# Patient Record
Sex: Female | Born: 1940 | Race: White | Hispanic: No | Marital: Single | Smoking: Current every day smoker
Health system: Southern US, Community
[De-identification: ages and names within clinical notes are randomized; demographics above are authoritative.]

## PROBLEM LIST (undated history)

## (undated) DIAGNOSIS — F0390 Unspecified dementia without behavioral disturbance: Secondary | ICD-10-CM

## (undated) DIAGNOSIS — E119 Type 2 diabetes mellitus without complications: Secondary | ICD-10-CM

## (undated) HISTORY — PX: ABDOMINAL HYSTERECTOMY: SHX81

## (undated) HISTORY — PX: CHOLECYSTECTOMY: SHX55

## (undated) HISTORY — PX: APPENDECTOMY: SHX54

---

## 2018-10-30 ENCOUNTER — Encounter: Payer: Self-pay | Admitting: Internal Medicine

## 2018-11-05 ENCOUNTER — Other Ambulatory Visit (HOSPITAL_COMMUNITY): Payer: Self-pay | Admitting: Family Medicine

## 2018-11-05 DIAGNOSIS — Z136 Encounter for screening for cardiovascular disorders: Secondary | ICD-10-CM

## 2018-12-10 ENCOUNTER — Other Ambulatory Visit (HOSPITAL_COMMUNITY)
Admission: RE | Admit: 2018-12-10 | Discharge: 2018-12-10 | Disposition: A | Discharge status: Home / Self Care | Payer: Self-pay | Source: Other Acute Inpatient Hospital | Attending: Internal Medicine | Admitting: Internal Medicine

## 2018-12-10 DIAGNOSIS — I480 Paroxysmal atrial fibrillation: Secondary | ICD-10-CM | POA: Insufficient documentation

## 2018-12-10 DIAGNOSIS — Z7901 Long term (current) use of anticoagulants: Secondary | ICD-10-CM | POA: Insufficient documentation

## 2018-12-10 LAB — PROTIME-INR
INR: 1.4
PROTHROMBIN TIME: 17 s — AB (ref 11.4–15.2)

## 2018-12-22 ENCOUNTER — Other Ambulatory Visit (HOSPITAL_COMMUNITY)
Admission: AD | Admit: 2018-12-22 | Discharge: 2018-12-22 | Disposition: A | Discharge status: Home / Self Care | Payer: Self-pay | Source: Other Acute Inpatient Hospital | Attending: Internal Medicine | Admitting: Internal Medicine

## 2018-12-22 DIAGNOSIS — Z7901 Long term (current) use of anticoagulants: Secondary | ICD-10-CM | POA: Insufficient documentation

## 2018-12-22 LAB — PROTIME-INR
INR: 2.57
Prothrombin Time: 27.2 seconds — ABNORMAL HIGH (ref 11.4–15.2)

## 2019-01-02 ENCOUNTER — Other Ambulatory Visit (HOSPITAL_COMMUNITY)
Admission: RE | Admit: 2019-01-02 | Discharge: 2019-01-02 | Disposition: A | Discharge status: Home / Self Care | Payer: 59 | Source: Ambulatory Visit | Attending: Internal Medicine | Admitting: Internal Medicine

## 2019-01-02 DIAGNOSIS — Z029 Encounter for administrative examinations, unspecified: Secondary | ICD-10-CM | POA: Diagnosis present

## 2019-01-02 LAB — PROTIME-INR
INR: 1.2
PROTHROMBIN TIME: 15 s (ref 11.4–15.2)

## 2019-01-30 ENCOUNTER — Encounter: Payer: Self-pay | Admitting: Nurse Practitioner

## 2019-01-30 ENCOUNTER — Ambulatory Visit: Payer: Self-pay | Admitting: Nurse Practitioner

## 2019-01-30 NOTE — Progress Notes (Deleted)
Primary Care Physician:  Pearson Grippe, MD Primary Gastroenterologist:  Dr. Jena Gauss  No chief complaint on file.   HPI:   Kathleen Barnett is a 78 y.o. female who presents on referral from primary care to schedule colonoscopy.  Nurse/phone triage was deferred to office visit due to Coumadin.  Reviewed information provided with the referral including ***.  No history of previous colonoscopy in our system.  Today she states   No past medical history on file.  *** The histories are not reviewed yet. Please review them in the "History" navigator section and refresh this SmartLink.  No current outpatient medications on file.   No current facility-administered medications for this visit.     Allergies as of 01/30/2019  . (Not on File)    No family history on file.  Social History   Socioeconomic History  . Marital status: Unknown    Spouse name: Not on file  . Number of children: Not on file  . Years of education: Not on file  . Highest education level: Not on file  Occupational History  . Not on file  Social Needs  . Financial resource strain: Not on file  . Food insecurity:    Worry: Not on file    Inability: Not on file  . Transportation needs:    Medical: Not on file    Non-medical: Not on file  Tobacco Use  . Smoking status: Not on file  Substance and Sexual Activity  . Alcohol use: Not on file  . Drug use: Not on file  . Sexual activity: Not on file  Lifestyle  . Physical activity:    Days per week: Not on file    Minutes per session: Not on file  . Stress: Not on file  Relationships  . Social connections:    Talks on phone: Not on file    Gets together: Not on file    Attends religious service: Not on file    Active member of club or organization: Not on file    Attends meetings of clubs or organizations: Not on file    Relationship status: Not on file  . Intimate partner violence:    Fear of current or ex partner: Not on file    Emotionally abused:  Not on file    Physically abused: Not on file    Forced sexual activity: Not on file  Other Topics Concern  . Not on file  Social History Narrative  . Not on file    Review of Systems: General: Negative for anorexia, weight loss, fever, chills, fatigue, weakness. Eyes: Negative for vision changes.  ENT: Negative for hoarseness, difficulty swallowing , nasal congestion. CV: Negative for chest pain, angina, palpitations, dyspnea on exertion, peripheral edema.  Respiratory: Negative for dyspnea at rest, dyspnea on exertion, cough, sputum, wheezing.  GI: See history of present illness. GU:  Negative for dysuria, hematuria, urinary incontinence, urinary frequency, nocturnal urination.  MS: Negative for joint pain, low back pain.  Derm: Negative for rash or itching.  Neuro: Negative for weakness, abnormal sensation, seizure, frequent headaches, memory loss, confusion.  Psych: Negative for anxiety, depression, suicidal ideation, hallucinations.  Endo: Negative for unusual weight change.  Heme: Negative for bruising or bleeding. Allergy: Negative for rash or hives.    Physical Exam: There were no vitals taken for this visit. General:   Alert and oriented. Pleasant and cooperative. Well-nourished and well-developed.  Head:  Normocephalic and atraumatic. Eyes:  Without icterus, sclera  clear and conjunctiva pink.  Ears:  Normal auditory acuity. Mouth:  No deformity or lesions, oral mucosa pink.  Throat/Neck:  Supple, without mass or thyromegaly. Cardiovascular:  S1, S2 present without murmurs appreciated. Normal pulses noted. Extremities without clubbing or edema. Respiratory:  Clear to auscultation bilaterally. No wheezes, rales, or rhonchi. No distress.  Gastrointestinal:  +BS, soft, non-tender and non-distended. No HSM noted. No guarding or rebound. No masses appreciated.  Rectal:  Deferred  Musculoskalatal:  Symmetrical without gross deformities. Normal posture. Skin:  Intact without  significant lesions or rashes. Neurologic:  Alert and oriented x4;  grossly normal neurologically. Psych:  Alert and cooperative. Normal mood and affect. Heme/Lymph/Immune: No significant cervical adenopathy. No excessive bruising noted.    01/30/2019 7:55 AM   Disclaimer: This note was dictated with voice recognition software. Similar sounding words can inadvertently be transcribed and may not be corrected upon review.

## 2019-02-04 ENCOUNTER — Telehealth: Payer: Self-pay

## 2019-02-04 NOTE — Telephone Encounter (Signed)
Pt called due to receiving a letter about a missed appointment. Pt wasn't aware that she had an appointment and wants to r/s. Call [phone]. Pt wasn't sure of her phone number but asked me to call the number back that she called from. Pt will call back if she hasn't heard from our office.

## 2019-08-09 ENCOUNTER — Emergency Department (HOSPITAL_COMMUNITY)
Admission: EM | Admit: 2019-08-09 | Discharge: 2019-08-09 | Disposition: A | Discharge status: Home / Self Care | Payer: Medicare Other | Attending: Emergency Medicine | Admitting: Emergency Medicine

## 2019-08-09 ENCOUNTER — Encounter (HOSPITAL_COMMUNITY): Payer: Self-pay | Admitting: Emergency Medicine

## 2019-08-09 ENCOUNTER — Other Ambulatory Visit: Payer: Self-pay

## 2019-08-09 ENCOUNTER — Emergency Department (HOSPITAL_COMMUNITY): Payer: Medicare Other

## 2019-08-09 DIAGNOSIS — Z7984 Long term (current) use of oral hypoglycemic drugs: Secondary | ICD-10-CM | POA: Insufficient documentation

## 2019-08-09 DIAGNOSIS — Z79899 Other long term (current) drug therapy: Secondary | ICD-10-CM | POA: Diagnosis not present

## 2019-08-09 DIAGNOSIS — R002 Palpitations: Secondary | ICD-10-CM | POA: Insufficient documentation

## 2019-08-09 DIAGNOSIS — F17210 Nicotine dependence, cigarettes, uncomplicated: Secondary | ICD-10-CM | POA: Diagnosis not present

## 2019-08-09 DIAGNOSIS — N3000 Acute cystitis without hematuria: Secondary | ICD-10-CM | POA: Diagnosis not present

## 2019-08-09 DIAGNOSIS — R42 Dizziness and giddiness: Secondary | ICD-10-CM

## 2019-08-09 DIAGNOSIS — E119 Type 2 diabetes mellitus without complications: Secondary | ICD-10-CM | POA: Insufficient documentation

## 2019-08-09 DIAGNOSIS — R079 Chest pain, unspecified: Secondary | ICD-10-CM | POA: Insufficient documentation

## 2019-08-09 HISTORY — DX: Type 2 diabetes mellitus without complications: E11.9

## 2019-08-09 LAB — URINALYSIS, ROUTINE W REFLEX MICROSCOPIC
Bacteria, UA: NONE SEEN
Glucose, UA: NEGATIVE mg/dL
Hgb urine dipstick: NEGATIVE
Ketones, ur: 5 mg/dL — AB
Nitrite: NEGATIVE
Protein, ur: 30 mg/dL — AB
Specific Gravity, Urine: 1.036 — ABNORMAL HIGH (ref 1.005–1.030)
pH: 5 (ref 5.0–8.0)

## 2019-08-09 LAB — BASIC METABOLIC PANEL
Anion gap: 9 (ref 5–15)
BUN: 20 mg/dL (ref 8–23)
CO2: 24 mmol/L (ref 22–32)
Calcium: 8.9 mg/dL (ref 8.9–10.3)
Chloride: 105 mmol/L (ref 98–111)
Creatinine, Ser: 1.06 mg/dL — ABNORMAL HIGH (ref 0.44–1.00)
GFR calc Af Amer: 59 mL/min — ABNORMAL LOW (ref >60)
GFR calc non Af Amer: 51 mL/min — ABNORMAL LOW (ref >60)
Glucose, Bld: 186 mg/dL — ABNORMAL HIGH (ref 70–99)
Potassium: 3.4 mmol/L — ABNORMAL LOW (ref 3.5–5.1)
Sodium: 138 mmol/L (ref 135–145)

## 2019-08-09 LAB — CBC
HCT: 39.5 % (ref 36.0–46.0)
Hemoglobin: 12.7 g/dL (ref 12.0–15.0)
MCH: 28 pg (ref 26.0–34.0)
MCHC: 32.2 g/dL (ref 30.0–36.0)
MCV: 87 fL (ref 80.0–100.0)
Platelets: 226 10*3/uL (ref 150–400)
RBC: 4.54 MIL/uL (ref 3.87–5.11)
RDW: 13.6 % (ref 11.5–15.5)
WBC: 8.1 10*3/uL (ref 4.0–10.5)
nRBC: 0 % (ref 0.0–0.2)

## 2019-08-09 LAB — PROTIME-INR
INR: 1.8 — ABNORMAL HIGH (ref 0.8–1.2)
Prothrombin Time: 20.4 seconds — ABNORMAL HIGH (ref 11.4–15.2)

## 2019-08-09 LAB — TROPONIN I (HIGH SENSITIVITY): Troponin I (High Sensitivity): 6 ng/L (ref <18)

## 2019-08-09 MED ORDER — SULFAMETHOXAZOLE-TRIMETHOPRIM 800-160 MG PO TABS: 1.0000 | ORAL_TABLET | Freq: Two times a day (BID) | ORAL | 0 refills | Status: DC

## 2019-08-09 MED ORDER — WARFARIN SODIUM 6 MG PO TABS
6.0000 mg | ORAL_TABLET | Freq: Once | ORAL | Status: AC
  Administered 2019-08-09: 6 mg via ORAL
  Filled 2019-08-09: qty 1
  Filled 2019-08-09: qty 3

## 2019-08-09 MED ORDER — CEPHALEXIN 500 MG PO CAPS
500.0000 mg | ORAL_CAPSULE | Freq: Once | ORAL | Status: AC
  Administered 2019-08-09: 23:00:00 500 mg via ORAL
  Filled 2019-08-09: qty 1

## 2019-08-09 MED ORDER — SODIUM CHLORIDE 0.9% FLUSH: 3.0000 mL | Freq: Once | INTRAVENOUS | Status: DC

## 2019-08-09 MED ORDER — WARFARIN - PHYSICIAN DOSING INPATIENT: Freq: Every day | Status: DC

## 2019-08-09 MED ORDER — CEPHALEXIN 500 MG PO CAPS: 500.0000 mg | ORAL_CAPSULE | Freq: Two times a day (BID) | ORAL | 0 refills | Status: DC

## 2019-08-09 NOTE — ED Triage Notes (Signed)
Pt reports she has been dizzy for more or less one month  Reports CP off and on x 1 week   Has not seen a Doctor since Dr Everette Rank whom she refers as her physician   Here "because my daughter made me come"

## 2019-08-09 NOTE — ED Notes (Signed)
No pain and no complaints other than being ready to go

## 2019-08-09 NOTE — ED Provider Notes (Signed)
[facility] EMERGENCY DEPARTMENT Provider Note   CSN: [id number] Arrival date & time: 08/09/19  1247     History   Chief Complaint Chief Complaint  Patient presents with  . Dizziness    HPI Kathleen Barnett is a 78 y.o. 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  She describes a 2 to 3-minute episode of dizziness which improves with these positional changes.  Daughter at the bedside concurs that this has been a chronic issue for her mother.  She does take meclizine which is sometimes helpful.  More urgently, has had a 2 to 3-day history of intermittent chest pain described as a mild palpitation which she states radiates into her right arm and is present for 30 minutes or less.  She has found no triggers for this episode, this is not associated with dizzy episodes, she also denies shortness of breath, diaphoresis nausea or vomiting with these episodes. Episodes are not triggered by exertion.   The last occurrence was earlier this morning prior to arriving here.     The history is provided by the patient and a relative (dg Melissa at bedside).    Past Medical History:  Diagnosis Date  . Diabetes mellitus without complication (HCC)     There are no active problems to display for this patient.   Past Surgical History:  Procedure Laterality Date  . ABDOMINAL HYSTERECTOMY    . APPENDECTOMY    . CHOLECYSTECTOMY       OB History   No obstetric history on file.      Home Medications    Prior to Admission medications   Medication Sig Start Date End Date Taking? Authorizing Provider  atorvastatin (LIPITOR) 20 MG tablet Take 20 mg by mouth daily.   Yes [provider]  isosorbide mononitrate  (IMDUR) 30 MG 24 hr tablet Take 30 mg by mouth every morning.   Yes [provider]  loratadine (CLARITIN) 10 MG tablet Take 10 mg by mouth daily.   Yes [provider]  meclizine (ANTIVERT) 25 MG tablet Take 25 mg by mouth 3 (three) times daily as needed for dizziness.   Yes [provider]  metFORMIN (GLUCOPHAGE) 500 MG tablet Take 500 mg by mouth 2 (two) times daily with a meal.   Yes [provider]  omeprazole (PRILOSEC) 40 MG capsule Take 40 mg by mouth daily.   Yes [provider]  traMADol (ULTRAM) 50 MG tablet Take 50 mg by mouth every 6 (six) hours as needed.   Yes [provider]  traZODone (DESYREL) 150 MG tablet Take 150 mg by mouth at bedtime.   Yes [provider]  cephALEXin (KEFLEX) 500 MG capsule Take 1 capsule (500 mg total) by mouth 2 (two) times daily. 08/09/19   Burgess AmorIdol, Arnav Cregg, PA-C    Family History No family history on file.  Social History Social History   Tobacco Use  . Smoking status: Current Every Day Smoker    Packs/day: 0.50    Types: Cigarettes  . Smokeless tobacco: Never Used  Substance Use Topics  . Alcohol use: Not Currently  . Drug use: Never     Allergies   Patient has no known allergies.   Review of Systems Review of  Systems  Constitutional: Negative for diaphoresis and fever.  HENT: Negative for congestion and sore throat.   Eyes: Negative.   Respiratory: Negative for shortness of breath.   Cardiovascular: Positive for chest pain and palpitations.  Gastrointestinal: Negative for abdominal pain, nausea and vomiting.  Genitourinary: Negative.   Musculoskeletal: Negative for arthralgias, joint swelling and neck pain.  Skin: Negative.  Negative for rash and wound.  Neurological: Positive for dizziness. Negative for seizures, speech difficulty, weakness, light-headedness, numbness and headaches.  Psychiatric/Behavioral: Negative.      Physical Exam Updated Vital Signs BP (!)  113/98   Pulse 78   Temp 97.7 F (36.5 C) (Oral)   Resp 18   Ht 5\' 7"  (1.702 m)   Wt 93.4 kg   SpO2 96%   BMI 32.26 kg/m   Physical Exam Vitals signs and nursing note reviewed.  Constitutional:      Appearance: She is well-developed.  HENT:     Head: Normocephalic and atraumatic.  Eyes:     Conjunctiva/sclera: Conjunctivae normal.  Neck:     Musculoskeletal: Normal range of motion.  Cardiovascular:     Rate and Rhythm: Normal rate and regular rhythm.     Heart sounds: Normal heart sounds. No murmur.  Pulmonary:     Effort: Pulmonary effort is normal.     Breath sounds: Normal breath sounds. No wheezing.  Abdominal:     General: Bowel sounds are normal.     Palpations: Abdomen is soft.     Tenderness: There is no abdominal tenderness.  Musculoskeletal: Normal range of motion.     Right lower leg: No edema.     Left lower leg: No edema.  Skin:    General: Skin is warm and dry.  Neurological:     Mental Status: She is alert.      ED Treatments / Results  Labs (all labs ordered are listed, but only abnormal results are displayed) Labs Reviewed  BASIC METABOLIC PANEL - Abnormal; Notable for the following components:      Result Value   Potassium 3.4 (*)    Glucose, Bld 186 (*)    Creatinine, Ser 1.06 (*)    GFR calc non Af Amer 51 (*)    GFR calc Af Amer 59 (*)    All other components within normal limits  URINALYSIS, ROUTINE W REFLEX MICROSCOPIC - Abnormal; Notable for the following components:   Color, Urine AMBER (*)    APPearance HAZY (*)    Specific Gravity, Urine 1.036 (*)    Bilirubin Urine SMALL (*)    Ketones, ur 5 (*)    Protein, ur 30 (*)    Leukocytes,Ua MODERATE (*)    All other components within normal limits  PROTIME-INR - Abnormal; Notable for the following components:   Prothrombin Time 20.4 (*)    INR 1.8 (*)    All other components within normal limits  URINE CULTURE  CBC  TROPONIN I (HIGH SENSITIVITY)    EKG EKG Interpretation   Date/Time:  Saturday August 09 2019 18:17:47 EDT Ventricular Rate:  86 PR Interval:    QRS Duration: 88 QT Interval:  356 QTC Calculation: 426 R Axis:   -23 Text Interpretation:  Sinus rhythm Atrial premature complex Borderline left axis deviation Low voltage, precordial leads Artifact Confirmed by Fredia Sorrow [phone]) on 08/09/2019 6:35:34 PM   Radiology Dg Chest Portable 1 View  Result Date: 08/09/2019 CLINICAL DATA:  Chest pain, dizziness EXAM: PORTABLE CHEST 1 VIEW COMPARISON:  None. FINDINGS: Cardiomegaly. Both lungs are clear. Benign calcified pulmonary nodules of the right upper lobe. The visualized skeletal structures are unremarkable. IMPRESSION: Cardiomegaly without acute abnormality of the lungs in AP portable projection. Electronically Signed   By: Lauralyn PrimesAlex  Bibbey M.D.   On: 08/09/2019 18:19    Procedures Procedures (including critical care time)  Medications Ordered in ED Medications  sodium chloride flush (NS) 0.9 % injection 3 mL (has no administration in time range)  Warfarin - Physician Dosing Inpatient (has no administration in time range)  warfarin (COUMADIN) tablet 6 mg (6 mg Oral Given 08/09/19 2259)  cephALEXin (KEFLEX) capsule 500 mg (500 mg Oral Given 08/09/19 2259)     Initial Impression / Assessment and Plan / ED Course  I have reviewed the triage vital signs and the nursing notes.  Pertinent labs & imaging results that were available during my care of the patient were reviewed by me and considered in my medical decision making (see chart for details).        Interpretation of labs revealing for UTI, glucose 186, no anion gap is present.  High-sensitivity troponin is negative at 6, pain that has been intermittent for several days, none this am.  Sx free here.  Her INR is subtherapeutic at 1.8.  She denies any missed doses, takes 5 mg coumadin qhs.  Pt was given 6 mg here, advised to take 5 mg tomorrow and Monday, plan close f/u with pcp early this week  for a recheck of her INR and sx.  Keflex prescribed for uti.  Pt seen by Dr. Deretha EmoryZackowski prior to dc home.   Final Clinical Impressions(s) / ED Diagnoses   Final diagnoses:  Chest pain, unspecified type  Dizziness  Acute cystitis without hematuria    ED Discharge Orders         Ordered    sulfamethoxazole-trimethoprim (BACTRIM DS) 800-160 MG tablet  2 times daily,   Status:  Discontinued     08/09/19 2207    cephALEXin (KEFLEX) 500 MG capsule  2 times daily     08/09/19 2227           Burgess Amordol, Cova Knieriem, Cordelia Poche-C 08/10/19 0139    Vanetta MuldersZackowski, Scott, MD 08/16/19 1231

## 2019-08-09 NOTE — ED Notes (Signed)
EKG in triage to [facility]

## 2019-08-09 NOTE — ED Notes (Signed)
Pt said that she is ready to go, told pt that we are waiting for troponin to come back

## 2019-08-09 NOTE — ED Notes (Signed)
Pt wanting to leave -

## 2019-08-09 NOTE — Discharge Instructions (Addendum)
Take the entire course of the antibiotic prescribed for your urinary infection.  Make sure you are drinking plenty of fluids. Call your doctor for an office visit this week for a recheck of your symptoms.  Your lab tests, xrays and ekg are reassuring.  You do have a mild urinary infection which should resolve with the antibiotics prescribed.  Your coumadin level is subtherapeutic this evening as your INR is 1.8.  You have been given a dose of 6 mg this evening.  Return to your regular dose of 5 mg tomorrow evening and plan to have your INR rechecked by your doctor on Monday or Tuesday.

## 2019-08-09 NOTE — ED Notes (Signed)
Called [facility] for med  

## 2019-08-09 NOTE — ED Provider Notes (Signed)
Medical screening examination/treatment/procedure(s) were conducted as a shared visit with non-physician practitioner(s) and myself.  I personally evaluated the patient during the encounter.  EKG Interpretation  Date/Time:  Saturday August 09 2019 18:17:47 EDT Ventricular Rate:  86 PR Interval:    QRS Duration: 88 QT Interval:  356 QTC Calculation: 426 R Axis:   -23 Text Interpretation:  Sinus rhythm Atrial premature complex Borderline left axis deviation Low voltage, precordial leads Artifact Confirmed by Fredia Sorrow [phone]) on 08/09/2019 6:35:34 PM   Patient seen by me along with the physician assistant.  Patient followed by [facility].  Patient's been having some dizziness for at least a month.  Some chest pain on and off for a week.  Work-up here tonight without any acute findings.  Patient needs to follow back up with her doctor.  Probably needs consideration for MRI.  Also patient is on Coumadin and INR is subtherapeutic here.  Will need some adjustments on that.  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  And she is on that was done in [place] and she is on the blood thinner for that reason.  Patient nontoxic no acute distress patient can be discharged home.  Initial troponin here was 6.  Since the pains been going on for a week feel patient requires a delta troponin.  Urinalysis showed some concentration but no positive nitrite.  But did have 21-50 white blood cells.  Not many squamous epithelial.  Would recommend empiric treatment for urinary tract infection and send urine culture.  Follow-up with her regular doctor.   Fredia Sorrow, MD 08/09/19 2206

## 2019-08-11 LAB — URINE CULTURE

## 2019-09-01 ENCOUNTER — Other Ambulatory Visit: Payer: Self-pay | Admitting: Neurology

## 2019-09-01 DIAGNOSIS — F0280 Dementia in other diseases classified elsewhere without behavioral disturbance: Secondary | ICD-10-CM

## 2019-09-04 ENCOUNTER — Ambulatory Visit (HOSPITAL_COMMUNITY)
Admission: RE | Admit: 2019-09-04 | Discharge: 2019-09-04 | Disposition: A | Discharge status: Home / Self Care | Payer: Medicare Other | Source: Ambulatory Visit | Attending: Neurology | Admitting: Neurology

## 2019-09-04 ENCOUNTER — Other Ambulatory Visit: Payer: Self-pay

## 2019-09-04 DIAGNOSIS — F0280 Dementia in other diseases classified elsewhere without behavioral disturbance: Secondary | ICD-10-CM | POA: Insufficient documentation

## 2019-09-04 DIAGNOSIS — G3183 Dementia with Lewy bodies: Secondary | ICD-10-CM | POA: Diagnosis not present

## 2019-12-22 NOTE — H&P (Signed)
Surgical History & Physical  Patient Name: Kathleen Barnett DOB: 1941-02-28  Surgery: Cataract extraction with intraocular lens implant phacoemulsification; Right Eye  Surgeon: Fabio Pierce MD Surgery Date:  12/29/2019 Pre-Op Date:  12/15/2019  HPI: A 27 Yr. old female patient 1. 1. The patient complains of difficulty when viewing TV, reading closed caption, news scrolls on TV, which began many years ago. Both eyes are affected. The episode is gradual. The condition's severity increased since last visit. Symptoms occur when the patient is inside and outside. The complaint is associated with glare. This is negatively affecting the patient's quality of life. HPI Completed by Dr. Fabio Pierce  Medical History: Cataracts Arthritis Depression, Parkinson, Lewy Body Dementia Diabetes High Blood Pressure LDL  Review of Systems Negative Allergic/Immunologic Negative Cardiovascular Negative Constitutional Negative Ear, Nose, Mouth & Throat Negative Endocrine Negative Gastrointestinal Negative Genitourinary Negative Hemotologic/Lymphatic Negative Integumentary Negative Musculoskeletal Negative Neurological Negative Psychiatry Negative Respiratory  Social   Current every day smoker  Medication Aricept oral, Atorvastatin, Isosorbide mononitrate, Meclizine, Naproxen, Omeprazole, Tramadol hydrochloride, Warfarin, Albuterol, Donepezil,   Sx/Procedures  None  Drug Allergies   NKDA : History & Physical: Heent:  Cataract, Right eye NECK: supple without bruits LUNGS: lungs clear to auscultation CV: regular rate and rhythm Abdomen: soft and non-tender  Impression & Plan: Assessment: 1.  COMBINED FORMS AGE RELATED CATARACT; Both Eyes (H25.813)  Plan: 1.  Cataract accounts for the patient's decreased vision. This visual impairment is not correctable with a tolerable change in glasses or contact lenses. Cataract surgery with an implantation of a new lens should significantly improve  the visual and functional status of the patient. Discussed all risks, benefits, alternatives, and potential complications. Discussed the procedures and recovery. Patient desires to have surgery. A-scan ordered and performed today for intra-ocular lens calculations. The surgery will be performed in order to improve vision for driving, reading, and for eye examinations. Recommend phacoemulsification with intra-ocular lens. Right Eye worse per patient - first. Dilates well - shugarcaine by protocol.

## 2019-12-24 NOTE — Patient Instructions (Signed)
Kathleen Barnett  12/24/2019     @PREFPERIOPPHARMACY @   Your procedure is scheduled on  12/29/2019   Report to [facility] at  [facility]  A.M.  Call this number if you have problems the morning of surgery:  [phone]   Remember:  Do not eat or drink after midnight.                     Take these medicines the morning of surgery with A SIP OF WATER  Isosorbide, claritin, antivert(if needed), omeprazole, tramadol(if needed)/    Do not wear jewelry, make-up or nail polish.  Do not wear lotions, powders, or perfumes. Please wear deodorant and brush your teeth.  Do not shave 48 hours prior to surgery.  Men may shave face and neck.  Do not bring valuables to the hospital.  [facility] is not responsible for any belongings or valuables.  Contacts, dentures or bridgework may not be worn into surgery.  Leave your suitcase in the car.  After surgery it may be brought to your room.  For patients admitted to the hospital, discharge time will be determined by your treatment team.  Patients discharged the day of surgery will not be allowed to drive home.   Name and phone number of your driver:   family Special instructions:  None  Please read over the following fact sheets that you were given. Anesthesia Post-op Instructions and Care and Recovery After Surgery       Cataract Surgery, Care After This sheet gives you information about how to care for yourself after your procedure. Your health care provider may also give you more specific instructions. If you have problems or questions, contact your health care provider. What can I expect after the procedure? After the procedure, it is common to have:  Itching.  Discomfort.  Fluid discharge.  Sensitivity to light and to touch.  Bruising in or around the eye.  Mild blurred vision. Follow these instructions at home: Eye care   Do not touch or rub your eyes.  Protect your eyes as told by your health care provider. You may  be told to wear a protective eye shield or sunglasses.  Do not put a contact lens into the affected eye or eyes until your health care provider approves.  Keep the area around your eye clean and dry: ? Avoid swimming. ? Do not allow water to hit you directly in the face while showering. ? Keep soap and shampoo out of your eyes.  Check your eye every day for signs of infection. Watch for: ? Redness, swelling, or pain. ? Fluid, blood, or pus. ? Warmth. ? A bad smell. ? Vision that is getting worse. ? Sensitivity that is getting worse. Activity  Do not drive for 24 hours if you were given a sedative during your procedure.  Avoid strenuous activities, such as playing contact sports, for as long as told by your health care provider.  Do not drive or use heavy machinery until your health care provider approves.  Do not bend or lift heavy objects. Bending increases pressure in the eye. You can walk, climb stairs, and do light household chores.  Ask your health care provider when you can return to work. If you work in a dusty environment, you may be advised to wear protective eyewear for a period of time. General instructions  Take or apply over-the-counter and prescription medicines only as told by your health  care provider. This includes eye drops.  Keep all follow-up visits as told by your health care provider. This is important. Contact a health care provider if:  You have increased bruising around your eye.  You have pain that is not helped with medicine.  You have a fever.  You have redness, swelling, or pain in your eye.  You have fluid, blood, or pus coming from your incision.  Your vision gets worse.  Your sensitivity to light gets worse. Get help right away if:  You have sudden loss of vision.  You see flashes of light or spots (floaters).  You have severe eye pain.  You develop nausea or vomiting. Summary  After your procedure, it is common to have  itching, discomfort, bruising, fluid discharge, or sensitivity to light.  Follow instructions from your health care provider about caring for your eye after the procedure.  Do not rub your eye after the procedure. You may need to wear eye protection or sunglasses. Do not wear contact lenses. Keep the area around your eye clean and dry.  Avoid activities that require a lot of effort. These include playing sports and lifting heavy objects.  Contact a health care provider if you have increased bruising, pain that does not go away, or a fever. Get help right away if you suddenly lose your vision, see flashes of light or spots, or have severe pain in the eye. This information is not intended to replace advice given to you by your health care provider. Make sure you discuss any questions you have with your health care provider. Document Revised: 09/30/2019 Document Reviewed: 06/03/2018 Elsevier Patient Education  2020 [employer] Monitored Anesthesia Care, Care After These instructions provide you with information about caring for yourself after your procedure. Your health care provider may also give you more specific instructions. Your treatment has been planned according to current medical practices, but problems sometimes occur. Call your health care provider if you have any problems or questions after your procedure. What can I expect after the procedure? After your procedure, you may:  Feel sleepy for several hours.  Feel clumsy and have poor balance for several hours.  Feel forgetful about what happened after the procedure.  Have poor judgment for several hours.  Feel nauseous or vomit.  Have a sore throat if you had a breathing tube during the procedure. Follow these instructions at home: For at least 24 hours after the procedure:      Have a responsible adult stay with you. It is important to have someone help care for you until you are awake and alert.  Rest as needed.  Do  not: ? Participate in activities in which you could fall or become injured. ? Drive. ? Use heavy machinery. ? Drink alcohol. ? Take sleeping pills or medicines that cause drowsiness. ? Make important decisions or sign legal documents. ? Take care of children on your own. Eating and drinking  Follow the diet that is recommended by your health care provider.  If you vomit, drink water, juice, or soup when you can drink without vomiting.  Make sure you have little or no nausea before eating solid foods. General instructions  Take over-the-counter and prescription medicines only as told by your health care provider.  If you have sleep apnea, surgery and certain medicines can increase your risk for breathing problems. Follow instructions from your health care provider about wearing your sleep device: ? Anytime you are sleeping, including during daytime naps. ?  While taking prescription pain medicines, sleeping medicines, or medicines that make you drowsy.  If you smoke, do not smoke without supervision.  Keep all follow-up visits as told by your health care provider. This is important. Contact a health care provider if:  You keep feeling nauseous or you keep vomiting.  You feel light-headed.  You develop a rash.  You have a fever. Get help right away if:  You have trouble breathing. Summary  For several hours after your procedure, you may feel sleepy and have poor judgment.  Have a responsible adult stay with you for at least 24 hours or until you are awake and alert. This information is not intended to replace advice given to you by your health care provider. Make sure you discuss any questions you have with your health care provider. Document Revised: 03/04/2018 Document Reviewed: 03/26/2016 Elsevier Patient Education  [place].

## 2019-12-25 ENCOUNTER — Other Ambulatory Visit: Payer: Self-pay

## 2019-12-25 ENCOUNTER — Other Ambulatory Visit (HOSPITAL_COMMUNITY)
Admission: RE | Admit: 2019-12-25 | Discharge: 2019-12-25 | Disposition: A | Discharge status: Home / Self Care | Payer: Medicare Other | Source: Ambulatory Visit | Attending: Ophthalmology | Admitting: Ophthalmology

## 2019-12-25 ENCOUNTER — Encounter (HOSPITAL_COMMUNITY)
Admission: RE | Admit: 2019-12-25 | Discharge: 2019-12-25 | Disposition: A | Discharge status: Home / Self Care | Payer: Medicare Other | Source: Ambulatory Visit | Attending: Ophthalmology | Admitting: Ophthalmology

## 2019-12-25 DIAGNOSIS — Z01812 Encounter for preprocedural laboratory examination: Secondary | ICD-10-CM | POA: Insufficient documentation

## 2019-12-25 DIAGNOSIS — Z20822 Contact with and (suspected) exposure to covid-19: Secondary | ICD-10-CM | POA: Diagnosis not present

## 2019-12-25 LAB — SARS CORONAVIRUS 2 (TAT 6-24 HRS): SARS Coronavirus 2: NEGATIVE

## 2019-12-26 MED ORDER — NEOMYCIN-POLYMYXIN-DEXAMETH 3.5-10000-0.1 OP SUSP
OPHTHALMIC | Status: AC
  Filled 2019-12-26: qty 5

## 2019-12-26 MED ORDER — LIDOCAINE HCL 3.5 % OP GEL
OPHTHALMIC | Status: AC
  Filled 2019-12-26: qty 1

## 2019-12-26 MED ORDER — LIDOCAINE HCL (PF) 1 % IJ SOLN
INTRAMUSCULAR | Status: AC
  Filled 2019-12-26: qty 2

## 2019-12-26 MED ORDER — TETRACAINE HCL 0.5 % OP SOLN
OPHTHALMIC | Status: AC
  Filled 2019-12-26: qty 4

## 2019-12-26 MED ORDER — PHENYLEPHRINE HCL 2.5 % OP SOLN
OPHTHALMIC | Status: AC
  Filled 2019-12-26: qty 15

## 2019-12-26 MED ORDER — CYCLOPENTOLATE-PHENYLEPHRINE 0.2-1 % OP SOLN
OPHTHALMIC | Status: AC
  Filled 2019-12-26: qty 2

## 2019-12-26 NOTE — Pre-Procedure Instructions (Signed)
Unable to come for labs. Dr Lemont Fillers reviewed chart and states we can use her BMet from 08/09/2019 and do not need to repeat BMet or cancel surgery.

## 2019-12-29 ENCOUNTER — Ambulatory Visit (HOSPITAL_COMMUNITY)
Admission: RE | Admit: 2019-12-29 | Discharge: 2019-12-29 | Disposition: A | Discharge status: Home / Self Care | Payer: Medicare Other | Attending: Ophthalmology | Admitting: Ophthalmology

## 2019-12-29 ENCOUNTER — Ambulatory Visit (HOSPITAL_COMMUNITY): Payer: Medicare Other | Admitting: Anesthesiology

## 2019-12-29 ENCOUNTER — Encounter (HOSPITAL_COMMUNITY)
Admission: RE | Disposition: A | Discharge status: Home / Self Care | Payer: Self-pay | Source: Home / Self Care | Attending: Ophthalmology

## 2019-12-29 ENCOUNTER — Encounter (HOSPITAL_COMMUNITY): Payer: Self-pay | Admitting: Ophthalmology

## 2019-12-29 ENCOUNTER — Other Ambulatory Visit: Payer: Self-pay

## 2019-12-29 DIAGNOSIS — I10 Essential (primary) hypertension: Secondary | ICD-10-CM | POA: Diagnosis not present

## 2019-12-29 DIAGNOSIS — F17200 Nicotine dependence, unspecified, uncomplicated: Secondary | ICD-10-CM | POA: Insufficient documentation

## 2019-12-29 DIAGNOSIS — E1136 Type 2 diabetes mellitus with diabetic cataract: Secondary | ICD-10-CM | POA: Insufficient documentation

## 2019-12-29 DIAGNOSIS — H25813 Combined forms of age-related cataract, bilateral: Secondary | ICD-10-CM | POA: Diagnosis not present

## 2019-12-29 DIAGNOSIS — Z7984 Long term (current) use of oral hypoglycemic drugs: Secondary | ICD-10-CM | POA: Diagnosis not present

## 2019-12-29 DIAGNOSIS — G20 Parkinson's disease: Secondary | ICD-10-CM | POA: Diagnosis not present

## 2019-12-29 DIAGNOSIS — F0280 Dementia in other diseases classified elsewhere without behavioral disturbance: Secondary | ICD-10-CM | POA: Diagnosis not present

## 2019-12-29 DIAGNOSIS — Z7901 Long term (current) use of anticoagulants: Secondary | ICD-10-CM | POA: Diagnosis not present

## 2019-12-29 DIAGNOSIS — Z79899 Other long term (current) drug therapy: Secondary | ICD-10-CM | POA: Diagnosis not present

## 2019-12-29 HISTORY — PX: CATARACT EXTRACTION W/PHACO: SHX586

## 2019-12-29 LAB — POCT I-STAT, CHEM 8
BUN: 17 mg/dL (ref 8–23)
Calcium, Ion: 1.15 mmol/L (ref 1.15–1.40)
Chloride: 101 mmol/L (ref 98–111)
Creatinine, Ser: 0.8 mg/dL (ref 0.44–1.00)
Glucose, Bld: 125 mg/dL — ABNORMAL HIGH (ref 70–99)
HCT: 38 % (ref 36.0–46.0)
Hemoglobin: 12.9 g/dL (ref 12.0–15.0)
Potassium: 3.6 mmol/L (ref 3.5–5.1)
Sodium: 140 mmol/L (ref 135–145)
TCO2: 27 mmol/L (ref 22–32)

## 2019-12-29 SURGERY — PHACOEMULSIFICATION, CATARACT, WITH IOL INSERTION
Anesthesia: Monitor Anesthesia Care | Site: Eye | Laterality: Right

## 2019-12-29 MED ORDER — PROVISC 10 MG/ML IO SOLN
INTRAOCULAR | Status: DC | PRN
  Administered 2019-12-29: 0.85 mL via INTRAOCULAR

## 2019-12-29 MED ORDER — HYDROMORPHONE HCL 1 MG/ML IJ SOLN: 0.2500 mg | INTRAMUSCULAR | Status: DC | PRN

## 2019-12-29 MED ORDER — NEOMYCIN-POLYMYXIN-DEXAMETH 3.5-10000-0.1 OP SUSP
OPHTHALMIC | Status: DC | PRN
  Administered 2019-12-29: 1 [drp] via OPHTHALMIC

## 2019-12-29 MED ORDER — MIDAZOLAM HCL 2 MG/2ML IJ SOLN: 0.5000 mg | Freq: Once | INTRAMUSCULAR | Status: DC | PRN

## 2019-12-29 MED ORDER — PROMETHAZINE HCL 25 MG/ML IJ SOLN: 6.2500 mg | INTRAMUSCULAR | Status: DC | PRN

## 2019-12-29 MED ORDER — PHENYLEPHRINE HCL 2.5 % OP SOLN
1.0000 [drp] | OPHTHALMIC | Status: AC | PRN
  Administered 2019-12-29 (×3): 1 [drp] via OPHTHALMIC

## 2019-12-29 MED ORDER — SODIUM HYALURONATE 23 MG/ML IO SOLN
INTRAOCULAR | Status: DC | PRN
  Administered 2019-12-29: 0.6 mL via INTRAOCULAR

## 2019-12-29 MED ORDER — BSS IO SOLN
INTRAOCULAR | Status: DC | PRN
  Administered 2019-12-29: 15 mL via INTRAOCULAR

## 2019-12-29 MED ORDER — POVIDONE-IODINE 5 % OP SOLN
OPHTHALMIC | Status: DC | PRN
  Administered 2019-12-29: 1 via OPHTHALMIC

## 2019-12-29 MED ORDER — EPINEPHRINE PF 1 MG/ML IJ SOLN
INTRAOCULAR | Status: DC | PRN
  Administered 2019-12-29: 09:00:00 500 mL

## 2019-12-29 MED ORDER — SODIUM CHLORIDE 0.9% FLUSH
10.0000 mL | INTRAVENOUS | Status: DC | PRN
  Administered 2019-12-29 (×2): 3 mL via INTRAVENOUS

## 2019-12-29 MED ORDER — LIDOCAINE HCL (PF) 1 % IJ SOLN
INTRAOCULAR | Status: DC | PRN
  Administered 2019-12-29: 09:00:00 .9 mL via OPHTHALMIC

## 2019-12-29 MED ORDER — HYDROCODONE-ACETAMINOPHEN 7.5-325 MG PO TABS: 1.0000 | ORAL_TABLET | Freq: Once | ORAL | Status: DC | PRN

## 2019-12-29 MED ORDER — LIDOCAINE HCL 3.5 % OP GEL
1.0000 "application " | Freq: Once | OPHTHALMIC | Status: AC
  Administered 2019-12-29: 1 via OPHTHALMIC

## 2019-12-29 MED ORDER — TETRACAINE HCL 0.5 % OP SOLN
1.0000 [drp] | OPHTHALMIC | Status: AC | PRN
  Administered 2019-12-29 (×3): 1 [drp] via OPHTHALMIC

## 2019-12-29 MED ORDER — LACTATED RINGERS IV SOLN: INTRAVENOUS | Status: DC

## 2019-12-29 MED ORDER — MIDAZOLAM HCL 2 MG/2ML IJ SOLN
INTRAMUSCULAR | Status: AC
  Filled 2019-12-29: qty 2

## 2019-12-29 MED ORDER — CYCLOPENTOLATE-PHENYLEPHRINE 0.2-1 % OP SOLN
1.0000 [drp] | OPHTHALMIC | Status: AC | PRN
  Administered 2019-12-29 (×3): 1 [drp] via OPHTHALMIC

## 2019-12-29 MED ORDER — MIDAZOLAM HCL 2 MG/2ML IJ SOLN
INTRAMUSCULAR | Status: DC | PRN
  Administered 2019-12-29 (×2): 1 mg via INTRAVENOUS

## 2019-12-29 MED ORDER — EPINEPHRINE PF 1 MG/ML IJ SOLN
INTRAMUSCULAR | Status: AC
  Filled 2019-12-29: qty 2

## 2019-12-29 SURGICAL SUPPLY — 13 items

## 2019-12-29 NOTE — Transfer of Care (Signed)
Immediate Anesthesia Transfer of Care Note  Patient: Kathleen Barnett  Procedure(s) Performed: CATARACT EXTRACTION PHACO AND INTRAOCULAR LENS PLACEMENT RIGHT EYE (Right Eye)  Patient Location: Short Stay  Anesthesia Type:MAC  Level of Consciousness: awake, alert  and patient cooperative  Airway & Oxygen Therapy: Patient Spontanous Breathing  Post-op Assessment: Report given to RN and Post -op Vital signs reviewed and stable  Post vital signs: Reviewed and stable  Last Vitals:  Vitals Value Taken Time  BP    Temp    Pulse    Resp    SpO2      Last Pain:  Vitals:   12/29/19 0733  TempSrc: Oral         Complications: No apparent anesthesia complications

## 2019-12-29 NOTE — Anesthesia Preprocedure Evaluation (Signed)
Anesthesia Evaluation  Patient identified by MRN, date of birth, ID band Patient awake    Reviewed: Allergy & Precautions, NPO status , Patient's Chart, lab work & pertinent test results  Airway Mallampati: II  TM Distance: >3 FB Neck ROM: Full    Dental no notable dental hx. (+) Upper Dentures, Edentulous Lower   Pulmonary neg pulmonary ROS, Current Smoker and Patient abstained from smoking.,    Pulmonary exam normal breath sounds clear to auscultation       Cardiovascular Exercise Tolerance: Good negative cardio ROS Normal cardiovascular examI Rhythm:Regular Rate:Normal  On Isosorbide and kept anticoag with coumadin -unaware why  Denies recent CP/DOE   Neuro/Psych Dementia On meds  Knew person ,place,date  Thought Trump is president negative neurological ROS  negative psych ROS   GI/Hepatic negative GI ROS, Neg liver ROS,   Endo/Other  negative endocrine ROSdiabetes, Type 2, Oral Hypoglycemic Agents  Renal/GU negative Renal ROS  negative genitourinary   Musculoskeletal negative musculoskeletal ROS (+)   Abdominal   Peds negative pediatric ROS (+)  Hematology negative hematology ROS (+)   Anesthesia Other Findings   Reproductive/Obstetrics negative OB ROS                             Anesthesia Physical Anesthesia Plan  ASA: III  Anesthesia Plan: MAC   Post-op Pain Management:    Induction: Intravenous  PONV Risk Score and Plan: 1 and TIVA  Airway Management Planned: Nasal Cannula and Simple Face Mask  Additional Equipment:   Intra-op Plan:   Post-operative Plan:   Informed Consent: I have reviewed the patients History and Physical, chart, labs and discussed the procedure including the risks, benefits and alternatives for the proposed anesthesia with the patient or authorized representative who has indicated his/her understanding and acceptance.     Dental advisory  given  Plan Discussed with: CRNA  Anesthesia Plan Comments: (Plan Full PPE use  Plan MAC d/w pt  -WTP with same after Q&A)        Anesthesia Quick Evaluation

## 2019-12-29 NOTE — Anesthesia Procedure Notes (Signed)
Procedure Name: MAC Date/Time: 12/29/2019 8:36 AM Performed by: Vista Deck, CRNA Pre-anesthesia Checklist: Patient identified, Emergency Drugs available, Suction available, Timeout performed and Patient being monitored Patient Re-evaluated:Patient Re-evaluated prior to induction Oxygen Delivery Method: Nasal Cannula

## 2019-12-29 NOTE — Anesthesia Postprocedure Evaluation (Signed)
Anesthesia Post Note  Patient: Kathleen Barnett  Procedure(s) Performed: CATARACT EXTRACTION PHACO AND INTRAOCULAR LENS PLACEMENT RIGHT EYE (Right Eye)  Patient location during evaluation: Short Stay Anesthesia Type: MAC Level of consciousness: awake and alert and patient cooperative Pain management: satisfactory to patient Vital Signs Assessment: post-procedure vital signs reviewed and stable Respiratory status: spontaneous breathing Cardiovascular status: stable Postop Assessment: no apparent nausea or vomiting Anesthetic complications: no     Last Vitals:  Vitals:   12/29/19 0733 12/29/19 0902  BP: (!) 155/63 (!) 154/68  Pulse: 66 68  Resp: (!) 22 18  Temp: 36.6 C 36.6 C  SpO2: 95% 97%    Last Pain:  Vitals:   12/29/19 0902  TempSrc: Oral  PainSc: 0-No pain                 Keishia Ground

## 2019-12-29 NOTE — Interval H&P Note (Signed)
History and Physical Interval Note: The H and P was reviewed and updated. The patient was examined.  No changes were found after exam.  The surgical eye was marked.  12/29/2019 8:33 AM  Kathleen Barnett  has presented today for surgery, with the diagnosis of Nuclear sclerotic cataract - Right eye.  The various methods of treatment have been discussed with the patient and family. After consideration of risks, benefits and other options for treatment, the patient has consented to  Procedure(s) with comments: CATARACT EXTRACTION PHACO AND INTRAOCULAR LENS PLACEMENT RIGHT EYE (Right) - right as a surgical intervention.  The patient's history has been reviewed, patient examined, no change in status, stable for surgery.  I have reviewed the patient's chart and labs.  Questions were answered to the patient's satisfaction.     Fabio Pierce

## 2019-12-29 NOTE — Op Note (Signed)
Date of procedure: 12/29/19  Pre-operative diagnosis:  Visually significant combined form age-related cataract, Right Eye (H25.811)  Post-operative diagnosis:  Visually significant combined form age-related cataract, Right Eye (H25.811)  Procedure: Removal of cataract via phacoemulsification and insertion of intra-ocular lens Wynetta Emery and [employer] DCB00  +15.0D into the capsular bag of the Right Eye  Attending surgeon: Gerda Diss. Robby Pirani, MD, MA  Anesthesia: MAC, Topical Akten  Complications: None  Estimated Blood Loss: <19m (minimal)  Specimens: None  Implants: As above  Indications:  Visually significant age-related cataract, Right Eye  Procedure:  The patient was seen and identified in the pre-operative area. The operative eye was identified and dilated.  The operative eye was marked.  Topical anesthesia was administered to the operative eye.     The patient was then to the operative suite and placed in the supine position.  A timeout was performed confirming the patient, procedure to be performed, and all other relevant information.   The patient's face was prepped and draped in the usual fashion for intra-ocular surgery.  A lid speculum was placed into the operative eye and the surgical microscope moved into place and focused.  A superotemporal paracentesis was created using a 20 gauge paracentesis blade.  Shugarcaine was injected into the anterior chamber.  Viscoelastic was injected into the anterior chamber.  A temporal clear-corneal main wound incision was created using a 2.46mmicrokeratome.  A continuous curvilinear capsulorrhexis was initiated using an irrigating cystitome and completed using capsulorrhexis forceps.  Hydrodissection and hydrodeliniation were performed.  Viscoelastic was injected into the anterior chamber.  A phacoemulsification handpiece and a chopper as a second instrument were used to remove the nucleus and epinucleus. The irrigation/aspiration handpiece was  used to remove any remaining cortical material.   The capsular bag was reinflated with viscoelastic, checked, and found to be intact.  The intraocular lens was inserted into the capsular bag.  The irrigation/aspiration handpiece was used to remove any remaining viscoelastic.  The clear corneal wound and paracentesis wounds were then hydrated and checked with Weck-Cels to be watertight.  The lid-speculum and drape was removed, and the patient's face was cleaned with a wet and dry 4x4.  Maxitrol was instilled in the eye before a clear shield was taped over the eye. The patient was taken to the post-operative care unit in good condition, having tolerated the procedure well.  Post-Op Instructions: [facility] and will receive all other orders and instructions.

## 2019-12-29 NOTE — Discharge Instructions (Signed)
Please discharge patient when stable, will follow up today with Dr. Sheryl Towell at the [facility] [place] office immediately following discharge.  Leave shield in place until visit.  All paperwork with discharge instructions will be given at the office.  [facility] Address:  [address]  [place], [place] [address]             Monitored Anesthesia Care, Care After These instructions provide you with information about caring for yourself after your procedure. Your health care provider may also give you more specific instructions. Your treatment has been planned according to current medical practices, but problems sometimes occur. Call your health care provider if you have any problems or questions after your procedure. What can I expect after the procedure? After your procedure, you may:  Feel sleepy for several hours.  Feel clumsy and have poor balance for several hours.  Feel forgetful about what happened after the procedure.  Have poor judgment for several hours.  Feel nauseous or vomit.  Have a sore throat if you had a breathing tube during the procedure. Follow these instructions at home: For at least 24 hours after the procedure:      Have a responsible adult stay with you. It is important to have someone help care for you until you are awake and alert.  Rest as needed.  Do not: ? Participate in activities in which you could fall or become injured. ? Drive. ? Use heavy machinery. ? Drink alcohol. ? Take sleeping pills or medicines that cause drowsiness. ? Make important decisions or sign legal documents. ? Take care of children on your own. Eating and drinking  Follow the diet that is recommended by your health care provider.  If you vomit, drink water, juice, or soup when you can drink without vomiting.  Make sure you have little or no nausea before eating solid foods. General instructions  Take over-the-counter and  prescription medicines only as told by your health care provider.  If you have sleep apnea, surgery and certain medicines can increase your risk for breathing problems. Follow instructions from your health care provider about wearing your sleep device: ? Anytime you are sleeping, including during daytime naps. ? While taking prescription pain medicines, sleeping medicines, or medicines that make you drowsy.  If you smoke, do not smoke without supervision.  Keep all follow-up visits as told by your health care provider. This is important. Contact a health care provider if:  You keep feeling nauseous or you keep vomiting.  You feel light-headed.  You develop a rash.  You have a fever. Get help right away if:  You have trouble breathing. Summary  For several hours after your procedure, you may feel sleepy and have poor judgment.  Have a responsible adult stay with you for at least 24 hours or until you are awake and alert. This information is not intended to replace advice given to you by your health care provider. Make sure you discuss any questions you have with your health care provider. Document Revised: 03/04/2018 Document Reviewed: 03/26/2016 Elsevier Patient Education  2020 Elsevier Inc.  

## 2020-01-14 NOTE — H&P (Signed)
Surgical History & Physical  Patient Name: Kathleen Barnett DOB: [birth year]  Surgery: Cataract extraction with intraocular lens implant phacoemulsification; Left Eye  Surgeon: Fabio Pierce MD Surgery Date:  01/26/2020 Pre-Op Date:  01/08/2020  HPI: A 75 Yr. old female patient 1. 1. The patient complains of difficulty when viewing TV, reading closed caption, news scrolls on TV, which began 1 year ago. The left eye is affected. The episode is gradual. The condition's severity increased since last visit. Symptoms occur when the patient is inside and outside. This is negatively affecting the patient's quality of life. 2. The patient is returning after cataract post-op. The right eye is affected. Status post cataract post-op, 1 week ago: Since the last visit, the affected area feels improvement. The patient's vision is improved. Patient is following postop medication instructions. HPI was performed by Fabio Pierce .  Medical History: Cataracts Arthritis Depression, Parkinson, Lewy Body Dementia Diabetes High Blood Pressure LDL  Review of Systems Negative Allergic/Immunologic Negative Cardiovascular Negative Constitutional Negative Ear, Nose, Mouth & Throat Negative Endocrine Negative Gastrointestinal Negative Genitourinary Negative Hemotologic/Lymphatic Negative Integumentary Negative Musculoskeletal Negative Neurological Negative Psychiatry Negative Respiratory  Social   Current every day smoker   Medication Ilevro, Vigamox, Prednisolone acetate 1%,  Aricept oral, Atorvastatin, Isosorbide mononitrate, Meclizine, Naproxen, Omeprazole, Tramadol hydrochloride, Warfarin, Albuterol, Donepezil, Prednisolone acetate,   Sx/Procedures Phaco c IOL,   Drug Allergies   NKDA  History & Physical: Heent:  Cataract, Left eye NECK: supple without bruits LUNGS: lungs clear to auscultation CV: regular rate and rhythm Abdomen: soft and non-tender  Impression & Plan: Assessment: 1.   COMBINED FORMS AGE RELATED CATARACT; Left Eye (H25.812) 2.  CATARACT EXTRACTION STATUS; Right Eye (Z98.41)  Plan: 1.  Cataract accounts for the patient's decreased vision. This visual impairment is not correctable with a tolerable change in glasses or contact lenses. Cataract surgery with an implantation of a new lens should significantly improve the visual and functional status of the patient. Discussed all risks, benefits, alternatives, and potential complications. Discussed the procedures and recovery. Patient desires to have surgery. A-scan ordered and performed today for intra-ocular lens calculations. The surgery will be performed in order to improve vision for driving, reading, and for eye examinations. Recommend phacoemulsification with intra-ocular lens. Left Eye. Surgery required to correct imbalance of vision. Dilates well - shugarcaine by protocol. 2.  1 week after cataract surgery. Doing well with improved vision and normal eye pressure. Call with any problems or concerns. Stop Vigamox. Continue Ilevro 1 drop 1x/day for 3 more weeks. Continue Pred Acetate 1 drop 2x/day for 3 more weeks.

## 2020-01-22 ENCOUNTER — Encounter (HOSPITAL_COMMUNITY)
Admission: RE | Admit: 2020-01-22 | Discharge: 2020-01-22 | Disposition: A | Discharge status: Home / Self Care | Payer: Medicare Other | Source: Ambulatory Visit | Attending: Ophthalmology | Admitting: Ophthalmology

## 2020-01-22 ENCOUNTER — Other Ambulatory Visit: Payer: Self-pay

## 2020-01-22 NOTE — Pre-Procedure Instructions (Signed)
Have made multiple attempts to contact patient concerning her time of arrival for surgery on 01/26/2020. I called her daughter and left a message for her to call me so we can inform her of her time of arrival. Daughter is Efraim Kaufmann [phone].

## 2020-01-22 NOTE — Patient Instructions (Signed)
Your procedure is scheduled on:  01/26/2020               Report to [facility] at 9:30    AM.  Call this number if you have problems the morning of surgery: [phone]   Do not eat or drink :After Midnight.    Take these medicines the morning of surgery with A SIP OF WATER:   Tramadol, isosorbide, namenda, and omeprazole         Do not wear jewelry, make-up or nail polish.  Do not wear lotions, powders, or perfumes. You may wear deodorant.  Do not bring valuables to the hospital.  Contacts, dentures or bridgework may not be worn into surgery.  Patients discharged the day of surgery will not be allowed to drive home.  Name and phone number of your driver.                                                                                                                                       Cataract Surgery  A cataract is a clouding of the lens of the eye. When a lens becomes cloudy, vision is reduced based on the degree and nature of the clouding. Surgery may be needed to improve vision. Surgery removes the cloudy lens and usually replaces it with a substitute lens (intraocular lens, IOL). LET YOUR EYE DOCTOR KNOW ABOUT:  Allergies to food or medicine.   Medicines taken including herbs, eyedrops, over-the-counter medicines, and creams.   Use of steroids (by mouth or creams).   Previous problems with anesthetics or numbing medicine.   History of bleeding problems or blood clots.   Previous surgery.   Other health problems, including diabetes and kidney problems.   Possibility of pregnancy, if this applies.  RISKS AND COMPLICATIONS  Infection.   Inflammation of the eyeball (endophthalmitis) that can spread to both eyes (sympathetic ophthalmia).   Poor wound healing.   If an IOL is inserted, it can later fall out of proper position. This is very uncommon.   Clouding of the part of your eye that holds an IOL in place. This is called an "after-cataract." These are uncommon, but  easily treated.  BEFORE THE PROCEDURE  Do not eat or drink anything except small amounts of water for 8 to 12 before your surgery, or as directed by your caregiver.    Unless you are told otherwise, continue any eyedrops you have been prescribed.   Talk to your primary caregiver about all other medicines that you take (both prescription and non-prescription). In some cases, you may need to stop or change medicines near the time of your surgery. This is most important if you are taking blood-thinning medicine. Do not stop medicines unless you are told to do so.   Arrange for someone to drive you to and from the procedure.   Do not put contact lenses in either eye  on the day of your surgery.  PROCEDURE There is more than one method for safely removing a cataract. Your doctor can explain the differences and help determine which is best for you. Phacoemulsification surgery is the most common form of cataract surgery.  An injection is given behind the eye or eyedrops are given to make this a painless procedure.   A small cut (incision) is made on the edge of the clear, dome-shaped surface that covers the front of the eye (cornea).   A tiny probe is painlessly inserted into the eye. This device gives off ultrasound waves that soften and break up the cloudy center of the lens. This makes it easier for the cloudy lens to be removed by suction.   An IOL may be implanted.   The normal lens of the eye is covered by a clear capsule. Part of that capsule is intentionally left in the eye to support the IOL.   Your surgeon may or may not use stitches to close the incision.  There are other forms of cataract surgery that require a larger incision and stiches to close the eye. This approach is taken in cases where the doctor feels that the cataract cannot be easily removed using phacoemulsification. AFTER THE PROCEDURE  When an IOL is implanted, it does not need care. It becomes a permanent part of your  eye and cannot be seen or felt.   Your doctor will schedule follow-up exams to check on your progress.   Review your other medicines with your doctor to see which can be resumed after surgery.   Use eyedrops or take medicine as prescribed by your doctor.  Document Released: 11/23/2011 Document Reviewed: 11/20/2011 [facility] Patient Information 2012 [place].  .Cataract Surgery Care After Refer to this sheet in the next few weeks. These instructions provide you with information on caring for yourself after your procedure. Your caregiver may also give you more specific instructions. Your treatment has been planned according to current medical practices, but problems sometimes occur. Call your caregiver if you have any problems or questions after your procedure.  HOME CARE INSTRUCTIONS   Avoid strenuous activities as directed by your caregiver.   Ask your caregiver when you can resume driving.   Use eyedrops or other medicines to help healing and control pressure inside your eye as directed by your caregiver.   Only take over-the-counter or prescription medicines for pain, discomfort, or fever as directed by your caregiver.   Do not to touch or rub your eyes.   You may be instructed to use a protective shield during the first few days and nights after surgery. If not, wear sunglasses to protect your eyes. This is to protect the eye from pressure or from being accidentally bumped.   Keep the area around your eye clean and dry. Avoid swimming or allowing water to hit you directly in the face while showering. Keep soap and shampoo out of your eyes.   Do not bend or lift heavy objects. Bending increases pressure in the eye. You can walk, climb stairs, and do light household chores.   Do not put a contact lens into the eye that had surgery until your caregiver says it is okay to do so.   Ask your doctor when you can return to work. This will depend on the kind of work that you do. If  you work in a dusty environment, you may be advised to wear protective eyewear for a period of time.  Ask your caregiver when it will be safe to engage in sexual activity.   Continue with your regular eye exams as directed by your caregiver.  What to expect:  It is normal to feel itching and mild discomfort for a few days after cataract surgery. Some fluid discharge is also common, and your eye may be sensitive to light and touch.   After 1 to 2 days, even moderate discomfort should disappear. In most cases, healing will take about 6 weeks.   If you received an intraocular lens (IOL), you may notice that colors are very bright or have a blue tinge. Also, if you have been in bright sunlight, everything may appear reddish for a few hours. If you see these color tinges, it is because your lens is clear and no longer cloudy. Within a few months after receiving an IOL, these extra colors should go away. When you have healed, you will probably need new glasses.  SEEK MEDICAL CARE IF:   You have increased bruising around your eye.   You have discomfort not helped by medicine.  SEEK IMMEDIATE MEDICAL CARE IF:   You have a  fever.   You have a worsening or sudden vision loss.   You have redness, swelling, or increasing pain in the eye.   You have a thick discharge from the eye that had surgery.  MAKE SURE YOU:  Understand these instructions.   Will watch your condition.   Will get help right away if you are not doing well or get worse.  Document Released: 06/23/2005 Document Revised: 11/23/2011 Document Reviewed: 07/28/2011 [facility] Patient Information 2012 [place].    Monitored Anesthesia Care  Monitored anesthesia care is an anesthesia service for a medical procedure. Anesthesia is the loss of the ability to feel pain. It is produced by medications called anesthetics. It may affect a small area of your body (local anesthesia), a large area of your body (regional anesthesia),  or your entire body (general anesthesia). The need for monitored anesthesia care depends your procedure, your condition, and the potential need for regional or general anesthesia. It is often provided during procedures where:   General anesthesia may be needed if there are complications. This is because you need special care when you are under general anesthesia.    You will be under local or regional anesthesia. This is so that you are able to have higher levels of anesthesia if needed.    You will receive calming medications (sedatives). This is especially the case if sedatives are given to put you in a semi-conscious state of relaxation (deep sedation). This is because the amount of sedative needed to produce this state can be hard to predict. Too much of a sedative can produce general anesthesia. Monitored anesthesia care is performed by one or more caregivers who have special training in all types of anesthesia. You will need to meet with these caregivers before your procedure. During this meeting, they will ask you about your medical history. They will also give you instructions to follow. (For example, you will need to stop eating and drinking before your procedure. You may also need to stop or change medications you are taking.) During your procedure, your caregivers will stay with you. They will:   Watch your condition. This includes watching you blood pressure, breathing, and level of pain.    Diagnose and treat problems that occur.    Give medications if they are needed. These may include calming medications (sedatives) and anesthetics.  Make sure you are comfortable.   Having monitored anesthesia care does not necessarily mean that you will be under anesthesia. It does mean that your caregivers will be able to manage anesthesia if you need it or if it occurs. It also means that you will be able to have a different type of anesthesia than you are having if you need it. When your  procedure is complete, your caregivers will continue to watch your condition. They will make sure any medications wear off before you are allowed to go home.  Document Released: 08/30/2005 Document Revised: 03/31/2013 Document Reviewed: 01/15/2013 [facility] Patient Information 2014 [place], [place].

## 2020-01-23 ENCOUNTER — Other Ambulatory Visit (HOSPITAL_COMMUNITY)
Admission: RE | Admit: 2020-01-23 | Discharge: 2020-01-23 | Disposition: A | Discharge status: Home / Self Care | Payer: Medicare Other | Source: Ambulatory Visit | Attending: Ophthalmology | Admitting: Ophthalmology

## 2020-01-23 ENCOUNTER — Other Ambulatory Visit: Payer: Self-pay

## 2020-01-23 DIAGNOSIS — Z01812 Encounter for preprocedural laboratory examination: Secondary | ICD-10-CM | POA: Diagnosis present

## 2020-01-23 DIAGNOSIS — Z20822 Contact with and (suspected) exposure to covid-19: Secondary | ICD-10-CM | POA: Insufficient documentation

## 2020-01-23 LAB — SARS CORONAVIRUS 2 (TAT 6-24 HRS): SARS Coronavirus 2: NEGATIVE

## 2020-01-26 ENCOUNTER — Ambulatory Visit (HOSPITAL_COMMUNITY)
Admission: RE | Admit: 2020-01-26 | Discharge: 2020-01-26 | Disposition: A | Discharge status: Home / Self Care | Payer: Medicare Other | Attending: Ophthalmology | Admitting: Ophthalmology

## 2020-01-26 ENCOUNTER — Ambulatory Visit (HOSPITAL_COMMUNITY): Payer: Medicare Other | Admitting: Anesthesiology

## 2020-01-26 ENCOUNTER — Encounter (HOSPITAL_COMMUNITY): Payer: Self-pay | Admitting: Ophthalmology

## 2020-01-26 ENCOUNTER — Other Ambulatory Visit: Payer: Self-pay

## 2020-01-26 ENCOUNTER — Encounter (HOSPITAL_COMMUNITY)
Admission: RE | Disposition: A | Discharge status: Home / Self Care | Payer: Self-pay | Source: Home / Self Care | Attending: Ophthalmology

## 2020-01-26 DIAGNOSIS — K219 Gastro-esophageal reflux disease without esophagitis: Secondary | ICD-10-CM | POA: Diagnosis not present

## 2020-01-26 DIAGNOSIS — F17200 Nicotine dependence, unspecified, uncomplicated: Secondary | ICD-10-CM | POA: Insufficient documentation

## 2020-01-26 DIAGNOSIS — Z7901 Long term (current) use of anticoagulants: Secondary | ICD-10-CM | POA: Diagnosis not present

## 2020-01-26 DIAGNOSIS — E1136 Type 2 diabetes mellitus with diabetic cataract: Secondary | ICD-10-CM | POA: Insufficient documentation

## 2020-01-26 DIAGNOSIS — Z79899 Other long term (current) drug therapy: Secondary | ICD-10-CM | POA: Diagnosis not present

## 2020-01-26 DIAGNOSIS — M1990 Unspecified osteoarthritis, unspecified site: Secondary | ICD-10-CM | POA: Insufficient documentation

## 2020-01-26 DIAGNOSIS — F0280 Dementia in other diseases classified elsewhere without behavioral disturbance: Secondary | ICD-10-CM | POA: Insufficient documentation

## 2020-01-26 DIAGNOSIS — Z791 Long term (current) use of non-steroidal anti-inflammatories (NSAID): Secondary | ICD-10-CM | POA: Insufficient documentation

## 2020-01-26 DIAGNOSIS — Z7984 Long term (current) use of oral hypoglycemic drugs: Secondary | ICD-10-CM | POA: Diagnosis not present

## 2020-01-26 DIAGNOSIS — G3183 Dementia with Lewy bodies: Secondary | ICD-10-CM | POA: Diagnosis not present

## 2020-01-26 DIAGNOSIS — H25812 Combined forms of age-related cataract, left eye: Secondary | ICD-10-CM | POA: Diagnosis not present

## 2020-01-26 HISTORY — PX: CATARACT EXTRACTION W/PHACO: SHX586

## 2020-01-26 LAB — GLUCOSE, CAPILLARY: Glucose-Capillary: 295 mg/dL — ABNORMAL HIGH (ref 70–99)

## 2020-01-26 SURGERY — PHACOEMULSIFICATION, CATARACT, WITH IOL INSERTION
Anesthesia: Monitor Anesthesia Care | Site: Eye | Laterality: Left

## 2020-01-26 MED ORDER — EPINEPHRINE PF 1 MG/ML IJ SOLN
INTRAMUSCULAR | Status: AC
  Filled 2020-01-26: qty 2

## 2020-01-26 MED ORDER — SODIUM HYALURONATE 23 MG/ML IO SOLN
INTRAOCULAR | Status: DC | PRN
  Administered 2020-01-26: 0.6 mL via INTRAOCULAR

## 2020-01-26 MED ORDER — CYCLOPENTOLATE-PHENYLEPHRINE 0.2-1 % OP SOLN
1.0000 [drp] | OPHTHALMIC | Status: AC | PRN
  Administered 2020-01-26 (×3): 1 [drp] via OPHTHALMIC

## 2020-01-26 MED ORDER — PHENYLEPHRINE HCL 2.5 % OP SOLN
1.0000 [drp] | OPHTHALMIC | Status: AC | PRN
  Administered 2020-01-26 (×3): 1 [drp] via OPHTHALMIC

## 2020-01-26 MED ORDER — POVIDONE-IODINE 5 % OP SOLN
OPHTHALMIC | Status: DC | PRN
  Administered 2020-01-26: 1 via OPHTHALMIC

## 2020-01-26 MED ORDER — TETRACAINE HCL 0.5 % OP SOLN
1.0000 [drp] | OPHTHALMIC | Status: AC | PRN
  Administered 2020-01-26 (×3): 1 [drp] via OPHTHALMIC

## 2020-01-26 MED ORDER — EPINEPHRINE PF 1 MG/ML IJ SOLN
INTRAOCULAR | Status: DC | PRN
  Administered 2020-01-26: 11:00:00 500 mL

## 2020-01-26 MED ORDER — LIDOCAINE HCL 3.5 % OP GEL
1.0000 "application " | Freq: Once | OPHTHALMIC | Status: AC
  Administered 2020-01-26: 1 via OPHTHALMIC

## 2020-01-26 MED ORDER — LIDOCAINE HCL (PF) 1 % IJ SOLN
INTRAOCULAR | Status: DC | PRN
  Administered 2020-01-26: 11:00:00 1 mL via OPHTHALMIC

## 2020-01-26 MED ORDER — MIDAZOLAM HCL 2 MG/2ML IJ SOLN
INTRAMUSCULAR | Status: AC
  Filled 2020-01-26: qty 2

## 2020-01-26 MED ORDER — PROVISC 10 MG/ML IO SOLN
INTRAOCULAR | Status: DC | PRN
  Administered 2020-01-26: 0.85 mL via INTRAOCULAR

## 2020-01-26 MED ORDER — BSS IO SOLN
INTRAOCULAR | Status: DC | PRN
  Administered 2020-01-26: 15 mL via INTRAOCULAR

## 2020-01-26 MED ORDER — NEOMYCIN-POLYMYXIN-DEXAMETH 3.5-10000-0.1 OP SUSP
OPHTHALMIC | Status: DC | PRN
  Administered 2020-01-26: 1 [drp] via OPHTHALMIC

## 2020-01-26 MED ORDER — MIDAZOLAM HCL 5 MG/5ML IJ SOLN
INTRAMUSCULAR | Status: DC | PRN
  Administered 2020-01-26: 1 mg via INTRAVENOUS

## 2020-01-26 SURGICAL SUPPLY — 13 items

## 2020-01-26 NOTE — Transfer of Care (Signed)
Immediate Anesthesia Transfer of Care Note  Patient: Kathleen Barnett  Procedure(s) Performed: CATARACT EXTRACTION PHACO AND INTRAOCULAR LENS PLACEMENT (IOC) (Left Eye)  Patient Location: PACU  Anesthesia Type:MAC  Level of Consciousness: awake  Airway & Oxygen Therapy: Patient Spontanous Breathing  Post-op Assessment: Report given to RN  Post vital signs: Reviewed  Last Vitals:  Vitals Value Taken Time  BP    Temp    Pulse    Resp    SpO2      Last Pain:  Vitals:   01/26/20 1032  TempSrc: Oral  PainSc: 0-No pain         Complications: No apparent anesthesia complications

## 2020-01-26 NOTE — Anesthesia Postprocedure Evaluation (Signed)
Anesthesia Post Note  Patient: Kathleen Barnett  Procedure(s) Performed: CATARACT EXTRACTION PHACO AND INTRAOCULAR LENS PLACEMENT (IOC) (Left Eye)  Patient location during evaluation: Short Stay Anesthesia Type: MAC Level of consciousness: awake and alert and oriented Pain management: pain level controlled Vital Signs Assessment: post-procedure vital signs reviewed and stable Respiratory status: spontaneous breathing Cardiovascular status: stable and blood pressure returned to baseline Postop Assessment: no apparent nausea or vomiting Anesthetic complications: no     Last Vitals:  Vitals:   01/26/20 1032  BP: (!) 160/93  Pulse: (!) 101  Resp: (!) 23  Temp: 36.6 C  SpO2: 99%    Last Pain:  Vitals:   01/26/20 1032  TempSrc: Oral  PainSc: 0-No pain                 Donovan Gatchel

## 2020-01-26 NOTE — Interval H&P Note (Signed)
History and Physical Interval Note: The H and P was reviewed and updated. The patient was examined.  No changes were found after exam.  The surgical eye was marked.  01/26/2020 10:57 AM  Kathleen Barnett  has presented today for surgery, with the diagnosis of Nuclear sclerotic cataract - Left eye.  The various methods of treatment have been discussed with the patient and family. After consideration of risks, benefits and other options for treatment, the patient has consented to  Procedure(s) with comments: CATARACT EXTRACTION PHACO AND INTRAOCULAR LENS PLACEMENT (IOC) (Left) - left as a surgical intervention.  The patient's history has been reviewed, patient examined, no change in status, stable for surgery.  I have reviewed the patient's chart and labs.  Questions were answered to the patient's satisfaction.     Fabio Pierce

## 2020-01-26 NOTE — Anesthesia Preprocedure Evaluation (Signed)
Anesthesia Evaluation  Patient identified by MRN, date of birth, ID band Patient awake    Reviewed: Allergy & Precautions, NPO status , Patient's Chart, lab work & pertinent test results  Airway Mallampati: II  TM Distance: >3 FB Neck ROM: Full    Dental no notable dental hx. (+) Upper Dentures, Edentulous Lower   Pulmonary neg pulmonary ROS, Current Smoker and Patient abstained from smoking.,    Pulmonary exam normal breath sounds clear to auscultation       Cardiovascular Exercise Tolerance: Good + angina Normal cardiovascular examI Rhythm:Regular Rate:Normal  On Isosorbide and kept anticoag with coumadin -unaware why  Denies recent CP/DOE As per ED visit note, she had stent placement/valve procedure in [place]   Neuro/Psych Dementia On meds  Knew person ,place,date  Thought Trump is president negative neurological ROS  negative psych ROS   GI/Hepatic Neg liver ROS, GERD  ,  Endo/Other  diabetes (FSBS - 295), Well Controlled, Type 2, Oral Hypoglycemic Agents  Renal/GU negative Renal ROS  negative genitourinary   Musculoskeletal negative musculoskeletal ROS (+)   Abdominal   Peds negative pediatric ROS (+)  Hematology   Anesthesia Other Findings   Reproductive/Obstetrics negative OB ROS                            Anesthesia Physical  Anesthesia Plan  ASA: III  Anesthesia Plan: MAC   Post-op Pain Management:    Induction: Intravenous  PONV Risk Score and Plan: 1 and TIVA  Airway Management Planned: Nasal Cannula and Simple Face Mask  Additional Equipment:   Intra-op Plan:   Post-operative Plan:   Informed Consent: I have reviewed the patients History and Physical, chart, labs and discussed the procedure including the risks, benefits and alternatives for the proposed anesthesia with the patient or authorized representative who has indicated his/her understanding and  acceptance.     Dental advisory given  Plan Discussed with: CRNA  Anesthesia Plan Comments:         Anesthesia Quick Evaluation

## 2020-01-26 NOTE — Op Note (Signed)
Date of procedure: 01/26/20  Pre-operative diagnosis: Visually significant age-related combined cataract, Left Eye (H25.812)  Post-operative diagnosis: Visually significant age-related combined cataract, Left Eye (H25.812)  Procedure: Removal of cataract via phacoemulsification and insertion of intra-ocular lens Wynetta Emery and [place]  +17.0D into the capsular bag of the Left Eye  Attending surgeon: Gerda Diss. Mykal Batiz, MD, MA  Anesthesia: MAC, Topical [place]  Complications: None  Estimated Blood Loss: <20m (minimal)  Specimens: None  Implants: As above  Indications:  Visually significant age-related cataract, Left Eye  Procedure:  The patient was seen and identified in the pre-operative area. The operative eye was identified and dilated.  The operative eye was marked.  Topical anesthesia was administered to the operative eye.     The patient was then to the operative suite and placed in the supine position.  A timeout was performed confirming the patient, procedure to be performed, and all other relevant information.   The patient's face was prepped and draped in the usual fashion for intra-ocular surgery.  A lid speculum was placed into the operative eye and the surgical microscope moved into place and focused.  An inferotemporal paracentesis was created using a 20 gauge paracentesis blade.  Shugarcaine was injected into the anterior chamber.  Viscoelastic was injected into the anterior chamber.  A temporal clear-corneal main wound incision was created using a 2.416mmicrokeratome.  A continuous curvilinear capsulorrhexis was initiated using an irrigating cystitome and completed using capsulorrhexis forceps.  Hydrodissection and hydrodeliniation were performed.  Viscoelastic was injected into the anterior chamber.  A phacoemulsification handpiece and a chopper as a second instrument were used to remove the nucleus and epinucleus. The irrigation/aspiration handpiece was used to remove  any remaining cortical material.   The capsular bag was reinflated with viscoelastic, checked, and found to be intact.  The intraocular lens was inserted into the capsular bag.  The irrigation/aspiration handpiece was used to remove any remaining viscoelastic.  The clear corneal wound and paracentesis wounds were then hydrated and checked with Weck-Cels to be watertight.  The lid-speculum and drape was removed, and the patient's face was cleaned with a wet and dry 4x4.  Maxitrol was instilled in the eye before a clear shield was taped over the eye. The patient was taken to the post-operative care unit in good condition, having tolerated the procedure well.  Post-Op Instructions: [facility] and will receive all other orders and instructions.

## 2020-01-26 NOTE — Discharge Instructions (Signed)
Please discharge patient when stable, will follow up today with Dr. Malita Ignasiak at the [facility] office immediately following discharge.  Leave shield in place until visit.  All paperwork with discharge instructions will be given at the office.  [facility] Address:  [address]  [place], [place] [address]  

## 2020-04-15 ENCOUNTER — Encounter: Payer: Medicare Other | Admitting: Psychology

## 2020-05-04 ENCOUNTER — Encounter: Payer: Self-pay | Admitting: Psychology

## 2020-05-25 ENCOUNTER — Other Ambulatory Visit: Payer: Self-pay

## 2020-05-25 ENCOUNTER — Encounter: Payer: Medicare Other | Attending: Psychology | Admitting: Psychology

## 2020-05-25 DIAGNOSIS — R413 Other amnesia: Secondary | ICD-10-CM | POA: Insufficient documentation

## 2020-05-25 DIAGNOSIS — F341 Dysthymic disorder: Secondary | ICD-10-CM | POA: Diagnosis not present

## 2020-05-30 ENCOUNTER — Encounter: Payer: Self-pay | Admitting: Psychology

## 2020-05-30 NOTE — Progress Notes (Addendum)
NEUROBEHAVIORAL STATUS EXAM   Name: Kathleen Barnett Date of Birth: [birth year] Date of Interview: 05/30/2020  Reason for Referral:  Kathleen Barnett is a 79 y.o. female who is referred for neuropsychological evaluation by Marylynn Pearson, FNP due to concerns about her memory and overall cognitive functioning; she has a reported family history of Lewy Body Dementia. This patient is accompanied in the office by her daughter and full time caretaker/roomate (since 02/2019) who supplements the history.  History of Presenting Problem:  The patient's daughter reports that the patient appeared to start having memory difficulties about 4 years ago including memory loss and confusions about various facts and general information.  The patient is described as having trouble figuring day-to-day/normal situations like she used to.    Her daughter first became concerned about the patient's cognitive functioning after learning that she lost a significant amount of money on a cruise and had no recollection of doing so. Per her report, the patient's daughter took over her mother's finances after this incident, despite some resistance from her. Her daughter described patient as constantly worried about finances and stated that she would regularly ask for money. However, daughter reported that patient has stopped caring about finances in the past year and does not remember to check her monthly bank statements, which is a significant change from before.  In addition, her daughter reported that patient forgets appointments, is very disorganized, has difficulty with navigation and remembering routes, shops for the wrong items at the grocery store (despite having a shopping list), and is unable to follow through and complete tasks. Patient's daughter and caretaker have observed decreased initiation and tendency to withdraw from social interaction.    The patient is taking care of her normal day-to-day activities including  bathing and dressing. There are no reports of falls. The patient is no longer driving (stopped 4-5 years ago). She sleeps 7-8 hours a night and denies any REM sleep disorder or neurovegetative signs. The patient has some pain but she did not endorse other physical symptoms.   Her most recent MRI (w/o contrast; 09/04/19) revealed the following: "Motion degraded examination, limiting evaluation. No evidence of acute intracranial abnormality. Moderate generalized cerebral atrophy, as well as suggestion of mild midbrain atrophy. Moderate chronic small vessel ischemic disease. 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 Correlate for hypertension."  Upon direct questioning, the daughter and caretaker reported:   Forgetting recent conversations/events: Yes Repeating statements/questions: Yes Misplacing/losing items: Yes Forgetting appointments or other obligations: Yes Forgetting to take medications: Yes  Difficulty concentrating: Yes Starting but not finishing tasks: Yes Distracted easily: Yes Processing information more slowly: Yes  Word-finding difficulty: Yes Word substitutions: No Writing difficulty: Some trouble Spelling difficulty: Some trouble  Comprehension difficulty: Mild difficulty   Current Functioning: Work: Retired.   Complex ADLs Driving: Has active license but has not driven in 5 years. Does not have a car of her own.  Medication management: Requires assistance  Management of finances: Not able to perform independently    Appointments: Not able to manage independently  Cooking: Able to cook simple/basic meals using microwave but does not use stove or oven.   Medical/Physical complaints:  Any hx of stroke/TIA, MI, LOC/TBI, Sz? Denied Hx falls? Denied Balance, probs walking? Denied  Sleep: Insomnia? OSA? CPAP? REM sleep beh sx? Denied  Visual illusions/hallucinations? Denied  Appetite/Nutrition/Weight  changes: Denied  Behavioral disturbance/Personality change: Denied   Suicidal Ideation/Intention: Denied ideation, plan, and intent.  No history of suicide attempt.   Family History No information is available for patient's mother. Her father reportedly lived to age 65 without dementia. Patient's daughter was allegedly told by her aunt-in-law that her two uncles (patient's brothers) were diagnosed with Lewy Body Dementia; no other information available.   Present Living Situation The patient resides in [place], [place] with a fulltime caretaker. Her caretaker is a [employer] member who purportedly has experience caring for the elderly but denied formal training or certifications.   Psychiatric History: The patient's daughter mentioned that her mother has struggled with depression her whole life and that she has always been a negative person with low mood. However, she had appeared less depressed with current medication regimen. She denied any history of psychiatric diagnosis or treatment.   History of substance dependence/treatment: She denied alcohol or recreational drug abuse or dependence.  She began smoking cigarettes around 8 months ago after quitting for over 20 years (e.g., started at 5; less than 1 pack per day)   Psychosocial History The patient was born and raised in [place], [place] and speaks only [place].  She reported normal developmental milestones.  She described herself as a good reader and denied history of learning disability or attention deficits disorder.  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  She was married for 52 years and has one grown daughter, 2 grandchildren, and 1 great-grandchild. She also had a son who died of an aneurysm 5 years ago.   Medical History: Past Medical History:  Diagnosis Date  . Diabetes mellitus without complication (HCC)     Current Medications:  Outpatient Encounter Medications as of 05/25/2020  Medication Sig   . albuterol (VENTOLIN HFA) 108 (90 Base) MCG/ACT inhaler Inhale 2 puffs into the lungs every 6 (six) hours as needed for wheezing or shortness of breath.   Marland Kitchen atorvastatin (LIPITOR) 20 MG tablet Take 20 mg by mouth every evening.   . donepezil (ARICEPT) 10 MG tablet Take 10 mg by mouth daily.  Marland Kitchen ibuprofen (ADVIL) 200 MG tablet Take 400 mg by mouth daily as needed for moderate pain.  . isosorbide mononitrate (IMDUR) 30 MG 24 hr tablet Take 30 mg by mouth daily.   . memantine (NAMENDA) 10 MG tablet Take 10 mg by mouth 2 (two) times daily.  . metFORMIN (GLUCOPHAGE) 500 MG tablet Take 500 mg by mouth 2 (two) times daily with a meal.  . moxifloxacin (VIGAMOX) 0.5 % ophthalmic solution Place 1 drop into the left eye 3 (three) times daily.   . naproxen (NAPROSYN) 500 MG tablet Take 500 mg by mouth 2 (two) times daily.  . nepafenac (ILEVRO) 0.3 % ophthalmic suspension Place 1 drop into the right eye daily.  Marland Kitchen omeprazole (PRILOSEC) 40 MG capsule Take 40 mg by mouth daily.  . prednisoLONE acetate (PRED FORTE) 1 % ophthalmic suspension Place 1 drop into the right eye 2 (two) times daily.  . traMADol (ULTRAM) 50 MG tablet Take 50 mg by mouth daily as needed (pain).   . traZODone (DESYREL) 150 MG tablet Take 300 mg by mouth at bedtime.   Marland Kitchen warfarin (COUMADIN) 5 MG tablet Take 7.5-10 mg by mouth See admin instructions. Take 10 mg at night on Mon, Wed, and Fri, take 7.5 mg at night on Sun, Tues, Thurs, and Sat   No facility-administered encounter medications on file as of 05/25/2020.   BEHAVIORAL OBSERVATIONS The patient arrived at the scheduled appointment on time and was accompanied by her daughter  and caretaker. She was appropriately attired and adequately groomed.  She did not display any gross motor or movement abnormalities; Her gait was steady, and no tremors were observed.  She tended to minimize her difficulties and symptoms.  The patient was alert and partially oriented.  Her mood was mildly depressed,  but she was pleasant and affable.  She made good eye contact and smiled throughout the interview.  Speech characteristics were unremarkable.  She openly volunteered information, but she had difficulty remembering specific details, dates, and her daughter's birthday. Thought processes were generally linear, organized and relevant.  Insight and judgment appeared limited as she tended to underreport the extent of her cognitive changes.    90 minutes spent face-to-face with patient completing neurobehavioral status exam. 30 minutes spent integrating medical records/clinical data and completing this report. [id number] unit; [id number].  TESTING: There is medical necessity to proceed with neuropsychological assessment as the results will be used to aid in differential diagnosis and clinical decision-making and to inform specific treatment recommendations. Per the patient and her daughter's report, and medical records reviewed, there has been a change in cognitive functioning and a reasonable suspicion of dementia.   Clinical Decision Making: In considering the patient's current level of functioning, level of presumed impairment, nature of symptoms, emotional and behavioral responses during the interview, level of literacy, and observed level of motivation, a battery of tests was selected and will be administered to the patient by this provider.   DISPOSITION/PLAN                  We have set the patient up for formal neuropsychological testing.  We will utilize a standard well normed battery of neuropsychological tests (RBANS-Form A) to broadly assess different cognitive domains and utilize and other measures of language (I.e., expressive and receptive, processing speed, and aspects of executive functioning including set shifting, flexibility, and everyday judgment.   The patient will return to see me for a follow-up session at which time her test performances and my impressions and treatment recommendations will  be reviewed in detail.   Evaluation ongoing; full report to follow.     __________________________________     Joelyn Oms, Psy.D.       Clinical Neuropsychologist

## 2020-06-25 ENCOUNTER — Encounter: Payer: Medicare Other | Admitting: Psychology

## 2020-06-29 ENCOUNTER — Ambulatory Visit: Payer: Medicare Other | Admitting: Psychology

## 2020-06-30 ENCOUNTER — Ambulatory Visit: Payer: Medicare Other | Admitting: Psychology

## 2020-07-12 ENCOUNTER — Other Ambulatory Visit: Payer: Self-pay

## 2020-07-12 ENCOUNTER — Encounter: Payer: Self-pay | Admitting: Psychology

## 2020-07-12 ENCOUNTER — Encounter: Payer: Medicare Other | Attending: Psychology | Admitting: Psychology

## 2020-07-12 DIAGNOSIS — I4891 Unspecified atrial fibrillation: Secondary | ICD-10-CM | POA: Diagnosis not present

## 2020-07-12 DIAGNOSIS — F341 Dysthymic disorder: Secondary | ICD-10-CM | POA: Diagnosis not present

## 2020-07-12 DIAGNOSIS — I10 Essential (primary) hypertension: Secondary | ICD-10-CM | POA: Diagnosis not present

## 2020-07-12 DIAGNOSIS — R251 Tremor, unspecified: Secondary | ICD-10-CM | POA: Diagnosis not present

## 2020-07-12 DIAGNOSIS — E119 Type 2 diabetes mellitus without complications: Secondary | ICD-10-CM | POA: Diagnosis not present

## 2020-07-12 DIAGNOSIS — Z7901 Long term (current) use of anticoagulants: Secondary | ICD-10-CM | POA: Diagnosis not present

## 2020-07-12 DIAGNOSIS — G4700 Insomnia, unspecified: Secondary | ICD-10-CM | POA: Diagnosis not present

## 2020-07-12 DIAGNOSIS — R413 Other amnesia: Secondary | ICD-10-CM | POA: Diagnosis not present

## 2020-07-12 DIAGNOSIS — G3183 Dementia with Lewy bodies: Secondary | ICD-10-CM | POA: Diagnosis present

## 2020-07-12 DIAGNOSIS — Z7984 Long term (current) use of oral hypoglycemic drugs: Secondary | ICD-10-CM | POA: Diagnosis not present

## 2020-07-12 DIAGNOSIS — Z79899 Other long term (current) drug therapy: Secondary | ICD-10-CM | POA: Insufficient documentation

## 2020-07-12 NOTE — Progress Notes (Signed)
° °  Neuropsychology Note  Kathleen Barnett completed 120 minutes of neuropsychological testing with this provider. The patient did appear overtly distressed by the testing session and expressed desire to quit multiple times. She was highly critical of herself.   Clinical Decision Making: In considering the patient's current level of functioning, level of presumed impairment, nature of symptoms, emotional and behavioral responses during the interview, level of literacy, and observed level of motivation/effort, a battery of tests was selected and administered by this provider   Kathleen Barnett will return on 07/20/20 for an interactive feedback session with this provider at which time her test performances, clinical impressions and treatment recommendations will be reviewed in detail.     120 minutes spent face-to-face with patient administering and scoring standardized tests [CPT [id number]]  Full report to follow.

## 2020-07-15 ENCOUNTER — Encounter (HOSPITAL_BASED_OUTPATIENT_CLINIC_OR_DEPARTMENT_OTHER): Payer: Medicare Other | Admitting: Psychology

## 2020-07-15 ENCOUNTER — Other Ambulatory Visit: Payer: Self-pay

## 2020-07-15 DIAGNOSIS — F0391 Unspecified dementia with behavioral disturbance: Secondary | ICD-10-CM

## 2020-07-15 DIAGNOSIS — R413 Other amnesia: Secondary | ICD-10-CM

## 2020-07-15 DIAGNOSIS — F0151 Vascular dementia with behavioral disturbance: Secondary | ICD-10-CM

## 2020-07-15 DIAGNOSIS — G3183 Dementia with Lewy bodies: Secondary | ICD-10-CM | POA: Diagnosis not present

## 2020-07-15 DIAGNOSIS — F341 Dysthymic disorder: Secondary | ICD-10-CM | POA: Diagnosis not present

## 2020-07-16 ENCOUNTER — Encounter: Payer: Self-pay | Admitting: Psychology

## 2020-07-16 NOTE — Progress Notes (Addendum)
NEUROPSYCHOLOGICAL EVALUATION    Name:    Kathleen Barnett  Date of Birth:   Jan 30, 1941 Date of Interview:  05/25/20 Date of Testing:  07/12/20   Date of Feedback:  07/20/20     Background Information:  Reason for Referral:  Kathleen Barnett is a 79 y.o. female who is referred for neuropsychological evaluation by Denyce Robert, FNP due to concerns about her memory and overall cognitive functioning; she has a reported family history of Lewy Body Dementia. This patient is accompanied in the office by her daughter and full time caretaker/roomate (since 02/2019) who supplements the history.  History of Presenting Problem:  The patient's daughter reports that the patient appeared to start having memory difficulties about 4 years ago including memory loss and confusions about various facts and general information. The patient is described as having trouble figuring day-to-day/normal situations like she used to. Her daughter first became concerned about the patient's cognitive functioning after learning that she lost a significant amount of money on a cruise and had no recollection of doing so. Per her report, the patient's daughter took over her mother's finances after this incident, despite some resistance from her. Her daughter described patient as constantly worried about finances and stated that she would regularly ask for money. However, daughter reported that patient has stopped caring about finances in the past year and does not remember to check her monthly bank statements, which is a significant change from before.  In addition, her daughter reported that patient forgets appointments, is very disorganized, has difficulty with navigation and remembering routes, shops for the wrong items at the grocery store (despite having a shopping list), and is unable to follow through and complete tasks. Patient's daughter and caretaker have observed decreased initiation and tendency to withdraw from social  interaction.   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   The patient is taking care of her normal day-to-day activities including bathing and dressing. There are no reports of falls. The patient is no longer driving (stopped 4-5 years ago). She sleeps 7-8 hours a night and denies any REM sleep disorder or neurovegetative signs. The patient has some pain but she did not endorse other physical symptoms. She is currently being treated via Seroquel, Aricept, and Namenda that have reportedly helped to improve sleep, mood, and memory concerns (to lesser degree).   Her most recent MRI (w/o contrast; 09/04/2019) revealed the following:  No evidence of acute intracranial abnormality. Moderate generalized cerebral atrophy, as well as suggestion of mild midbrain atrophy. Moderate chronic small vessel ischemic disease. 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 Correlate for hypertension.  She underwent cataract surgery (e.g., extraction with intraocular lens implant phacoemulsification) on left eye on 01/26/2020 that reportedly helped improve vision.    Medical History:  Past Medical History:  Diagnosis Date  . Diabetes mellitus without complication ([place])    Current medications:  Outpatient Encounter Medications as of 07/15/2020  Medication Sig  . albuterol (VENTOLIN HFA) 108 (90 Base) MCG/ACT inhaler Inhale 2 puffs into the lungs every 6 (six) hours as needed for wheezing or shortness of breath.   Marland Kitchen atorvastatin (LIPITOR) 20 MG tablet Take 20 mg by mouth every evening.   . donepezil (ARICEPT) 10 MG tablet Take 10 mg by mouth daily.  Marland Kitchen ibuprofen (ADVIL) 200 MG tablet Take 400 mg by mouth daily as needed for moderate pain.  . isosorbide mononitrate (IMDUR) 30 MG 24  hr tablet Take 30 mg by mouth daily.   . memantine (NAMENDA) 10 MG tablet Take 10 mg by mouth 2 (two) times daily.  . metFORMIN (GLUCOPHAGE) 500  MG tablet Take 500 mg by mouth 2 (two) times daily with a meal.  . moxifloxacin (VIGAMOX) 0.5 % ophthalmic solution Place 1 drop into the left eye 3 (three) times daily.   . naproxen (NAPROSYN) 500 MG tablet Take 500 mg by mouth 2 (two) times daily.  . nepafenac (ILEVRO) 0.3 % ophthalmic suspension Place 1 drop into the right eye daily.  Marland Kitchen omeprazole (PRILOSEC) 40 MG capsule Take 40 mg by mouth daily.  . prednisoLONE acetate (PRED FORTE) 1 % ophthalmic suspension Place 1 drop into the right eye 2 (two) times daily.  . traMADol (ULTRAM) 50 MG tablet Take 50 mg by mouth daily as needed (pain).   . traZODone (DESYREL) 150 MG tablet Take 300 mg by mouth at bedtime.   Marland Kitchen warfarin (COUMADIN) 5 MG tablet Take 7.5-10 mg by mouth See admin instructions. Take 10 mg at night on Mon, Wed, and Fri, take 7.5 mg at night on Sun, Tues, Thurs, and Sat   No facility-administered encounter medications on file as of 07/15/2020.   Current Examination:  Behavioral Observations:  The patient arrived at the scheduled appointment on time and was accompanied by her daughter and caretaker. She was appropriately attired and adequately groomed.  She did not display any gross motor or movement abnormalities; Her gait was steady, and no tremors were observed.  She tended to minimize her difficulties and symptoms.  The patient was alert and partially oriented.  Her mood was depressed, but she was pleasant and affable.  She made good eye contact and smiled throughout the interview.  Speech characteristics were unremarkable.  She openly volunteered information, but she had difficulty remembering specific details, dates, and her daughter's birthday. Thought processes were generally linear, organized and relevant.  Insight and judgment appeared limited as she tended to underreport the extent of her cognitive changes.  Tests Administered: . Animal Naming Test  . Jarome Lamas Executive Function System, Selected subtests . Grooved  Pegboard . Repeatable Battery for the Assessment of Neuropsychological Status, Update (Form A)  . Test of Practical Judgement (TOP-J)  . WRAT-5 Word Reading   Test Results:   Note: Standardized scores are presented only for use by appropriately trained professionals and to allow for any future test-retest comparison. These scores should not be interpreted without consideration of all the information that is contained in the rest of the report. The most recent standardization samples from the test publisher or other sources were used whenever possible to derive standard scores; scores were corrected for age, gender, ethnicity and education when available.   Premorbid Intellectual Ability:  Given her reported educational and occupational history, Kathleen Barnett's premorbid intellectual ability was estimated to be average. Performance on a test of oral word reading (WRAT Reading=97, ), which can be an indicator of premorbid ability, was average for her age (53nd percentile).  Overall Cognitive Status:   Kathleen Barnett was given a neuropsychological screening battery (RBANS), which contains 12 subtests covering five neuropsychological domains. Her total scale score on that battery, a composite of performance across tests and estimate of global cognitive functioning, was 63, which was severely impaired for her age (1st percentile). Performance on individual tests and domain scores are provided below.   With regard to orientation, she was partially oriented to various aspects of time, and fully oriented  to situation, place, and self.   Attention/Processing Speed:   Kathleen Barnett RBANS Attention Index score of 72 was borderline impaired for her age (16rd percentile). Her performance on a test of auditory attention (RBANS Digit Span) was borderline impaired (2nd percentile) and she repeated up to 4 digits in order. A test of processing speed with visual scanning (RBANS Coding) was low average (16th percentile). Another  test of processing speed with visual scanning (DKEFS Visual Scanning) was average for her age and gender (37th percentile).   Language:  The tone, prosody, and fluency of Kathleen Barnett's speech were normal, with no clear errors or difficulty in speech and receptive language was intact. Her RBANS Language Index score of 88 was low average for her age (89st percentile). Confrontation naming (RBANS Picture Naming) was at least average (26th-50th percentile). Category fluency (RBANS Semantic Fluency) was borderline impaired (5th percentile). Another test of category fluency (DKEFS Category Fluency) was average for her age, gender, ethnicity, and education (25th percentile). Letter fluency on the DKEFS placed in the borderline impaired (5th percentile) range and in the impaired range for her age, gender, ethnicity, and education (1st percentile)   Visuospatial Abilities: Visuospatial ability was a relative strength. Kathleen Barnett RBANS Visuospatial Index score of 92 was average for her age (54th percentile) and generally consistent with estimated premorbid functioning. Copy of a geometric figure (RBANS Figure Copy) was average (37th percentile) and visuoperception (RBANS Line Orientation) was also average (26th to 50th percentile).   Memory:  Kathleen Barnett RBANS Immediate Memory Index score of 53 was severely impaired for her age (<1st percentile). Immediate recall for a list of 10 words read to her 4 times (RBANS List Learning) was impaired (<1st percentile). Immediate recall for a story read 2 times (RBANS Story Memory) was mildly impaired (1st percentile).   Kathleen Barnett RBANS Delayed Memory Index score of 48 was severely impaired for her age (<1st percentile). Spontaneous recall for the list of words read to her after delay (RBANS List Recall) was severely impaired (0 words, ?2nd percentile) and yes/no recognition for the words (RBANS List Recognition) was also impaired (14/20, ?2nd percentile). Delayed recall for the  story (RBANS Story Recall) was impaired (1st percentile). Spontaneous recall for the copied figure (RBANS Figure Recall) was severely impaired (<1st percentile).   Executive Functioning:  As mentioned above, Kathleen Barnett performance was relatively weak for aspects of working memory and lexical fluency. Behaviorally, she showed minimal social inappropriateness but did show impulsivity and some set-loss.   Also mentioned above, tests of motor speed and visual scanning were average and relatively preserved. When a switching component was added (Number-Letter Switching), Kathleen Barnett performance was low average (16th percentile) with 2 set-loss errors noted (14th percentile)   On a test of "everyday practical judgement" in which Kathleen Barnett was asked about health and safety situations and concepts (TOPJ), her score was relatively impaired and mostly consistent with individuals with aMCI and possibly mild Alzheimer's disease, showing reduced judgment and responses to situations along with vague or limited explanation of reasoning.   Emotional Functioning: She appeared depressed throughout the evaluation and was observed making several self-deprecating statements about her abilities; she is highly self critical.   Summary/Impressions:  Kathleen Barnett performance on cognitive tests today appeared generally valid. On a battery of neuropsychological tests, she showed impaired overall cognitive functioning  overall. Performance was relatively intact for more basic language, visuospatial abilities, and psychomotor speed, However, she showed impairment in basic attention, immediate and delayed  memory, fine motor coordination, and some executive functions including judgment. Behaviorally she showed signs of executive dysfunction including impulsivity. There is evidence that her cognitive deficits are interfering with her ability to manage complex tasks, such as managing finances and cooking. As such, diagnostic criteria for a  dementia syndrome are met.   Results do suggest presence of significant neurocognitive decline compared to estimates of baseline functioning and appear inconsistent with normal aging. Given her medical history including multiple vascular risk factors, and relatively recent (e.g., 08/2019) neuroimaging demonstrating moderate generalized cerebral atrophy, as well as suggestion of mild midbrain atrophy, moderate chronic small vessel ischemic disease, and multiple chronic bilateral basal ganglia lacunar infarcts, a vascular etiology is extremely likely. 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   She is currently living with a roommate who provides some basic help and support around the house. She reportedly feels very safe living with this adult female and enjoys his company. Her daughter described this as temporary living arrangement and stated that she plans on having patient live with her in near future; she recently got a promotion at work and may be moving into larger home. She currently lives down the street and looks after patient's basic living needs.      Recommendations: Based on the findings of the present evaluation, the following recommendations are offered:   1.  Optimal control of vascular risk factors is necessary to reduce the risk of further cognitive decline.  2.  Continue taking cholinesterase inhibitor therapy as prescribed.  3.  The patient should continue to receive assistance with complex ADLs, including finances, cooking and      transportation.  4.  Given her poor performance on a cognitive test highly correlated with driving ability, it is my    recommendation that she not return to driving.  5.  Her daughter should continue monitoring her medications to make sure they are being taken correctly.   6.  The patient should continue to participate in activities which provide mental stimulation (e.g., engaging in   puzzles, art projects and listening to music, etc), safe cardiovascular exercise, and social interaction.  7.  A regular routine/schedule will likely be beneficial.   8.  PoA, living will and estate planning should be completed, if these are not yet in place.  9.  Caregiver support and education are available through the [employer] ([employer]) and   Tax adviser (Caregiver Support Program Coordinator phone: [phone], email:   [email or web address]).  10.  Neuropsychological re-assessment in one year is recommended in order to monitor cognitive status,   track progression of symptoms and further assist with treatment planning.  Feedback to Patient: Kathleen Barnett will return for a feedback appointment on 07/20/20 to review the results of her neuropsychological evaluation with this provider. Education regarding Vascular Dementia will be provided at this time to patient and daughter.    Thank you for your referral of Kathleen Barnett. Please feel free to contact me if you have any questions or concerns regarding this report.

## 2020-07-20 ENCOUNTER — Encounter: Payer: Self-pay | Admitting: Psychology

## 2020-07-20 ENCOUNTER — Encounter: Payer: Medicare Other | Attending: Psychology | Admitting: Psychology

## 2020-07-20 DIAGNOSIS — R413 Other amnesia: Secondary | ICD-10-CM | POA: Insufficient documentation

## 2020-07-20 DIAGNOSIS — F341 Dysthymic disorder: Secondary | ICD-10-CM | POA: Insufficient documentation

## 2020-08-10 ENCOUNTER — Ambulatory Visit: Payer: Medicare Other | Admitting: Psychology

## 2021-07-05 ENCOUNTER — Encounter (HOSPITAL_COMMUNITY): Payer: Self-pay | Admitting: Emergency Medicine

## 2021-07-05 ENCOUNTER — Other Ambulatory Visit: Payer: Self-pay

## 2021-07-05 ENCOUNTER — Emergency Department (HOSPITAL_COMMUNITY): Payer: Medicare Other

## 2021-07-05 ENCOUNTER — Inpatient Hospital Stay (HOSPITAL_COMMUNITY)
Admission: EM | Admit: 2021-07-05 | Discharge: 2021-07-09 | DRG: 378 | Disposition: A | Discharge status: Home Health | Payer: Medicare Other | Attending: Family Medicine | Admitting: Family Medicine

## 2021-07-05 DIAGNOSIS — F17210 Nicotine dependence, cigarettes, uncomplicated: Secondary | ICD-10-CM | POA: Diagnosis present

## 2021-07-05 DIAGNOSIS — L209 Atopic dermatitis, unspecified: Secondary | ICD-10-CM | POA: Diagnosis not present

## 2021-07-05 DIAGNOSIS — R195 Other fecal abnormalities: Secondary | ICD-10-CM

## 2021-07-05 DIAGNOSIS — K254 Chronic or unspecified gastric ulcer with hemorrhage: Principal | ICD-10-CM | POA: Diagnosis present

## 2021-07-05 DIAGNOSIS — K921 Melena: Secondary | ICD-10-CM | POA: Diagnosis not present

## 2021-07-05 DIAGNOSIS — R9431 Abnormal electrocardiogram [ECG] [EKG]: Secondary | ICD-10-CM | POA: Diagnosis not present

## 2021-07-05 DIAGNOSIS — Z7901 Long term (current) use of anticoagulants: Secondary | ICD-10-CM | POA: Diagnosis not present

## 2021-07-05 DIAGNOSIS — E785 Hyperlipidemia, unspecified: Secondary | ICD-10-CM | POA: Diagnosis present

## 2021-07-05 DIAGNOSIS — E876 Hypokalemia: Secondary | ICD-10-CM | POA: Diagnosis present

## 2021-07-05 DIAGNOSIS — K449 Diaphragmatic hernia without obstruction or gangrene: Secondary | ICD-10-CM | POA: Diagnosis present

## 2021-07-05 DIAGNOSIS — K219 Gastro-esophageal reflux disease without esophagitis: Secondary | ICD-10-CM | POA: Diagnosis present

## 2021-07-05 DIAGNOSIS — E871 Hypo-osmolality and hyponatremia: Secondary | ICD-10-CM | POA: Diagnosis present

## 2021-07-05 DIAGNOSIS — Z66 Do not resuscitate: Secondary | ICD-10-CM | POA: Diagnosis not present

## 2021-07-05 DIAGNOSIS — I441 Atrioventricular block, second degree: Secondary | ICD-10-CM | POA: Diagnosis present

## 2021-07-05 DIAGNOSIS — E538 Deficiency of other specified B group vitamins: Secondary | ICD-10-CM

## 2021-07-05 DIAGNOSIS — Z20822 Contact with and (suspected) exposure to covid-19: Secondary | ICD-10-CM | POA: Diagnosis present

## 2021-07-05 DIAGNOSIS — M549 Dorsalgia, unspecified: Secondary | ICD-10-CM | POA: Diagnosis present

## 2021-07-05 DIAGNOSIS — K922 Gastrointestinal hemorrhage, unspecified: Secondary | ICD-10-CM | POA: Diagnosis present

## 2021-07-05 DIAGNOSIS — Z791 Long term (current) use of non-steroidal anti-inflammatories (NSAID): Secondary | ICD-10-CM

## 2021-07-05 DIAGNOSIS — I4891 Unspecified atrial fibrillation: Secondary | ICD-10-CM

## 2021-07-05 DIAGNOSIS — F0390 Unspecified dementia without behavioral disturbance: Secondary | ICD-10-CM | POA: Diagnosis present

## 2021-07-05 DIAGNOSIS — E119 Type 2 diabetes mellitus without complications: Secondary | ICD-10-CM | POA: Diagnosis present

## 2021-07-05 DIAGNOSIS — I491 Atrial premature depolarization: Secondary | ICD-10-CM | POA: Diagnosis not present

## 2021-07-05 DIAGNOSIS — Z7984 Long term (current) use of oral hypoglycemic drugs: Secondary | ICD-10-CM

## 2021-07-05 DIAGNOSIS — Z79899 Other long term (current) drug therapy: Secondary | ICD-10-CM

## 2021-07-05 DIAGNOSIS — R0600 Dyspnea, unspecified: Secondary | ICD-10-CM

## 2021-07-05 DIAGNOSIS — G8929 Other chronic pain: Secondary | ICD-10-CM | POA: Diagnosis present

## 2021-07-05 DIAGNOSIS — D500 Iron deficiency anemia secondary to blood loss (chronic): Secondary | ICD-10-CM | POA: Diagnosis present

## 2021-07-05 DIAGNOSIS — Z952 Presence of prosthetic heart valve: Secondary | ICD-10-CM

## 2021-07-05 DIAGNOSIS — D649 Anemia, unspecified: Secondary | ICD-10-CM

## 2021-07-05 DIAGNOSIS — R531 Weakness: Secondary | ICD-10-CM | POA: Diagnosis not present

## 2021-07-05 LAB — IRON AND TIBC
Iron: 47 ug/dL (ref 28–170)
Saturation Ratios: 13 % (ref 10.4–31.8)
TIBC: 353 ug/dL (ref 250–450)
UIBC: 306 ug/dL

## 2021-07-05 LAB — CBC
HCT: 31.9 % — ABNORMAL LOW (ref 36.0–46.0)
Hemoglobin: 10.8 g/dL — ABNORMAL LOW (ref 12.0–15.0)
MCH: 29 pg (ref 26.0–34.0)
MCHC: 33.9 g/dL (ref 30.0–36.0)
MCV: 85.5 fL (ref 80.0–100.0)
Platelets: 213 10*3/uL (ref 150–400)
RBC: 3.73 MIL/uL — ABNORMAL LOW (ref 3.87–5.11)
RDW: 13.5 % (ref 11.5–15.5)
WBC: 7.4 10*3/uL (ref 4.0–10.5)
nRBC: 0 % (ref 0.0–0.2)

## 2021-07-05 LAB — VITAMIN B12: Vitamin B-12: 169 pg/mL — ABNORMAL LOW (ref 180–914)

## 2021-07-05 LAB — BASIC METABOLIC PANEL
Anion gap: 12 (ref 5–15)
BUN: 43 mg/dL — ABNORMAL HIGH (ref 8–23)
CO2: 24 mmol/L (ref 22–32)
Calcium: 8.3 mg/dL — ABNORMAL LOW (ref 8.9–10.3)
Chloride: 98 mmol/L (ref 98–111)
Creatinine, Ser: 1 mg/dL (ref 0.44–1.00)
GFR, Estimated: 58 mL/min — ABNORMAL LOW (ref >60)
Glucose, Bld: 132 mg/dL — ABNORMAL HIGH (ref 70–99)
Potassium: 2.9 mmol/L — ABNORMAL LOW (ref 3.5–5.1)
Sodium: 134 mmol/L — ABNORMAL LOW (ref 135–145)

## 2021-07-05 LAB — FERRITIN: Ferritin: 5 ng/mL — ABNORMAL LOW (ref 11–307)

## 2021-07-05 LAB — D-DIMER, QUANTITATIVE: D-Dimer, Quant: 0.31 ug/mL-FEU (ref 0.00–0.50)

## 2021-07-05 LAB — RETICULOCYTES
Immature Retic Fract: 30.6 % — ABNORMAL HIGH (ref 2.3–15.9)
RBC.: 3.69 MIL/uL — ABNORMAL LOW (ref 3.87–5.11)
Retic Count, Absolute: 70.1 10*3/uL (ref 19.0–186.0)
Retic Ct Pct: 1.9 % (ref 0.4–3.1)

## 2021-07-05 LAB — ABO/RH
ABO/RH(D): A POS
ABO/RH(D): A POS

## 2021-07-05 LAB — FOLATE: Folate: 9.8 ng/mL (ref >5.9)

## 2021-07-05 LAB — PROTIME-INR
INR: >10 (ref 0.8–1.2)
Prothrombin Time: >90 seconds — ABNORMAL HIGH (ref 11.4–15.2)

## 2021-07-05 LAB — GLUCOSE, CAPILLARY
Glucose-Capillary: 108 mg/dL — ABNORMAL HIGH (ref 70–99)
Glucose-Capillary: 104 mg/dL — ABNORMAL HIGH (ref 70–99)

## 2021-07-05 LAB — HEMOGLOBIN A1C
Hgb A1c MFr Bld: 6.5 % — ABNORMAL HIGH (ref 4.8–5.6)
Mean Plasma Glucose: 139.85 mg/dL

## 2021-07-05 LAB — CBG MONITORING, ED: Glucose-Capillary: 116 mg/dL — ABNORMAL HIGH (ref 70–99)

## 2021-07-05 LAB — RESP PANEL BY RT-PCR (FLU A&B, COVID) ARPGX2
Influenza A by PCR: NEGATIVE
Influenza B by PCR: NEGATIVE
SARS Coronavirus 2 by RT PCR: NEGATIVE

## 2021-07-05 LAB — TROPONIN I (HIGH SENSITIVITY)
Troponin I (High Sensitivity): 11 ng/L (ref <18)
Troponin I (High Sensitivity): 11 ng/L (ref <18)

## 2021-07-05 LAB — POC OCCULT BLOOD, ED: Fecal Occult Bld: POSITIVE — AB

## 2021-07-05 LAB — BRAIN NATRIURETIC PEPTIDE: B Natriuretic Peptide: 40 pg/mL (ref 0.0–100.0)

## 2021-07-05 MED ORDER — VITAMIN K1 10 MG/ML IJ SOLN
5.0000 mg | Freq: Once | INTRAVENOUS | Status: AC
  Administered 2021-07-05: 5 mg via INTRAVENOUS
  Filled 2021-07-05: qty 0.5

## 2021-07-05 MED ORDER — ACETAMINOPHEN 325 MG PO TABS
650.0000 mg | ORAL_TABLET | Freq: Four times a day (QID) | ORAL | Status: DC | PRN
  Administered 2021-07-06: 650 mg via ORAL
  Filled 2021-07-05: qty 2

## 2021-07-05 MED ORDER — MEMANTINE HCL 10 MG PO TABS
10.0000 mg | ORAL_TABLET | Freq: Two times a day (BID) | ORAL | Status: DC
  Administered 2021-07-05 – 2021-07-09 (×7): 10 mg via ORAL
  Filled 2021-07-05 (×7): qty 1

## 2021-07-05 MED ORDER — DONEPEZIL HCL 5 MG PO TABS
10.0000 mg | ORAL_TABLET | Freq: Every day | ORAL | Status: DC
  Administered 2021-07-06 – 2021-07-08 (×3): 10 mg via ORAL
  Filled 2021-07-05 (×3): qty 2

## 2021-07-05 MED ORDER — POTASSIUM CHLORIDE 10 MEQ/100ML IV SOLN
10.0000 meq | INTRAVENOUS | Status: AC
Start: 2021-07-05 — End: 2021-07-05
  Administered 2021-07-05 (×2): 10 meq via INTRAVENOUS
  Filled 2021-07-05: qty 100

## 2021-07-05 MED ORDER — PANTOPRAZOLE SODIUM 40 MG IV SOLR
40.0000 mg | Freq: Two times a day (BID) | INTRAVENOUS | Status: DC
  Administered 2021-07-05 – 2021-07-09 (×8): 40 mg via INTRAVENOUS
  Filled 2021-07-05 (×8): qty 40

## 2021-07-05 MED ORDER — ONDANSETRON HCL 4 MG PO TABS
4.0000 mg | ORAL_TABLET | Freq: Four times a day (QID) | ORAL | Status: DC | PRN
  Administered 2021-07-08: 4 mg via ORAL
  Filled 2021-07-05: qty 1

## 2021-07-05 MED ORDER — INSULIN ASPART 100 UNIT/ML IJ SOLN
0.0000 [IU] | Freq: Three times a day (TID) | INTRAMUSCULAR | Status: DC
  Administered 2021-07-07: 2 [IU] via SUBCUTANEOUS
  Administered 2021-07-07 – 2021-07-09 (×2): 1 [IU] via SUBCUTANEOUS

## 2021-07-05 MED ORDER — QUETIAPINE FUMARATE 25 MG PO TABS
25.0000 mg | ORAL_TABLET | Freq: Every day | ORAL | Status: DC
  Administered 2021-07-05 – 2021-07-08 (×4): 25 mg via ORAL
  Filled 2021-07-05 (×4): qty 1

## 2021-07-05 MED ORDER — SODIUM CHLORIDE 0.9% IV SOLUTION: Freq: Once | INTRAVENOUS | Status: DC

## 2021-07-05 MED ORDER — ONDANSETRON HCL 4 MG/2ML IJ SOLN: 4.0000 mg | Freq: Four times a day (QID) | INTRAMUSCULAR | Status: DC | PRN

## 2021-07-05 MED ORDER — POTASSIUM CHLORIDE CRYS ER 20 MEQ PO TBCR
40.0000 meq | EXTENDED_RELEASE_TABLET | Freq: Once | ORAL | Status: AC
  Administered 2021-07-05: 40 meq via ORAL
  Filled 2021-07-05: qty 2

## 2021-07-05 MED ORDER — PANTOPRAZOLE SODIUM 40 MG IV SOLR
40.0000 mg | Freq: Once | INTRAVENOUS | Status: AC
  Administered 2021-07-05: 40 mg via INTRAVENOUS
  Filled 2021-07-05: qty 40

## 2021-07-05 MED ORDER — ACETAMINOPHEN 650 MG RE SUPP: 650.0000 mg | Freq: Four times a day (QID) | RECTAL | Status: DC | PRN

## 2021-07-05 MED ORDER — ATORVASTATIN CALCIUM 20 MG PO TABS
20.0000 mg | ORAL_TABLET | Freq: Every evening | ORAL | Status: DC
  Administered 2021-07-05 – 2021-07-08 (×4): 20 mg via ORAL
  Filled 2021-07-05 (×4): qty 1

## 2021-07-05 MED ORDER — ASPIRIN 81 MG PO CHEW
324.0000 mg | CHEWABLE_TABLET | Freq: Once | ORAL | Status: AC
  Administered 2021-07-05: 324 mg via ORAL
  Filled 2021-07-05: qty 4

## 2021-07-05 MED ORDER — SODIUM CHLORIDE 0.9 % IV SOLN: INTRAVENOUS | Status: DC

## 2021-07-05 MED ORDER — BOOST / RESOURCE BREEZE PO LIQD CUSTOM
1.0000 | Freq: Three times a day (TID) | ORAL | Status: DC
  Administered 2021-07-06 – 2021-07-09 (×7): 1 via ORAL

## 2021-07-05 MED ORDER — ISOSORBIDE MONONITRATE ER 60 MG PO TB24
30.0000 mg | ORAL_TABLET | Freq: Every day | ORAL | Status: DC
  Administered 2021-07-06 – 2021-07-09 (×3): 30 mg via ORAL
  Filled 2021-07-05 (×3): qty 1

## 2021-07-05 NOTE — Progress Notes (Signed)
CRITICAL VALUE STICKER  CRITICAL VALUE: INR 10  DATE & TIME NOTIFIED:   MESSENGER (representative from lab):  MD NOTIFIED: TAT  TIME OF NOTIFICATION: 1605  RESPONSE: New Orders

## 2021-07-05 NOTE — Consult Note (Addendum)
@LOGO @   Referring Provider: , MD Primary Care Physician:  Alvis Lemmings, FNP Primary Gastroenterologist:  Dr. Marylynn Pearson (previously unassigned)  Date of Admission: 07/05/21 Date of Consultation: 07/05/21  Reason for Consultation: GI bleed   HPI:  Kathleen Barnett is a 80 y.o. 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  ED course: Hemodynamically stable. Hemoglobin 10.8, down from 12.9, 1-year ago.  Stool heme positive.  BUN elevated at 43, creatinine 1.0.  Potassium low at 2.9.  Sodium slightly low at 134.  Troponin x2 negative. Respiratory panel negative for COVID-19 and influenza A/B.   D-dimer normal. BNP normal. DG chest with evidence of valve replacement, mild cardiomegaly. CT head without contrast with no acute findings.  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  Mild to moderate generalized cerebral atrophy and cerebral white matter chronic small vessel ischemic disease.   Today:  Weakness in her legs over the last 2-3 days. Associated episodes of feeling like her vision is fading away when up moving around. Intermittent SOB for the last 2-3 months. SOB is associated with activity. No SOB at rest. No cough. No CP. Smokes 1-2 cigarettes daily. No known lung problems. [place] Moved [place] several years ago. Heart valve was in [place]. Can't remember why the valve was replaced. Denies taking Coumadin at this time. Can't remember when she took it last. Lives with her son in law and daughter.   Notes black stools daily starting 3-4 days ago. Stools are loose. 1-2 Bms a day. Never had black stools before. No bright red blood. No iron or pepto bismol. Occasional Advil if she gets a headache, but hasn't taken in a long time. No real chronic pain issues.   History of GERD, but hasn't had any trouble with  it lately. Used to take Prilosec but hasn't taken this in a while. No dysphagia, nausea, vomiting, or abdominal pain.   No alcohol.  No illicit drug use.   Can't remember if she had a colonoscopy. No prior EGD.   Not sure about her current medications.  Does not think she is taking anything.  Unable to tell me where we are. Not sure if she is in the hospital or doctors office.  Knows year. Not sure who president is.   Spoke with patient's daughter over the telephone: Daughter reports patient has dementia and a lot of trouble with her memory.  Patient is taking 1.5 tablets of warfarin due to history of heart valve replacement.  Warfarin was actually reduced a couple of months ago due to an elevated INR.  Reports patient had a nose bleed this morning.  Daughter does not help patient in the bathroom so she is unaware of black stools or blood in the stool.  Patient does takes omeprazole 40 mg every other day. Takes 2 naproxen daily for back pain.  Valve replacement 6-7 years ago.   Reports patient has DNR order.  Also concerned about a rash under her left breast. States patient is able to sign her own consents.  Daughter is agreeable to EGD when medically appropriate.  Past Medical History:  Diagnosis Date   Diabetes mellitus without complication [facility])     Past Surgical History:  Procedure Laterality Date   ABDOMINAL HYSTERECTOMY     APPENDECTOMY     CATARACT EXTRACTION W/PHACO Right 12/29/2019  Procedure: CATARACT EXTRACTION PHACO AND INTRAOCULAR LENS PLACEMENT RIGHT EYE;  Surgeon: Fabio Pierce, MD;  Location: [facility];  Service: Ophthalmology;  Laterality: Right;  CDE: 18.10   CATARACT EXTRACTION W/PHACO Left 01/26/2020   Procedure: CATARACT EXTRACTION PHACO AND INTRAOCULAR LENS PLACEMENT (IOC);  Surgeon: Fabio Pierce, MD;  Location: [facility];  Service: Ophthalmology;  Laterality: Left;  CDE: 8.22   CHOLECYSTECTOMY      Prior to Admission medications   Medication Sig Start Date End  Date Taking? Authorizing Provider  albuterol (VENTOLIN HFA) 108 (90 Base) MCG/ACT inhaler Inhale 2 puffs into the lungs every 6 (six) hours as needed for wheezing or shortness of breath.    Yes [provider]  atorvastatin (LIPITOR) 20 MG tablet Take 20 mg by mouth every evening.    Yes [provider]  donepezil (ARICEPT) 10 MG tablet Take 10 mg by mouth daily.   Yes [provider]  ibuprofen (ADVIL) 200 MG tablet Take 400 mg by mouth daily as needed for moderate pain.   Yes [provider]  isosorbide mononitrate (IMDUR) 30 MG 24 hr tablet Take 30 mg by mouth daily.    Yes [provider]  memantine (NAMENDA) 10 MG tablet Take 10 mg by mouth 2 (two) times daily.   Yes [provider]  metFORMIN (GLUCOPHAGE) 500 MG tablet Take 500 mg by mouth 2 (two) times daily with a meal.   Yes [provider]  naproxen (NAPROSYN) 500 MG tablet Take 500 mg by mouth 2 (two) times daily.   Yes [provider]  omeprazole (PRILOSEC) 40 MG capsule Take 40 mg by mouth daily. 01/15/20  Yes [provider]  QUEtiapine (SEROQUEL) 25 MG tablet Take 25 mg by mouth at bedtime. 03/04/21  Yes [provider]  traMADol (ULTRAM) 50 MG tablet Take 50 mg by mouth daily as needed (pain).    Yes [provider]  traZODone (DESYREL) 150 MG tablet Take 300 mg by mouth at bedtime.    Yes [provider]  warfarin (COUMADIN) 5 MG tablet Take 7.5 mg by mouth daily.   Yes [provider]    Current Facility-Administered Medications  Medication Dose Route Frequency Provider Last Rate Last Admin   pantoprazole (PROTONIX) injection 40 mg  40 mg Intravenous Once Linwood Dibbles, MD       potassium chloride SA (KLOR-CON) CR tablet 40 mEq  40 mEq Oral Once Linwood Dibbles, MD       Current Outpatient Medications  Medication Sig Dispense Refill   albuterol (VENTOLIN HFA) 108 (90 Base) MCG/ACT inhaler Inhale 2 puffs into the lungs  every 6 (six) hours as needed for wheezing or shortness of breath.      atorvastatin (LIPITOR) 20 MG tablet Take 20 mg by mouth every evening.      donepezil (ARICEPT) 10 MG tablet Take 10 mg by mouth daily.     ibuprofen (ADVIL) 200 MG tablet Take 400 mg by mouth daily as needed for moderate pain.     isosorbide mononitrate (IMDUR) 30 MG 24 hr tablet Take 30 mg by mouth daily.      memantine (NAMENDA) 10 MG tablet Take 10 mg by mouth 2 (two) times daily.     metFORMIN (GLUCOPHAGE) 500 MG tablet Take 500 mg by mouth 2 (two) times daily with a meal.     naproxen (NAPROSYN) 500 MG tablet Take 500 mg by mouth 2 (two) times daily.     omeprazole (PRILOSEC) 40  MG capsule Take 40 mg by mouth daily.     QUEtiapine (SEROQUEL) 25 MG tablet Take 25 mg by mouth at bedtime.     traMADol (ULTRAM) 50 MG tablet Take 50 mg by mouth daily as needed (pain).      traZODone (DESYREL) 150 MG tablet Take 300 mg by mouth at bedtime.      warfarin (COUMADIN) 5 MG tablet Take 7.5 mg by mouth daily.      Allergies as of 07/05/2021   (No Known Allergies)    History reviewed. No pertinent family history.  Social History   Socioeconomic History   Marital status: Single    Spouse name: Not on file   Number of children: Not on file   Years of education: Not on file   Highest education level: Not on file  Occupational History   Not on file  Tobacco Use   Smoking status: Every Day    Packs/day: 0.50    Types: Cigarettes   Smokeless tobacco: Never  Vaping Use   Vaping Use: Never used  Substance and Sexual Activity   Alcohol use: Not Currently   Drug use: Never   Sexual activity: Not on file  Other Topics Concern   Not on file  Social History Narrative   Not on file   Social Determinants of Health   Financial Resource Strain: Not on file  Food Insecurity: Not on file  Transportation Needs: Not on file  Physical Activity: Not on file  Stress: Not on file  Social Connections: Not on file  Intimate  Partner Violence: Not on file    Review of Systems: Gen: Denies fever, chills, cold or flulike symptoms, presyncope, syncope. CV: Denies chest pain or palpitations. Resp: See HPI GI: See HPI GU : Denies urinary burning, urinary frequency, urinary incontinence.  MS: Denies joint pain Derm: Denies rash though daughter reports rash under left breast after my interview with patient.  Psych: Denies depression, anxiety Heme: See HPI  Physical Exam: Vital signs in last 24 hours: Temp:  [98.3 F (36.8 C)] 98.3 F (36.8 C) (07/19 0926) Pulse Rate:  [71-80] 77 (07/19 1030) Resp:  [16-20] 16 (07/19 1030) BP: (113-141)/(61-106) 113/100 (07/19 1030) SpO2:  [93 %-99 %] 97 % (07/19 1030) Weight:  [90.7 kg] 90.7 kg (07/19 0809)   General:   Alert, well-developed, well-nourished, pleasant and cooperative in NAD Head:  Normocephalic and atraumatic. Eyes:  Sclera clear, no icterus.   Conjunctiva pink. Ears:  Normal auditory acuity. Nose:  No deformity, discharge,  or lesions. Lungs:  Clear throughout to auscultation.   No wheezes, crackles, or rhonchi. No acute distress. Heart:  Regular rate and rhythm; no murmurs, clicks, rubs,  or gallops. Abdomen:  Soft, nontender and nondistended. No masses, hepatosplenomegaly or hernias noted. Normal bowel sounds, without guarding, and without rebound.   Rectal:  Deferred Msk:  Symmetrical without gross deformities. Normal posture. Extremities:  Without edema. Neurologic:  Alert and  oriented to self, time, and situation. Unable to tell me where we are or who the president is.  Skin:  Intact without significant lesions or rashes. Psych:  Normal mood and affect.  Intake/Output from previous day: No intake/output data recorded. Intake/Output this shift: No intake/output data recorded.  Lab Results: Recent Labs    07/05/21 0855  WBC 7.4  HGB 10.8*  HCT 31.9*  PLT 213   BMET Recent Labs    07/05/21 0855  NA 134*  K 2.9*  CL 98  CO2  24   GLUCOSE 132*  BUN 43*  CREATININE 1.00  CALCIUM 8.3*    Studies/Results: CT Head Wo Contrast  Result Date: 07/05/2021 CLINICAL DATA:  Neuro deficit, acute, stroke suspected. Additional history provided: Patient reports increased weakness for 2 weeks. EXAM: CT HEAD WITHOUT CONTRAST TECHNIQUE: Contiguous axial images were obtained from the base of the skull through the vertex without intravenous contrast. COMPARISON:  Brain MRI 09/04/2019. FINDINGS: Brain: Mild-to-moderate generalized cerebral atrophy. 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 Mild-to-moderate patchy and ill-defined hypoattenuation within the cerebral white matter, nonspecific but compatible chronic small vessel ischemic disease. There is no acute intracranial hemorrhage. No demarcated cortical infarct. No extra-axial fluid collection. No evidence of an intracranial mass. No midline shift. Vascular: No hyperdense vessel.  Atherosclerotic calcifications Skull: Normal. Negative for fracture or focal lesion. Sinuses/Orbits: Visualized orbits show no acute finding. No significant paranasal sinus disease at the imaged levels. 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 Electronically Signed   By: Jackey Loge DO   On: 07/05/2021 09:38   DG Chest Portable 1 View  Result Date: 07/05/2021 CLINICAL DATA:  Weakness.  Shortness of breath. EXAM: PORTABLE CHEST 1 VIEW COMPARISON:  08/09/2019. FINDINGS: Mediastinum and hilar structures normal. Prior cardiac valve replacement. Mild cardiomegaly. No pulmonary venous congestion. Calcified pulmonary nodules again noted consistent with granulomas. No focal infiltrate. No pleural effusion or  pneumothorax. IMPRESSION: Valve replacement. Mild cardiomegaly. No pulmonary venous congestion. No acute pulmonary disease. 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  She was found to have hemoglobin 10.8, down from 12.9, 1-year ago, stool heme positive, BUN elevated at 43, creatinine 1.0, potassium 2.9.  Troponins negative x2.  BNP normal.  No acute findings on chest x-ray or CT head. She has remained hemodynamically stable in the ED.  Symptomatic anemia: Hemoglobin 10.8, down from 12.9, 1 year ago.  Stool heme positive.  Patient reports melanotic stools for the last 3 to 4 days.  Per patient's daughter, patient takes naproxen twice daily for chronic pain.  No other NSAIDs.  Also on omeprazole 40 mg every other day for chronic history of reflux.  Denies history of alcohol or illicit drug use.  She does smoke cigarettes.  Concern for upper GI bleed in the setting of NSAIDs and warfarin with differentials including esophagitis, gastritis, duodenitis, PUD, AVMs. Small bowel etiology remains in consideration as well as right-sided colonic lesion as she has not had a colonoscopy in many years.   Would recommend starting with EGD for further evaluation, timing to be determined in light of chronic anticoagulation and hypokalemia.  We will check INR and start IV PPI twice daily.   Replacement of potassium per hospitalist.   Rash: After speaking with daughter over the phone, she reports rash under patient's left breast.  This was not evaluated during my interview with the patient. I have notified Dr. Arbutus Leas.  *Patient's daughter informed me that patient has a DNR order.  I have notified Dr. Arbutus Leas  Plan: INR PPI IV twice daily Needs EGD for further evaluation  of symptomatic  anemia, heme positive stool, and melena.  Timing to be determined in light of chronic anticoagulation and hypokalemia. Correction of hypokalemia per hospitalist. Monitor H&H and for overt GI bleeding. Clear liquid diet today.  Hold warfarin   LOS: 0 days    07/05/2021, 12:09 PM   Ermalinda Memos, [facility]

## 2021-07-05 NOTE — H&P (Addendum)
History and Physical  Kathleen Barnett OHF:[id number] DOB: Jan 19, 1941 DOA: 07/05/2021   PCP: Marylynn Pearson, FNP   Patient coming from: Home  Chief Complaint:generalized weakness  HPI:  Kathleen Barnett is a 80 y.o. 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  Patient has had some near syncopal episodes of intermittently feeling dizzy.  She has had some intermittent chest discomfort with exertion lasting about 5 to 10 minutes.  She states that it improves when she rests.  She denies any worsening peripheral edema, orthopnea, PND.  She denies any fevers, chills, headache, neck pain, coughing, hemoptysis.  The patient is unable to find any further history regarding her heart valve.  She denies any abdominal pain, but states that she has been having some occasional melanotic stool over the past 2 to 3 weeks.  She denies any frank hematochezia, dysuria, hematuria.  She denies any visual disturbance or focal extremity weakness. In the emergency department, the patient was afebrile and hemodynamically stable with oxygen saturation 98% on room air.  BMP showed a sodium of 134, potassium 2.9, serum creatinine 1.00.  WBC 7.4, hemoglobin 10.8, platelets 213,000.  D-dimer was 0.31.  BNP was 40.  INR >10. Troponin 11>>11.  Chest x-ray showed mild cardiomegaly without any infiltrates or edema.  FOBT was positive.  CT of the brain was negative for acute findings.  GI was consulted to assist with management.  Cardiology was consulted also for abnormal EKG.  Assessment/Plan: Melena/symptomatic anemia/FOBT+ -Patient presented with hemoglobin 10.8 -Baseline hemoglobin 12-13 -GI consulted -Start PPI bid -Clear liquid diet for now -Check anemia panel  Cardiac  dysrhythmia/Atrial fibrillation, type unspecifiec -Concern for Mobitz type II AV block on EKG -Cardiology consult--spoke with Dr. Deforest Hoyles -Optimize electrolytes -Remain on telemetry -no pauses noted thus far  Coagulopathy -INR>10 -give vitamin K 5 mg IV -FFP x 1 unit -repeat INR in am  Chest discomfort -troponins 11>>11 -Echocardiogram  Diabetes mellitus type 2 -Holding metformin -NovoLog sliding scale -Check A1c  History of heart valve replacement -daughter to bring in card to clarify details  Cognitive impairment/dementia -Continue Aricept and Namenda -Continue Seroquel at bedtime  Hyperlipidemia -Continue statin  Hypokalemia -replete -check mag  Left Breast Rash -chaperone present with Abigail, NT -appears to have small patch of atopic dermatitis under left breast--no signs of infection -prn topical hydrocortisone        Past Medical History:  Diagnosis Date   Diabetes mellitus without complication (HCC)    Past Surgical History:  Procedure Laterality Date   ABDOMINAL HYSTERECTOMY     APPENDECTOMY     CATARACT EXTRACTION W/PHACO Right 12/29/2019   Procedure: CATARACT EXTRACTION PHACO AND INTRAOCULAR LENS PLACEMENT RIGHT EYE;  Surgeon: Fabio Pierce, MD;  Location: [facility];  Service: Ophthalmology;  Laterality: Right;  CDE: 18.10   CATARACT EXTRACTION W/PHACO Left 01/26/2020   Procedure: CATARACT EXTRACTION PHACO AND INTRAOCULAR LENS PLACEMENT (IOC);  Surgeon: Fabio Pierce, MD;  Location: [facility];  Service: Ophthalmology;  Laterality: Left;  CDE: 8.22   CHOLECYSTECTOMY     Social History:  reports that she has been smoking cigarettes. She has been smoking an average of .5 packs per day. She has never used smokeless tobacco. She reports previous alcohol use. She reports that she does not use drugs.  Family History  Problem Relation Age of Onset   Colon cancer Neg Hx      No Known Allergies   Prior to Admission medications   Medication Sig Start  Date End Date Taking? Authorizing Provider  albuterol (VENTOLIN HFA) 108 (90 Base) MCG/ACT inhaler Inhale 2 puffs into the lungs every 6 (six) hours as needed for wheezing or shortness of breath.    Yes [provider]  atorvastatin (LIPITOR) 20 MG tablet Take 20 mg by mouth every evening.    Yes [provider]  donepezil (ARICEPT) 10 MG tablet Take 10 mg by mouth daily.   Yes [provider]  ibuprofen (ADVIL) 200 MG tablet Take 400 mg by mouth daily as needed for moderate pain.   Yes [provider]  isosorbide mononitrate (IMDUR) 30 MG 24 hr tablet Take 30 mg by mouth daily.    Yes [provider]  memantine (NAMENDA) 10 MG tablet Take 10 mg by mouth 2 (two) times daily.   Yes [provider]  metFORMIN (GLUCOPHAGE) 500 MG tablet Take 500 mg by mouth 2 (two) times daily with a meal.   Yes [provider]  naproxen (NAPROSYN) 500 MG tablet Take 500 mg by mouth 2 (two) times daily.   Yes [provider]  omeprazole (PRILOSEC) 40 MG capsule Take 40 mg by mouth daily. 01/15/20  Yes [provider]  QUEtiapine (SEROQUEL) 25 MG tablet Take 25 mg by mouth at bedtime. 03/04/21  Yes [provider]  traMADol (ULTRAM) 50 MG tablet Take 50 mg by mouth daily as needed (pain).    Yes [provider]  traZODone (DESYREL) 150 MG tablet Take 300 mg by mouth at bedtime.    Yes [provider]  warfarin (COUMADIN) 5 MG tablet Take 7.5 mg by mouth daily.   Yes [provider]    Review of Systems:  Constitutional:  No weight loss, night sweats, Fevers, chills, fatigue.  Head&Eyes: No headache.  No vision loss.  No eye pain or scotoma ENT:  No Difficulty swallowing,Tooth/dental problems,Sore throat,  No ear ache, post nasal drip,  Cardio-vascular:  No Orthopnea, PND, swelling in lower extremities,  dizziness, palpitations  GI:  No  abdominal pain, nausea, vomiting, diarrhea, loss of appetite,  hematochezia, melena, heartburn, indigestion, Resp:  No  No cough. No coughing up of blood .No wheezing.No chest wall deformity  Skin:  no rash or lesions.  GU:  no dysuria, change in color of urine, no urgency or frequency. No flank pain.  Musculoskeletal:  No joint pain or swelling. No decreased range of motion. No back pain.  Psych:  No change in mood or affect. No depression or anxiety. Neurologic: No headache, no dysesthesia, no focal weakness, no vision loss. No syncope  Physical Exam: Vitals:   07/05/21 0930 07/05/21 1000 07/05/21 1030 07/05/21 1205  BP: 134/68 (!) 141/61 (!) 113/100 121/89  Pulse: 72 71 77 74  Resp: 17 18 16 16   Temp:      TempSrc:      SpO2: 99% 98% 97% 97%  Weight:      Height:       General:  A&O x 3, NAD, nontoxic, pleasant/cooperative Head/Eye: No conjunctival hemorrhage, no icterus, [place]/AT, No nystagmus ENT:  No icterus,  No thrush, good dentition, no pharyngeal exudate Neck:  No masses, no lymphadenpathy, no bruits CV:  RRR, no rub, no gallop, no S3 Lung:  CTAB, good air movement, no wheeze, no rhonchi  Abdomen: soft/NT, +BS, nondistended, no peritoneal signs Ext: No cyanosis, No rashes, No petechiae, No lymphangitis, No edema Neuro: CNII-XII intact, strength 4/5 in bilateral upper and lower extremities, no dysmetria  Labs on Admission:  Basic Metabolic Panel: Recent Labs  Lab 07/05/21 0855  NA 134*  K 2.9*  CL 98  CO2 24  GLUCOSE 132*  BUN 43*  CREATININE 1.00  CALCIUM 8.3*   Liver Function Tests: No results for input(s): AST, ALT, ALKPHOS, BILITOT, PROT, ALBUMIN in the last 168 hours. No results for input(s): LIPASE, AMYLASE in the last 168 hours. No results for input(s): AMMONIA in the last 168 hours. CBC: Recent Labs  Lab 07/05/21 0855  WBC 7.4  HGB 10.8*  HCT 31.9*  MCV 85.5  PLT 213   Coagulation Profile: No results for input(s): INR, PROTIME in the last 168 hours. Cardiac Enzymes: No results for input(s): CKTOTAL,  CKMB, CKMBINDEX, TROPONINI in the last 168 hours. BNP: Invalid input(s): POCBNP CBG: Recent Labs  Lab 07/05/21 0924  GLUCAP 116*   Urine analysis:    Component Value Date/Time   COLORURINE AMBER (A) 08/09/2019 1838   APPEARANCEUR HAZY (A) 08/09/2019 1838   LABSPEC 1.036 (H) 08/09/2019 1838   PHURINE 5.0 08/09/2019 1838   GLUCOSEU NEGATIVE 08/09/2019 1838   HGBUR NEGATIVE 08/09/2019 1838   BILIRUBINUR SMALL (A) 08/09/2019 1838   KETONESUR 5 (A) 08/09/2019 1838   PROTEINUR 30 (A) 08/09/2019 1838   NITRITE NEGATIVE 08/09/2019 1838   LEUKOCYTESUR MODERATE (A) 08/09/2019 1838   Sepsis Labs: @LABRCNTIP (procalcitonin:4,lacticidven:4) ) Recent Results (from the past 240 hour(s))  Resp Panel by RT-PCR (Flu A&B, Covid) Nasopharyngeal Swab     Status: None   Collection Time: 07/05/21  8:55 AM   Specimen: Nasopharyngeal Swab; Nasopharyngeal(NP) swabs in vial transport medium  Result Value Ref Range Status   SARS Coronavirus 2 by RT PCR NEGATIVE NEGATIVE Final    Comment: (NOTE) SARS-CoV-2 target nucleic acids are NOT DETECTED.  The SARS-CoV-2 RNA is generally detectable in upper respiratory specimens during the acute phase of infection. The lowest concentration of SARS-CoV-2 viral copies this assay can detect is 138 copies/mL. A negative result does not preclude SARS-Cov-2 infection and should not be used as the sole basis for treatment or other patient management decisions. A negative result may occur with  improper specimen collection/handling, submission of specimen other than nasopharyngeal swab, presence of viral mutation(s) within the areas targeted by this assay, and inadequate number of viral copies(<138 copies/mL). A negative result must be combined with clinical observations, patient history, and epidemiological information. The expected result is Negative.  Fact Sheet for Patients:  07/07/21  Fact Sheet for Healthcare Providers:   BloggerCourse.com  This test is no t yet approved or cleared by the [place] FDA and  has been authorized for detection and/or diagnosis of SARS-CoV-2 by FDA under an Emergency Use Authorization (EUA). This EUA will remain  in effect (meaning this test can be used) for the duration of the COVID-19 declaration under Section 564(b)(1) of the Act, 21 U.S.C.section 360bbb-3(b)(1), unless the authorization is terminated  or revoked sooner.       Influenza A by PCR NEGATIVE NEGATIVE Final   Influenza B by PCR NEGATIVE NEGATIVE Final    Comment: (NOTE) The Xpert Xpress SARS-CoV-2/FLU/RSV plus assay is intended as an aid in the diagnosis of influenza from Nasopharyngeal swab specimens and should not be used as a sole basis for treatment. Nasal washings and aspirates are unacceptable for Xpert  Xpress SARS-CoV-2/FLU/RSV testing.  Fact Sheet for Patients: BloggerCourse.com  Fact Sheet for Healthcare Providers: [employer]ker[employer]  This test is not yet approved or cleared by the [place] FDA and has been authorized for detection and/or diagnosis of SARS-CoV-2 by FDA under an Emergency Use Authorization (EUA). This EUA will remain in effect (meaning this test can be used) for the duration of the COVID-19 declaration under Section 564(b)(1) of the Act, 21 U.S.C. section 360bbb-3(b)(1), unless the authorization is terminated or revoked.  Performed at [facility], [address], [place], [place] [address]      Radiological Exams on Admission: CT Head Wo Contrast  Result Date: 07/05/2021 CLINICAL DATA:  Neuro deficit, acute, stroke suspected. Additional history provided: Patient reports increased weakness for 2 weeks. EXAM: CT HEAD WITHOUT CONTRAST TECHNIQUE: Contiguous axial images were obtained from the base of the skull through the vertex without intravenous contrast. COMPARISON:  Brain MRI 09/04/2019.  FINDINGS: Brain: Mild-to-moderate generalized cerebral atrophy. 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 Mild-to-moderate patchy and ill-defined hypoattenuation within the cerebral white matter, nonspecific but compatible chronic small vessel ischemic disease. There is no acute intracranial hemorrhage. No demarcated cortical infarct. No extra-axial fluid collection. No evidence of an intracranial mass. No midline shift. Vascular: No hyperdense vessel.  Atherosclerotic calcifications Skull: Normal. Negative for fracture or focal lesion. Sinuses/Orbits: Visualized orbits show no acute finding. No significant paranasal sinus disease at the imaged levels. 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 Electronically Signed   By: Jackey Loge DO   On: 07/05/2021 09:38   DG Chest Portable 1 View  Result Date: 07/05/2021 CLINICAL DATA:  Weakness.  Shortness of breath. EXAM: PORTABLE CHEST 1 VIEW COMPARISON:  08/09/2019. FINDINGS: Mediastinum and hilar structures normal. Prior cardiac valve replacement. Mild cardiomegaly. No pulmonary venous congestion. Calcified pulmonary nodules again noted consistent with granulomas. No focal infiltrate. No pleural effusion or pneumothorax. IMPRESSION: Valve replacement. Mild cardiomegaly. No pulmonary venous congestion. No acute pulmonary disease. Electronically Signed   By: Maisie Fus  Register   On: 07/05/2021 09:24    EKG: Independently reviewed. Sinus ??mobitz type 2 AVB    Time spent:60 minutes Code Status:   FULL Family Communication:  No Family at bedside Disposition Plan: expect 1-2 day hospitalization Consults called: GI, cardiology DVT Prophylaxis: Brave  Lovenox  Catarina Hartshorn, DO  [facility] Pager [phone]  If 7PM-7AM, please contact night-coverage [email or web address] Password [email or web address] 07/05/2021, 1:18 PM

## 2021-07-05 NOTE — ED Provider Notes (Signed)
[facility] EMERGENCY DEPARTMENT Provider Note   CSN: [id number] Arrival date & time: 07/05/21  0753     History Chief Complaint  Patient presents with   Weakness    Kathleen Barnett is a 80 y.o. female.   Weakness  Patient presented to the ED for evaluation of weakness and intermittent difficulty breathing.  Patient states the symptoms started a couple weeks ago.  She does not think they are too severe but her family was concerned.  She has had some episodes of shortness of breath intermittently.  Somewhat related to activities but not ways.  She had some intermittent sharp pains in her chest.  Nothing particularly seems to trigger it she is not having any symptoms right now.  Patient also has felt that she has had a little bit more difficulty with her ambulation but she denies any trouble with her speech.  No focal numbness or weakness.  Patient did not think it was too severe but her family wanted her to get it checked out.  She denies any fevers or chills.  No urinary symptoms  Past Medical History:  Diagnosis Date   Diabetes mellitus without complication (HCC)     There are no problems to display for this patient.   Past Surgical History:  Procedure Laterality Date   ABDOMINAL HYSTERECTOMY     APPENDECTOMY     CATARACT EXTRACTION W/PHACO Right 12/29/2019   Procedure: CATARACT EXTRACTION PHACO AND INTRAOCULAR LENS PLACEMENT RIGHT EYE;  Surgeon: Fabio Pierce, MD;  Location: [facility];  Service: Ophthalmology;  Laterality: Right;  CDE: 18.10   CATARACT EXTRACTION W/PHACO Left 01/26/2020   Procedure: CATARACT EXTRACTION PHACO AND INTRAOCULAR LENS PLACEMENT (IOC);  Surgeon: Fabio Pierce, MD;  Location: [facility];  Service: Ophthalmology;  Laterality: Left;  CDE: 8.22   CHOLECYSTECTOMY       OB History   No obstetric history on file.     History reviewed. No pertinent family history.  Social History   Tobacco Use   Smoking status: Every Day    Packs/day: 0.50    Types:  Cigarettes   Smokeless tobacco: Never  Vaping Use   Vaping Use: Never used  Substance Use Topics   Alcohol use: Not Currently   Drug use: Never    Home Medications Prior to Admission medications   Medication Sig Start Date End Date Taking? Authorizing Provider  albuterol (VENTOLIN HFA) 108 (90 Base) MCG/ACT inhaler Inhale 2 puffs into the lungs every 6 (six) hours as needed for wheezing or shortness of breath.    Yes [provider]  atorvastatin (LIPITOR) 20 MG tablet Take 20 mg by mouth every evening.    Yes [provider]  donepezil (ARICEPT) 10 MG tablet Take 10 mg by mouth daily.   Yes [provider]  ibuprofen (ADVIL) 200 MG tablet Take 400 mg by mouth daily as needed for moderate pain.   Yes [provider]  isosorbide mononitrate (IMDUR) 30 MG 24 hr tablet Take 30 mg by mouth daily.    Yes [provider]  memantine (NAMENDA) 10 MG tablet Take 10 mg by mouth 2 (two) times daily.   Yes [provider]  metFORMIN (GLUCOPHAGE) 500 MG tablet Take 500 mg by mouth 2 (two) times daily with a meal.   Yes [provider]  naproxen (NAPROSYN) 500 MG tablet Take 500 mg by mouth 2 (two) times daily.   Yes [provider]  omeprazole (PRILOSEC) 40 MG capsule Take 40  mg by mouth daily. 01/15/20  Yes [provider]  QUEtiapine (SEROQUEL) 25 MG tablet Take 25 mg by mouth at bedtime. 03/04/21  Yes [provider]  traMADol (ULTRAM) 50 MG tablet Take 50 mg by mouth daily as needed (pain).    Yes [provider]  traZODone (DESYREL) 150 MG tablet Take 300 mg by mouth at bedtime.    Yes [provider]  warfarin (COUMADIN) 5 MG tablet Take 7.5 mg by mouth daily.   Yes [provider]    Allergies    Patient has no known allergies.  Review of Systems   Review of Systems  Gastrointestinal:        Stool color has been dark recently  Neurological:  Positive for weakness.  All other  systems reviewed and are negative.  Physical Exam Updated Vital Signs BP (!) 113/100   Pulse 77   Temp 98.3 F (36.8 C) (Oral)   Resp 16   Ht 1.702 m (5\' 7" )   Wt 90.7 kg   SpO2 97%   BMI 31.32 kg/m   Physical Exam Vitals and nursing note reviewed.  Constitutional:      General: She is not in acute distress.    Appearance: She is well-developed.  HENT:     Head: Normocephalic and atraumatic.     Right Ear: External ear normal.     Left Ear: External ear normal.  Eyes:     General: No scleral icterus.       Right eye: No discharge.        Left eye: No discharge.     Conjunctiva/sclera: Conjunctivae normal.  Neck:     Trachea: No tracheal deviation.  Cardiovascular:     Rate and Rhythm: Normal rate and regular rhythm.  Pulmonary:     Effort: Pulmonary effort is normal. No respiratory distress.     Breath sounds: Normal breath sounds. No stridor. No wheezing or rales.  Abdominal:     General: Bowel sounds are normal. There is no distension.     Palpations: Abdomen is soft.     Tenderness: There is no abdominal tenderness. There is no guarding or rebound.  Musculoskeletal:        General: No tenderness or deformity.     Cervical back: Neck supple.  Skin:    General: Skin is warm and dry.     Findings: No rash.  Neurological:     General: No focal deficit present.     Mental Status: She is alert.     Cranial Nerves: No cranial nerve deficit (no facial droop, extraocular movements intact, no slurred speech).     Sensory: No sensory deficit.     Motor: No abnormal muscle tone or seizure activity.     Coordination: Coordination normal.  Psychiatric:        Mood and Affect: Mood normal.    ED Results / Procedures / Treatments   Labs (all labs ordered are listed, but only abnormal results are displayed) Labs Reviewed  BASIC METABOLIC PANEL - Abnormal; Notable for the following components:      Result Value   Sodium 134 (*)    Potassium 2.9 (*)    Glucose, Bld  132 (*)    BUN 43 (*)    Calcium 8.3 (*)    GFR, Estimated 58 (*)    All other components within normal limits  CBC - Abnormal; Notable for the following components:   RBC 3.73 (*)  Hemoglobin 10.8 (*)    HCT 31.9 (*)    All other components within normal limits  CBG MONITORING, ED - Abnormal; Notable for the following components:   Glucose-Capillary 116 (*)    All other components within normal limits  POC OCCULT BLOOD, ED - Abnormal; Notable for the following components:   Fecal Occult Bld POSITIVE (*)    All other components within normal limits  RESP PANEL BY RT-PCR (FLU A&B, COVID) ARPGX2  BRAIN NATRIURETIC PEPTIDE  D-DIMER, QUANTITATIVE  TROPONIN I (HIGH SENSITIVITY)  TROPONIN I (HIGH SENSITIVITY)    EKG EKG Interpretation  Date/Time:  Tuesday July 05 2021 08:17:46 EDT Ventricular Rate:  102 PR Interval:  155 QRS Duration: 97 QT Interval:  355 QTC Calculation: 463 R Axis:   -32 Text Interpretation: Second degree AV block, Mobitz II , new since last tracing Left axis deviation Low voltage, extremity leads Nonspecific T abnormalities, lateral leads Confirmed by Linwood Dibbles [phone]) on 07/05/2021 8:24:30 AM  Radiology CT Head Wo Contrast  Result Date: 07/05/2021 CLINICAL DATA:  Neuro deficit, acute, stroke suspected. Additional history provided: Patient reports increased weakness for 2 weeks. EXAM: CT HEAD WITHOUT CONTRAST TECHNIQUE: Contiguous axial images were obtained from the base of the skull through the vertex without intravenous contrast. COMPARISON:  Brain MRI 09/04/2019. FINDINGS: Brain: Mild-to-moderate generalized cerebral atrophy. 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 Mild-to-moderate patchy and ill-defined hypoattenuation within the cerebral white matter, nonspecific but compatible chronic small vessel ischemic disease. There is no acute intracranial hemorrhage. No demarcated cortical  infarct. No extra-axial fluid collection. No evidence of an intracranial mass. No midline shift. Vascular: No hyperdense vessel.  Atherosclerotic calcifications Skull: Normal. Negative for fracture or focal lesion. Sinuses/Orbits: Visualized orbits show no acute finding. No significant paranasal sinus disease at the imaged levels. 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 Electronically Signed   By: Jackey Loge DO   On: 07/05/2021 09:38   DG Chest Portable 1 View  Result Date: 07/05/2021 CLINICAL DATA:  Weakness.  Shortness of breath. EXAM: PORTABLE CHEST 1 VIEW COMPARISON:  08/09/2019. FINDINGS: Mediastinum and hilar structures normal. Prior cardiac valve replacement. Mild cardiomegaly. No pulmonary venous congestion. Calcified pulmonary nodules again noted consistent with granulomas. No focal infiltrate. No pleural effusion or pneumothorax. IMPRESSION: Valve replacement. Mild cardiomegaly. No pulmonary venous congestion. No acute pulmonary disease. Electronically Signed   By: Maisie Fus  Register   On: 07/05/2021 09:24    Procedures .Critical Care  Date/Time: 07/05/2021 12:07 PM Performed by: Linwood Dibbles, MD Authorized by: Linwood Dibbles, MD   Critical care provider statement:    Critical care time (minutes):  45   Critical care was time spent personally by me on the following activities:  Discussions with consultants, evaluation of patient's response to treatment, examination of patient, ordering and performing treatments and interventions, ordering and review of laboratory studies, ordering and review of radiographic studies, pulse oximetry, re-evaluation of patient's condition, obtaining history from patient or surrogate and review of old charts   Medications Ordered  in ED Medications  potassium chloride SA (KLOR-CON) CR tablet 40 mEq (has no administration in time range)  pantoprazole (PROTONIX) injection 40 mg (has no administration in time range)  aspirin chewable tablet 324 mg (324 mg Oral Given  07/05/21 4765)    ED Course  I have reviewed the triage vital signs and the nursing notes.  Pertinent labs & imaging results that were available during my care of the patient were reviewed by me and considered in my medical decision making (see chart for details).  Clinical Course as of 07/06/21 4650  Tue Jul 05, 2021  1136 Labs notable for hypokalemia [JK]  1136 Anemia compared to previous values [JK]  1137 Chest x-ray without acute findings [JK]  1137 Head CT without acute findings [JK]  1137 Serial troponins are normal [JK]  1137 BNP and D-dimer normal [JK]  1207 Case discussed with Dr. Levon Hedger.  Recommends PPI as well as potassium repletion.  Those have been ordered.  Anticipates endoscopy tomorrow [JK]    Clinical Course User Index [JK] Linwood Dibbles, MD   MDM Rules/Calculators/A&P                          Patient presented to the ED with complaints of generalized fatigue and shortness of breath over the last couple weeks.  Patient also mentions some difficulty with her gait.  Unclear if this was related to generalized fatigue versus ataxia.  Head CT was performed.  No acute findings.  Patient's laboratory tests were notable for slight decrease in her hemoglobin as well as hypokalemia and elevated BUN.  No signs of pneumonia.  No signs of acute coronary syndrome, CHF or elevated D-dimer to suggest PE.  I discussed these findings with the patient and she did mention she had noticed some dark stools recently.  Hemoccult was performed and the patient does have evidence of guaiac positive stools and melena.  Patient appears to be having an upper GI bleed resulting in anemia.  Suspect this is the cause of her fatigue and dyspnea on exertion.  I will  consult with gastroenterology and the medical service for admission and further treatment.  IV Protonix ordered Final Clinical Impression(s) / ED Diagnoses Final diagnoses:  Upper GI bleed  Anemia, unspecified type  Hypokalemia       Linwood Dibbles, MD 07/06/21 [phone]

## 2021-07-05 NOTE — Consult Note (Signed)
Cardiology Consultation:   Patient ID: Kathleen Barnett MRN: [id number]; DOB: [birth year]  Admit date: 07/05/2021 Date of Consult: 07/05/2021  PCP:  Kathleen Pearson, FNP   [facility] Providers Cardiologist:  None   {  Patient Profile:   Kathleen Barnett is a 80 y.o. female with a hx of AFIB (on Coumadin), aortic valve replacement, DM, HL, and cognitive impairment, who is being seen 07/05/2021 for the evaluation of an abnormal ECG at the request of Kathleen Barnett, Kathleen Hua, MD.  History of Present Illness:   [name] presents with worsening shortness of breath. Symptoms had been accelerating over the past three weeks. Also had associated generalized weakness and dizziness. She reports melanotic stools for the past few weeks.   We do not have any details regarding her cardiac history.   In the ED, the vitals were: BP 131/82 mmHg, HR 80 bpm. Labs were: Na 134, K 2.9, gluc 132, BUN 43, Cr 1.0, WBC 7.4, hgb 10.8, hct 31.9, Plt 213. INR: > 10 BNP 40 Troponins 11 and 11 Fecal occult blood: positive CXR: aortic valve replacement. No pulmonary venous congestion. No acute pulmonary disease. CT head: no acute pathology - mild-to-moderate generalized cerebral atrophy and cerebral white matter chronic small vessel ischemic disease.  The ECG from 07/05/2021 (at 08:17), that I reviewed personally, showed sinus tachycardia (rate 102 bpm) with frequent PACs. The auto-interpretation states "second degree AV block, Mobitz II" which is wrong. There is left axis deviation and low voltage QRS in the precordial leads. QTc is  463 ms   Past Medical History:  Diagnosis Date   Diabetes mellitus without complication [facility])     Past Surgical History:  Procedure Laterality Date   ABDOMINAL HYSTERECTOMY     APPENDECTOMY     CATARACT EXTRACTION W/PHACO Right 12/29/2019   Procedure: CATARACT EXTRACTION PHACO AND INTRAOCULAR LENS PLACEMENT RIGHT EYE;  Surgeon: Kathleen Pierce, MD;  Location: [facility];  Service: Ophthalmology;   Laterality: Right;  CDE: 18.10   CATARACT EXTRACTION W/PHACO Left 01/26/2020   Procedure: CATARACT EXTRACTION PHACO AND INTRAOCULAR LENS PLACEMENT (IOC);  Surgeon: Kathleen Pierce, MD;  Location: [facility];  Service: Ophthalmology;  Laterality: Left;  CDE: 8.22   CHOLECYSTECTOMY       Home Medications:  Prior to Admission medications   Medication Sig Start Date End Date Taking? Authorizing Provider  albuterol (VENTOLIN HFA) 108 (90 Base) MCG/ACT inhaler Inhale 2 puffs into the lungs every 6 (six) hours as needed for wheezing or shortness of breath.    Yes [provider]  atorvastatin (LIPITOR) 20 MG tablet Take 20 mg by mouth every evening.    Yes [provider]  donepezil (ARICEPT) 10 MG tablet Take 10 mg by mouth daily.   Yes [provider]  ibuprofen (ADVIL) 200 MG tablet Take 400 mg by mouth daily as needed for moderate pain.   Yes [provider]  isosorbide mononitrate (IMDUR) 30 MG 24 hr tablet Take 30 mg by mouth daily.    Yes [provider]  memantine (NAMENDA) 10 MG tablet Take 10 mg by mouth 2 (two) times daily.   Yes [provider]  metFORMIN (GLUCOPHAGE) 500 MG tablet Take 500 mg by mouth 2 (two) times daily with a meal.   Yes [provider]  naproxen (NAPROSYN) 500 MG tablet Take 500 mg by mouth 2 (two) times daily.   Yes [provider]  omeprazole (PRILOSEC) 40 MG capsule Take 40 mg by mouth daily. 01/15/20  Yes [provider]  QUEtiapine (SEROQUEL) 25 MG tablet Take 25 mg by mouth at bedtime. 03/04/21  Yes [provider]  traMADol (ULTRAM) 50 MG tablet Take 50 mg by mouth daily as needed (pain).    Yes [provider]  traZODone (DESYREL) 150 MG tablet Take 300 mg by mouth at bedtime.    Yes [provider]  warfarin (COUMADIN) 5 MG tablet Take 7.5 mg by mouth daily.   Yes [provider]    Inpatient Medications: Scheduled Meds:  pantoprazole (PROTONIX) IV   40 mg Intravenous Q12H   Continuous Infusions:  potassium chloride     PRN Meds:   Allergies:   No Known Allergies  Social History:   Social History   Socioeconomic History   Marital status: Single    Spouse name: Not on file   Number of children: Not on file   Years of education: Not on file   Highest education level: Not on file  Occupational History   Not on file  Tobacco Use   Smoking status: Every Day    Packs/day: 0.50    Types: Cigarettes   Smokeless tobacco: Never  Vaping Use   Vaping Use: Never used  Substance and Sexual Activity   Alcohol use: Not Currently   Drug use: Never   Sexual activity: Not on file  Other Topics Concern   Not on file  Social History Narrative   Not on file   Social Determinants of Health   Financial Resource Strain: Not on file  Food Insecurity: Not on file  Transportation Needs: Not on file  Physical Activity: Not on file  Stress: Not on file  Social Connections: Not on file  Intimate Partner Violence: Not on file    Family History:    Family History  Problem Relation Age of Onset   Colon cancer Neg Hx      ROS:  Please see the history of present illness.   All other ROS reviewed and negative.     Physical Exam/Data:   Vitals:   07/05/21 1000 07/05/21 1030 07/05/21 1205 07/05/21 1335  BP: (!) 141/61 (!) 113/100 121/89   Pulse: 71 77 74 82  Resp: 18 16 16 14   Temp:      TempSrc:      SpO2: 98% 97% 97% 100%  Weight:      Height:       No intake or output data in the 24 hours ending 07/05/21 1419 Last 3 Weights 07/05/2021 08/09/2019  Weight (lbs) 200 lb 206 lb  Weight (kg) 90.719 kg 93.441 kg     Body mass index is 31.32 kg/m.  General:  Well nourished, well developed, in no acute distress HEENT: normal Lymph: no adenopathy Neck: no JVD Endocrine:  No thryomegaly Vascular: No carotid bruits; FA pulses 2+ bilaterally without bruits  Cardiac:  normal S1, S2; no murmurs  Lungs:  clear to auscultation  bilaterally, no wheezing, rhonchi or rales  Abd: soft, nontender, no hepatomegaly  Ext: no edema Musculoskeletal:  No deformities, BUE and BLE strength normal and equal Skin: warm and dry  Neuro:  CNs 2-12 intact, no focal abnormalities noted Psych:  Normal affect    Laboratory Data:  High Sensitivity Troponin:   Recent Labs  Lab 07/05/21 0855 07/05/21 1053  TROPONINIHS 11 11     Chemistry Recent Labs  Lab 07/05/21 0855  NA 134*  K 2.9*  CL 98  CO2 24  GLUCOSE 132*  BUN 43*  CREATININE 1.00  CALCIUM 8.3*  GFRNONAA 58*  ANIONGAP 12    No results for input(s): PROT, ALBUMIN, AST, ALT, ALKPHOS, BILITOT in the last 168 hours. Hematology Recent Labs  Lab 07/05/21 0855  WBC 7.4  RBC 3.73*  HGB 10.8*  HCT 31.9*  MCV 85.5  MCH 29.0  MCHC 33.9  RDW 13.5  PLT 213   BNP Recent Labs  Lab 07/05/21 0856  BNP 40.0    DDimer  Recent Labs  Lab 07/05/21 0855  DDIMER 0.31     Radiology/Studies:  CT Head Wo Contrast  Result Date: 07/05/2021 CLINICAL DATA:  Neuro deficit, acute, stroke suspected. Additional history provided: Patient reports increased weakness for 2 weeks. EXAM: CT HEAD WITHOUT CONTRAST TECHNIQUE: Contiguous axial images were obtained from the base of the skull through the vertex without intravenous contrast. COMPARISON:  Brain MRI 09/04/2019. FINDINGS: Brain: Mild-to-moderate generalized cerebral atrophy. 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 Mild-to-moderate patchy and ill-defined hypoattenuation within the cerebral white matter, nonspecific but compatible chronic small vessel ischemic disease. There is no acute intracranial hemorrhage. No demarcated cortical infarct. No extra-axial fluid collection. No evidence of an intracranial mass. No midline shift. Vascular: No hyperdense vessel.  Atherosclerotic calcifications Skull: Normal. Negative for fracture or focal lesion.  Sinuses/Orbits: Visualized orbits show no acute finding. No significant paranasal sinus disease at the imaged levels. 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 Electronically Signed   By: Jackey Loge DO   On: 07/05/2021 09:38   DG Chest Portable 1 View  Result Date: 07/05/2021 CLINICAL DATA:  Weakness.  Shortness of breath. EXAM: PORTABLE CHEST 1 VIEW COMPARISON:  08/09/2019. FINDINGS: Mediastinum and hilar structures normal. Prior cardiac valve replacement. Mild cardiomegaly. No pulmonary venous congestion. Calcified pulmonary nodules again noted consistent with granulomas. No focal infiltrate. No pleural effusion or pneumothorax. IMPRESSION: Valve replacement. Mild cardiomegaly. No pulmonary venous congestion. No acute pulmonary disease. Electronically Signed   By: Maisie Fus  Register   On: 07/05/2021 09:24     Assessment and Plan:   Abnormal ECG The patient is mildly tachycardiac. Rate is around 100 bpm. ECG shows sinus tachycardia with frequent PACs. The auto-interpretation states "second degree AV block, Mobitz II" which is wrong. There is left axis deviation and low voltage QRS in the precordial leads. QTc is  463 ms  No evidence of arrhythmias or long pauses.  -Continue to monitor on telemetry -Maintain serum K >4.0 and Mg >2.0 -Consider outpatient cardiac monitor if patient has recurrent symptoms even after GI bleeding is addressed.  2. Aortic valve replacement Do not have records of what was done. Appears to be a bio-prosthesis. Please obtain information from outside facility where her cardiac procedure was performed (? [place]).  -Can consider echo to assess valvular function and EF  3. Hx of atrial fibrillation  For chronic anticoagulation (to  continue therapy) weigh benefits versus risk of bleeding. Does not seem that she has great support at home and has declining cognitive function.   For questions or updates, please contact [facility] Please consult [email or web address] for contact info under    Signed, Lonie Peak, MD  07/05/2021 2:19 PM

## 2021-07-05 NOTE — ED Triage Notes (Signed)
Pt states her family made her come in today to be seen because she has been feeling weak and having periods of shortness of breath x2weeks.

## 2021-07-06 ENCOUNTER — Encounter (HOSPITAL_COMMUNITY): Payer: Self-pay | Admitting: Anesthesiology

## 2021-07-06 ENCOUNTER — Observation Stay (HOSPITAL_COMMUNITY): Payer: Medicare Other | Admitting: Certified Registered Nurse Anesthetist

## 2021-07-06 ENCOUNTER — Encounter (HOSPITAL_COMMUNITY)
Admission: EM | Disposition: A | Discharge status: Home Health | Payer: Self-pay | Source: Home / Self Care | Attending: Family Medicine

## 2021-07-06 DIAGNOSIS — D500 Iron deficiency anemia secondary to blood loss (chronic): Secondary | ICD-10-CM

## 2021-07-06 DIAGNOSIS — R531 Weakness: Secondary | ICD-10-CM | POA: Diagnosis present

## 2021-07-06 DIAGNOSIS — G8929 Other chronic pain: Secondary | ICD-10-CM | POA: Diagnosis present

## 2021-07-06 DIAGNOSIS — K449 Diaphragmatic hernia without obstruction or gangrene: Secondary | ICD-10-CM | POA: Diagnosis present

## 2021-07-06 DIAGNOSIS — E876 Hypokalemia: Secondary | ICD-10-CM | POA: Diagnosis present

## 2021-07-06 DIAGNOSIS — K922 Gastrointestinal hemorrhage, unspecified: Secondary | ICD-10-CM | POA: Diagnosis not present

## 2021-07-06 DIAGNOSIS — I4891 Unspecified atrial fibrillation: Secondary | ICD-10-CM | POA: Diagnosis present

## 2021-07-06 DIAGNOSIS — Z66 Do not resuscitate: Secondary | ICD-10-CM | POA: Diagnosis not present

## 2021-07-06 DIAGNOSIS — E119 Type 2 diabetes mellitus without complications: Secondary | ICD-10-CM | POA: Diagnosis present

## 2021-07-06 DIAGNOSIS — F17210 Nicotine dependence, cigarettes, uncomplicated: Secondary | ICD-10-CM | POA: Diagnosis present

## 2021-07-06 DIAGNOSIS — F0390 Unspecified dementia without behavioral disturbance: Secondary | ICD-10-CM | POA: Diagnosis present

## 2021-07-06 DIAGNOSIS — I441 Atrioventricular block, second degree: Secondary | ICD-10-CM | POA: Diagnosis present

## 2021-07-06 DIAGNOSIS — K921 Melena: Secondary | ICD-10-CM | POA: Diagnosis not present

## 2021-07-06 DIAGNOSIS — Z7901 Long term (current) use of anticoagulants: Secondary | ICD-10-CM | POA: Diagnosis not present

## 2021-07-06 DIAGNOSIS — E538 Deficiency of other specified B group vitamins: Secondary | ICD-10-CM | POA: Diagnosis present

## 2021-07-06 DIAGNOSIS — R195 Other fecal abnormalities: Secondary | ICD-10-CM | POA: Diagnosis not present

## 2021-07-06 DIAGNOSIS — Z7984 Long term (current) use of oral hypoglycemic drugs: Secondary | ICD-10-CM | POA: Diagnosis not present

## 2021-07-06 DIAGNOSIS — L209 Atopic dermatitis, unspecified: Secondary | ICD-10-CM | POA: Diagnosis not present

## 2021-07-06 DIAGNOSIS — D649 Anemia, unspecified: Secondary | ICD-10-CM | POA: Diagnosis not present

## 2021-07-06 DIAGNOSIS — E785 Hyperlipidemia, unspecified: Secondary | ICD-10-CM | POA: Diagnosis present

## 2021-07-06 DIAGNOSIS — K254 Chronic or unspecified gastric ulcer with hemorrhage: Secondary | ICD-10-CM | POA: Diagnosis present

## 2021-07-06 DIAGNOSIS — Z20822 Contact with and (suspected) exposure to covid-19: Secondary | ICD-10-CM | POA: Diagnosis present

## 2021-07-06 DIAGNOSIS — K2980 Duodenitis without bleeding: Secondary | ICD-10-CM | POA: Diagnosis not present

## 2021-07-06 DIAGNOSIS — K219 Gastro-esophageal reflux disease without esophagitis: Secondary | ICD-10-CM | POA: Diagnosis present

## 2021-07-06 DIAGNOSIS — Z952 Presence of prosthetic heart valve: Secondary | ICD-10-CM | POA: Diagnosis not present

## 2021-07-06 DIAGNOSIS — Z791 Long term (current) use of non-steroidal anti-inflammatories (NSAID): Secondary | ICD-10-CM | POA: Diagnosis not present

## 2021-07-06 DIAGNOSIS — I480 Paroxysmal atrial fibrillation: Secondary | ICD-10-CM

## 2021-07-06 DIAGNOSIS — Z79899 Other long term (current) drug therapy: Secondary | ICD-10-CM | POA: Diagnosis not present

## 2021-07-06 DIAGNOSIS — I491 Atrial premature depolarization: Secondary | ICD-10-CM | POA: Diagnosis not present

## 2021-07-06 DIAGNOSIS — K259 Gastric ulcer, unspecified as acute or chronic, without hemorrhage or perforation: Secondary | ICD-10-CM | POA: Diagnosis not present

## 2021-07-06 DIAGNOSIS — E871 Hypo-osmolality and hyponatremia: Secondary | ICD-10-CM | POA: Diagnosis present

## 2021-07-06 LAB — BPAM FFP
Blood Product Expiration Date: 202207242359
ISSUE DATE / TIME: 202207191846
Unit Type and Rh: 600

## 2021-07-06 LAB — CBC
HCT: 25.2 % — ABNORMAL LOW (ref 36.0–46.0)
Hemoglobin: 8.6 g/dL — ABNORMAL LOW (ref 12.0–15.0)
MCH: 29.4 pg (ref 26.0–34.0)
MCHC: 34.1 g/dL (ref 30.0–36.0)
MCV: 86 fL (ref 80.0–100.0)
Platelets: 176 10*3/uL (ref 150–400)
RBC: 2.93 MIL/uL — ABNORMAL LOW (ref 3.87–5.11)
RDW: 13.2 % (ref 11.5–15.5)
WBC: 6 10*3/uL (ref 4.0–10.5)
nRBC: 0 % (ref 0.0–0.2)

## 2021-07-06 LAB — GLUCOSE, CAPILLARY
Glucose-Capillary: 88 mg/dL (ref 70–99)
Glucose-Capillary: 94 mg/dL (ref 70–99)
Glucose-Capillary: 106 mg/dL — ABNORMAL HIGH (ref 70–99)
Glucose-Capillary: 166 mg/dL — ABNORMAL HIGH (ref 70–99)
Glucose-Capillary: 238 mg/dL — ABNORMAL HIGH (ref 70–99)

## 2021-07-06 LAB — BASIC METABOLIC PANEL
Anion gap: 7 (ref 5–15)
BUN: 27 mg/dL — ABNORMAL HIGH (ref 8–23)
CO2: 29 mmol/L (ref 22–32)
Calcium: 8.4 mg/dL — ABNORMAL LOW (ref 8.9–10.3)
Chloride: 102 mmol/L (ref 98–111)
Creatinine, Ser: 0.83 mg/dL (ref 0.44–1.00)
GFR, Estimated: >60 mL/min (ref >60)
Glucose, Bld: 83 mg/dL (ref 70–99)
Potassium: 3.4 mmol/L — ABNORMAL LOW (ref 3.5–5.1)
Sodium: 138 mmol/L (ref 135–145)

## 2021-07-06 LAB — PREPARE FRESH FROZEN PLASMA: Unit division: 0

## 2021-07-06 LAB — PROTIME-INR
INR: 1.5 — ABNORMAL HIGH (ref 0.8–1.2)
Prothrombin Time: 18.5 seconds — ABNORMAL HIGH (ref 11.4–15.2)

## 2021-07-06 LAB — MAGNESIUM: Magnesium: 1.3 mg/dL — ABNORMAL LOW (ref 1.7–2.4)

## 2021-07-06 SURGERY — ESOPHAGOGASTRODUODENOSCOPY (EGD) WITH PROPOFOL
Anesthesia: Choice

## 2021-07-06 MED ORDER — CYANOCOBALAMIN 1000 MCG/ML IJ SOLN
1000.0000 ug | Freq: Once | INTRAMUSCULAR | Status: AC
  Administered 2021-07-06: 1000 ug via INTRAMUSCULAR
  Filled 2021-07-06: qty 1

## 2021-07-06 MED ORDER — ALBUTEROL SULFATE (2.5 MG/3ML) 0.083% IN NEBU: 3.0000 mL | INHALATION_SOLUTION | Freq: Four times a day (QID) | RESPIRATORY_TRACT | Status: DC | PRN

## 2021-07-06 MED ORDER — MAGNESIUM SULFATE 4 GM/100ML IV SOLN
4.0000 g | Freq: Once | INTRAVENOUS | Status: AC
  Administered 2021-07-06: 4 g via INTRAVENOUS
  Filled 2021-07-06: qty 100

## 2021-07-06 MED ORDER — SODIUM CHLORIDE 0.9 % IV SOLN
250.0000 mg | Freq: Every day | INTRAVENOUS | Status: AC
  Administered 2021-07-06 – 2021-07-07 (×2): 250 mg via INTRAVENOUS
  Filled 2021-07-06 (×2): qty 20

## 2021-07-06 MED ORDER — POTASSIUM CHLORIDE 10 MEQ/100ML IV SOLN
INTRAVENOUS | Status: AC
  Administered 2021-07-06: 10 meq via INTRAVENOUS
  Filled 2021-07-06: qty 200

## 2021-07-06 MED ORDER — POTASSIUM CHLORIDE 10 MEQ/100ML IV SOLN
10.0000 meq | INTRAVENOUS | Status: AC
Start: 2021-07-06 — End: 2021-07-06
  Administered 2021-07-06 (×2): 10 meq via INTRAVENOUS
  Filled 2021-07-06: qty 100

## 2021-07-06 MED ORDER — TRAZODONE HCL 50 MG PO TABS
150.0000 mg | ORAL_TABLET | Freq: Every day | ORAL | Status: DC
  Administered 2021-07-06 – 2021-07-08 (×3): 150 mg via ORAL
  Filled 2021-07-06 (×3): qty 3

## 2021-07-06 NOTE — Progress Notes (Signed)
In to disconnect IV for transport to Endo. Pt drinking beef broth and has eaten 2 cups of jello. Endo staff and MD notified that pt has not been NPO, order received to postpone case till tomorrow. Pt's primary nurse Lawanna Kobus, RN notified of same.

## 2021-07-06 NOTE — Progress Notes (Addendum)
PROGRESS NOTE    Kathleen HerringJanice E Vanbenschoten  ZOX:[id number] DOB: 11/26/1941 DOA: 07/05/2021 PCP: Marylynn PearsonMcCorkle, Tenika, FNP , [facility]    Brief Narrative:   Kathleen Barnett is a 80 y.o. 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  Patient has had some near syncopal episodes of intermittently feeling dizzy.  She has had some intermittent chest discomfort with exertion lasting about 5 to 10 minutes.  She states that it improves when she rests.  She denies any worsening peripheral edema, orthopnea, PND.  She denies any fevers, chills, headache, neck pain, coughing, hemoptysis.  The patient is unable to find any further history regarding her heart valve.  She denies any abdominal pain, but states that she has been having some occasional melanotic stool over the past 2 to 3 weeks.  She denies any frank hematochezia, dysuria, hematuria.  She denies any visual disturbance or focal extremity weakness. In the emergency department, the patient was afebrile and hemodynamically stable with oxygen saturation 98% on room air.  BMP showed a sodium of 134, potassium 2.9, serum creatinine 1.00.  WBC 7.4, hemoglobin 10.8, platelets 213,000.  D-dimer was 0.31.  BNP was 40.  INR >10. Troponin 11>>11.  Chest x-ray showed mild cardiomegaly without any infiltrates or edema.  FOBT was positive.  CT of the brain was negative for acute findings.  GI was consulted to assist with management.  Cardiology was consulted also for abnormal EKG.  Vitamin K and FFP were given with improvement in INR. In speaking w daughter Efraim KaufmannMelissa, [facility]. Home meds include 7.5mg  Coumadin every day, naproxen 500mg  BID for many years. Daughter is advised to not continue  NSAIDs.   7/20 - Patient is doing okay; reports 1 episode of dark stool over night. INR has trended down to 1.5. Cardiology Dr. Diona BrownerMcDowell evaluated patient and recommends echocardiology to evaluate for atrial valve replacement. Further attempts at anticoagulation will depend on GI status and review of risk/benefit. GI (Dr. Levon Hedgerastaneda) saw patient, thought patient to have upper GIB of unclear source. Considering endoscopic evaluation. Hold NSAIDS, continue PPI twice daily IV.   CODE STATUS IS DO NOT RESUSCITATE  Assessment & Plan:   Active Problems:   Symptomatic anemia   Melena   Heme positive stool   Chronic anticoagulation   Hypokalemia   Unspecified atrial fibrillation (HCC)   Iron deficiency anemia due to chronic blood loss   B12 deficiency  Melena/symptomatic anemia/FOBT+ -Patient presented with hemoglobin 10.8 > 8.6 -Baseline hemoglobin 12-13 -GI consulted -Continue PPI bid -Consider EGD once bleeding is stable -Clear liquid diet for now -Anemia panel consistent with IDA with ferritin 5 - Pharmacy consulted for recommendations on warfarin dosing.   Coagulopathy -INR>10 > 1.5 this am -give vitamin K 5 mg IV -FFP x 1 unit  Iron-deficiency anemia - likely from poor nutrition as patient has not been eating much in the past 2 weeks (per daughter). Patient has exhibited some weakness  - Hgb 10.8 > 8.6 in setting of subtherapeutic INR - Anemia panel showed ferritin 5, iron 47 - Will start IV iron infusion - PT to evaluate and treat   Cardiac dysrhythmia/Atrial fibrillation, type unspecifie -Cardiology consult--Dr. Deforest HoylesAkhter reviewed EKG - EKG shows sinus tachycardia (rate 102 bpm) with frequent PACs.  There is left axis deviation and low voltage QRS in precordial leads. Qtc 463 ms.  - Dr. Diona Browner saw patient this AM, reviewed telemetry, documenting that it shows sinus rhythm with frequent PACs and bursts of PAT/AF with no evidence of high degree heart block.  -Optimize  electrolytes -Remain on telemetry -no pauses noted thus far  Chest discomfort -troponins 11>>11 -Echocardiogram to define AVR    Diabetes mellitus type 2 -Holding metformin -NovoLog sliding scale - A1c 6.5    History of heart valve replacement -f/u on daughter to bring in card and DNR to clarify details   Cognitive impairment/dementia -Continue Aricept and Namenda -Continue Seroquel at bedtime   Hyperlipidemia -Continue statin   Hypokalemia/ hypomagnesemia -replete  Left Breast Rash -chaperone present with Abigail, NT -appears to have small patch of atopic dermatitis under left breast--no signs of infection -prn topical hydrocortisone  DVT prophylaxis: SCD  Code Status: DNR  Family Communication: daughter Sonia Baller over phone Disposition Plan: PT to evaluate, likely [facility] with [facility]  Consultants:  Cardiology (Dr. Diona Browner) Gastroenterology (Dr. Levon Hedger)  Procedures:  None  Antimicrobials:  Antibiotics Given (last 72 hours)     None      Subjective: Patient was awake, evaluated at bedside. Patient reports to this writer that she did not have a bowel movement yesterday. States that she has not been to the doctors for a while. Per chart review, patient does have dementia and may not give reliable history. No acute complaint of abdominal pain, fever, chills, diarrhea, palpitations, chest pain.   Objective: Vitals:   07/05/21 1957 07/05/21 2220 07/05/21 2359 07/06/21 0410  BP:  133/68 107/79 (!) 127/94  Pulse:  74 75 91  Resp:  18 19 18   Temp:  (!) 97.5 F (36.4 C) 98.3 F (36.8 C) 98 F (36.7 C)  TempSrc:  Oral Oral Oral  SpO2: 95% 100% 96% 98%  Weight:      Height:        Intake/Output Summary (Last 24 hours) at 07/06/2021 1044 Last data filed at 07/06/2021 0900 Gross per 24 hour  Intake 642 ml  Output 150 ml  Net 492 ml   Filed Weights   07/05/21 0809 07/05/21 1513  Weight: 90.7 kg 82 kg    Examination:  General exam: Appears calm and  in no acute distress. Respiratory system: Clear to auscultation bilaterally. No labored breathing. Cardiovascular system: RRR, no rub, no gallop, no S3 Gastrointestinal system:  soft/NT, +BS, nondistended, no peritoneal signs Extremities: No cyanosis, No rashes, No petechiae, No lymphangitis, No edema Neuro: CNII-XII grossly intact. Awake and alert.  Psychiatry: Mood and affect in full range.   Data Reviewed: I have personally reviewed following labs and imaging studies  CBC: Recent Labs  Lab 07/05/21 0855 07/06/21 0402  WBC 7.4 6.0  HGB 10.8* 8.6*  HCT 31.9* 25.2*  MCV 85.5 86.0  PLT 213 176   Basic Metabolic Panel: Recent Labs  Lab 07/05/21 0855 07/06/21 0402  NA 134* 138  K 2.9* 3.4*  CL 98 102  CO2 24 29  GLUCOSE 132* 83  BUN 43* 27*  CREATININE 1.00 0.83  CALCIUM 8.3* 8.4*  MG  --  1.3*   GFR: Estimated Creatinine Clearance: 61.6 mL/min (by C-G formula based on SCr of 0.83 mg/dL). Liver Function Tests: No results for input(s): AST, ALT, ALKPHOS, BILITOT, PROT, ALBUMIN in the last 168 hours. No results for input(s): LIPASE, AMYLASE in the last 168 hours. No results for input(s): AMMONIA in  the last 168 hours. Coagulation Profile: Recent Labs  Lab 07/05/21 1404 07/06/21 0402  INR >10.0* 1.5*   Cardiac Enzymes: No results for input(s): CKTOTAL, CKMB, CKMBINDEX, TROPONINI in the last 168 hours. BNP (last 3 results) No results for input(s): PROBNP in the last 8760 hours. HbA1C: Recent Labs    07/05/21 0855  HGBA1C 6.5*   CBG: Recent Labs  Lab 07/05/21 0924 07/05/21 1702 07/05/21 2117 07/06/21 0710  GLUCAP 116* 108* 104* 88   Lipid Profile: No results for input(s): CHOL, HDL, LDLCALC, TRIG, CHOLHDL, LDLDIRECT in the last 72 hours. Thyroid Function Tests: No results for input(s): TSH, T4TOTAL, FREET4, T3FREE, THYROIDAB in the last 72 hours. Anemia Panel: Recent Labs    07/05/21 0855 07/05/21 1405  VITAMINB12  --  169*  FOLATE  --  9.8   FERRITIN  --  5*  TIBC  --  353  IRON  --  47  RETICCTPCT 1.9  --    Urine analysis:    Component Value Date/Time   COLORURINE AMBER (A) 08/09/2019 1838   APPEARANCEUR HAZY (A) 08/09/2019 1838   LABSPEC 1.036 (H) 08/09/2019 1838   PHURINE 5.0 08/09/2019 1838   GLUCOSEU NEGATIVE 08/09/2019 1838   HGBUR NEGATIVE 08/09/2019 1838   BILIRUBINUR SMALL (A) 08/09/2019 1838   KETONESUR 5 (A) 08/09/2019 1838   PROTEINUR 30 (A) 08/09/2019 1838   NITRITE NEGATIVE 08/09/2019 1838   LEUKOCYTESUR MODERATE (A) 08/09/2019 1838   Recent Results (from the past 240 hour(s))  Resp Panel by RT-PCR (Flu A&B, Covid) Nasopharyngeal Swab     Status: None   Collection Time: 07/05/21  8:55 AM   Specimen: Nasopharyngeal Swab; Nasopharyngeal(NP) swabs in vial transport medium  Result Value Ref Range Status   SARS Coronavirus 2 by RT PCR NEGATIVE NEGATIVE Final    Comment: (NOTE) SARS-CoV-2 target nucleic acids are NOT DETECTED.  The SARS-CoV-2 RNA is generally detectable in upper respiratory specimens during the acute phase of infection. The lowest concentration of SARS-CoV-2 viral copies this assay can detect is 138 copies/mL. A negative result does not preclude SARS-Cov-2 infection and should not be used as the sole basis for treatment or other patient management decisions. A negative result may occur with  improper specimen collection/handling, submission of specimen other than nasopharyngeal swab, presence of viral mutation(s) within the areas targeted by this assay, and inadequate number of viral copies(<138 copies/mL). A negative result must be combined with clinical observations, patient history, and epidemiological information. The expected result is Negative.  Fact Sheet for Patients:  BloggerCourse.com  Fact Sheet for Healthcare Providers:  SeriousBroker.it  This test is no t yet approved or cleared by the [place] FDA and  has been  authorized for detection and/or diagnosis of SARS-CoV-2 by FDA under an Emergency Use Authorization (EUA). This EUA will remain  in effect (meaning this test can be used) for the duration of the COVID-19 declaration under Section 564(b)(1) of the Act, 21 U.S.C.section 360bbb-3(b)(1), unless the authorization is terminated  or revoked sooner.       Influenza A by PCR NEGATIVE NEGATIVE Final   Influenza B by PCR NEGATIVE NEGATIVE Final    Comment: (NOTE) The Xpert Xpress SARS-CoV-2/FLU/RSV plus assay is intended as an aid in the diagnosis of influenza from Nasopharyngeal swab specimens and should not be used as a sole basis for treatment. Nasal washings and aspirates are unacceptable for Xpert Xpress SARS-CoV-2/FLU/RSV testing.  Fact Sheet for Patients: BloggerCourse.com  Fact Sheet for Healthcare Providers: SeriousBroker.it  This test is not yet approved or cleared by the [place] and has been authorized for detection and/or diagnosis of SARS-CoV-2 by FDA under an Emergency Use Authorization (EUA). This EUA will remain in effect (meaning this test can be used) for the duration of the COVID-19 declaration under Section 564(b)(1) of the Act, 21 U.S.C. section 360bbb-3(b)(1), unless the authorization is terminated or revoked.  Performed at [facility], [address], [place], [place] [address]     Radiology Studies: CT Head Wo Contrast  Result Date: 07/05/2021 CLINICAL DATA:  Neuro deficit, acute, stroke suspected. Additional history provided: Patient reports increased weakness for 2 weeks. EXAM: CT HEAD WITHOUT CONTRAST TECHNIQUE: Contiguous axial images were obtained from the base of the skull through the vertex without intravenous contrast. COMPARISON:  Brain MRI 09/04/2019. FINDINGS: Brain: Mild-to-moderate generalized cerebral atrophy. Known chronic lacunar infarcts within the bilateral basal ganglia, better appreciated  on the brain MRI of 09/04/2019. Mild-to-moderate patchy and ill-defined hypoattenuation within the cerebral white matter, nonspecific but compatible chronic small vessel ischemic disease. There is no acute intracranial hemorrhage. No demarcated cortical infarct. No extra-axial fluid collection. No evidence of an intracranial mass. No midline shift. Vascular: No hyperdense vessel.  Atherosclerotic calcifications Skull: Normal. Negative for fracture or focal lesion. Sinuses/Orbits: Visualized orbits show no acute finding. No significant paranasal sinus disease at the imaged levels. 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 Electronically Signed   By: Jackey Loge DO   On: 07/05/2021 09:38   DG Chest Portable 1 View  Result Date: 07/05/2021 CLINICAL DATA:  Weakness.  Shortness of breath. EXAM: PORTABLE CHEST 1 VIEW COMPARISON:  08/09/2019. FINDINGS: Mediastinum and hilar structures normal. Prior cardiac valve replacement. Mild cardiomegaly. No pulmonary venous congestion. Calcified pulmonary nodules again noted consistent with granulomas. No focal infiltrate. No pleural effusion or pneumothorax. IMPRESSION: Valve replacement. Mild cardiomegaly. No pulmonary venous congestion. No acute pulmonary disease. Electronically Signed   By: Maisie Fus  Register   On: 07/05/2021 09:24    Scheduled Meds:  sodium chloride   Intravenous Once   atorvastatin  20 mg Oral QPM   donepezil  10 mg Oral Daily   feeding supplement  1 Container Oral TID BM   insulin aspart  0-9 Units Subcutaneous TID WC   isosorbide mononitrate  30 mg Oral Daily   memantine  10 mg Oral BID   pantoprazole (PROTONIX) IV  40 mg Intravenous Q12H   QUEtiapine  25 mg Oral QHS   Continuous Infusions:  sodium  chloride 50 mL/hr at 07/05/21 1540   magnesium sulfate bolus IVPB 4 g (07/06/21 0952)   potassium chloride       LOS: 0 days    Time spent: 30 minutes  Elige Ko, Medical student [facility] Pager [phone]  If 7PM-7AM, please contact night-coverage [email or web address] Password [facility] 07/06/2021, 10:44 AM   ATTENDING NOTE  Patient seen and examined with Elige Ko, Medical student. In addition to supervising the encounter, I played a key role in the decision making process as well as reviewed key findings.  I reviewed recommendation from cardiology and GI team.  She is confused today. We reached out to family and they report that she is followed at the [facility] clinic locally for PT/INR testing.    Standley Dakins MD How to contact the [facility]  Attending or Consulting provider 7A - 7P or covering provider during after hours 7P -7A, for this patient?  Check the care team in [facility] and look for a) attending/consulting TR[facility] provider listed and b) the [facility] team listed Log into [email or web address] and use [place]'s universal password to access. If you do not have the password, please contact the hospital operator. Locate the [facility] provider you are looking for under [facility] and page to a number that you can be directly reached. If you still have difficulty reaching the provider, please page the [facility] (Director on Call) for the Hospitalists listed on amion for assistance.

## 2021-07-06 NOTE — Plan of Care (Signed)
  Problem: Acute Rehab PT Goals(only PT should resolve) Goal: Pt Will Perform Standing Balance Or Pre-Gait Flowsheets (Taken 07/06/2021 1429) Pt will perform standing balance or pre-gait: with Supervision Note: Will be able to march in place without UE assist to demonstrate improved standing balance Goal: Pt Will Ambulate Flowsheets (Taken 07/06/2021 1429) Pt will Ambulate:  75 feet  with supervision  with rolling walker   2:30 PM, 07/06/21 Tereasa Coop, DPT Physical Therapy with [facility]  [phone] office

## 2021-07-06 NOTE — Progress Notes (Signed)
Nutrition Brief Note  Patient identified on the Malnutrition Screening Tool (MST) Report  Presents with weakness, symptomatic anemia, heme positive stool, B-12 deficiency. Hx of diabetes. Patients daughter and son live with her.   Per GI - EGD planned for tomorrow and possible colonoscopy.   Wt Readings from Last 15 Encounters:  07/05/21 82 kg  08/09/19 93.4 kg   Patient denies recent weight changes. She used to weight around 200 lb but has gradually lost to current weight.   Body mass index is 28.31 kg/m. Patient meets criteria for overweight based on current BMI.   Current diet order is clear liquids, patient is consuming approximately 50-100% of meals at this time. At home she eats 1-2 meals daily and cooks at times for the family. Drinks primarily black coffee and water. No ONS at home.  Labs and medications reviewed.  BMP Latest Ref Rng & Units 07/06/2021 07/05/2021 12/29/2019  Glucose 70 - 99 mg/dL 83 892(J) 194(R)  BUN 8 - 23 mg/dL 74(Y) 81(K) 17  Creatinine 0.44 - 1.00 mg/dL 4.81 8.56 3.14  Sodium 135 - 145 mmol/L 138 134(L) 140  Potassium 3.5 - 5.1 mmol/L 3.4(L) 2.9(L) 3.6  Chloride 98 - 111 mmol/L 102 98 101  CO2 22 - 32 mmol/L 29 24 -  Calcium 8.9 - 10.3 mg/dL 9.7(W) 8.3(L) -     No nutrition interventions warranted at this time. If nutrition issues arise, please consult RD.   Royann Shivers MS,RD,CSG,LDN Contact: Loretha Stapler

## 2021-07-06 NOTE — Evaluation (Signed)
Physical Therapy Evaluation Patient Details Name: Kathleen Barnett MRN: [id number] DOB: [birth year] Today's Date: 07/06/2021   History of Present Illness  Kathleen Barnett is a 80 y.o. 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  Patient has had some near syncopal episodes of intermittently feeling dizzy.  She has had some intermittent chest discomfort with exertion lasting about 5 to 10 minutes.  She states that it improves when she rests.  She denies any worsening peripheral edema, orthopnea, PND.  She denies any fevers, chills, headache, neck pain, coughing, hemoptysis.  The patient is unable to find any further history regarding her heart valve.  She denies any abdominal pain, but states that she has been having some occasional melanotic stool over the past 2 to 3 weeks.  She denies any frank hematochezia, dysuria, hematuria.  She denies any visual disturbance or focal extremity weakness.   Clinical Impression    Patient sitting up in bed and agreeable to therapy. Alert and oriented x1 but patient able to follow commands. Able to perform all bed mobilities and transfers with modified independence. When patient asked to march in place while standing patient required upper extremity assist. Per patient she does not walk with assistive device at baseline but patient required RW for safety during today's session. Patient easily fatigue but able to ambulate with RW 30 feet with supervision.  Patient will continue to benefit from skilled physical therapy in hospital and recommended venue below to increase strength, balance, endurance for safe ADLs and gait.   Follow Up Recommendations Home health PT;Supervision - Intermittent    Equipment Recommendations  Standard walker     Recommendations for Other Services       Precautions / Restrictions Precautions Precautions: None      Mobility  Bed Mobility Overal bed mobility: Modified Independent             General bed mobility comments: able to transfer <-> supine to sit without physical assist    Transfers Overall transfer level: Modified independent               General transfer comment: able to transfer sit to stand without assist, but could not march in place without UE assist  Ambulation/Gait Ambulation/Gait assistance: Supervision Gait Distance (Feet): 30 Feet Assistive device: Rolling walker (2 wheeled) Gait Pattern/deviations: Trunk flexed Gait velocity: decreased   General Gait Details: decreased gait speed, slow labored movements, fatigues quickly  Stairs            Wheelchair Mobility    Modified Rankin (Stroke Patients Only)       Balance Overall balance assessment: Needs assistance   Sitting balance-Leahy Scale: Good     Standing balance support: Bilateral upper extremity supported Standing balance-Leahy Scale: Fair Standing balance comment: needs support for marching and weight shifting                             Pertinent Vitals/Pain Pain Assessment: 0-10 Pain Score:  (20/10) Pain Location: IV site Pain Descriptors / Indicators: Aching Pain Intervention(s): Repositioned    Home Living Family/patient expects to be discharged to:: Private residence Living Arrangements: Children Available Help at Discharge: Family Type of Home: House Home Access: Stairs to enter   Entrance  Stairs-Number of Steps: 3 Home Layout: Two level;Able to live on main level with bedroom/bathroom Home Equipment: Grab bars - tub/shower;Shower seat Additional Comments: has a walking stick but no cane. per patient she is able to help a little bit with house work but her family does most of the chores    Prior Function Level of Independence: Independent    Gait / Transfers Assistance Needed: per patient - who is a poor historian           Hand Dominance        Extremity/Trunk Assessment        Lower Extremity Assessment Lower Extremity Assessment: Generalized weakness       Communication   Communication: No difficulties  Cognition Arousal/Alertness: Awake/alert Behavior During Therapy: WFL for tasks assessed/performed Overall Cognitive Status: History of cognitive impairments - at baseline                                 General Comments: alert and oriented x1 (knew who she was but not time/place/situation)      General Comments      Exercises General Exercises - Lower Extremity Long Arc Quad: AROM;Both;10 reps;Seated Hip Flexion/Marching: AROM;Strengthening;Both;10 reps;Seated;Standing;Other (comment) (UE assist with standing) Toe Raises: AROM;Both;10 reps;Seated   Assessment/Plan    PT Assessment Patient needs continued PT services  PT Problem List Decreased strength;Decreased activity tolerance;Decreased mobility;Decreased balance       PT Treatment Interventions DME instruction;Gait training;Therapeutic activities;Therapeutic exercise;Patient/family education;Stair training;Balance training;Neuromuscular re-education    PT Goals (Current goals can be found in the Care Plan section)  Acute Rehab PT Goals Patient Stated Goal: to go home PT Goal Formulation: With patient Time For Goal Achievement: 07/20/21 Potential to Achieve Goals: Good    Frequency Min 3X/week   Barriers to discharge   none at this time    Co-evaluation               AM-PAC PT "6 Clicks" Mobility  Outcome Measure Help needed turning from your back to your side while in a flat bed without using bedrails?: None Help needed moving from lying on your back to sitting on the side of a flat bed without using bedrails?: None Help needed moving to and from a bed to a chair (including a wheelchair)?: A Little Help  needed standing up from a chair using your arms (e.g., wheelchair or bedside chair)?: None Help needed to walk in hospital room?: A Little Help needed climbing 3-5 steps with a railing? : A Little 6 Click Score: 21    End of Session Equipment Utilized During Treatment: Gait belt Activity Tolerance: No increased pain;Patient limited by fatigue;Patient limited by lethargy Patient left: in bed;with call bell/phone within reach Nurse Communication: Mobility status PT Visit Diagnosis: Unsteadiness on feet (R26.81);Muscle weakness (generalized) (M62.81);Difficulty in walking, not elsewhere classified (R26.2)    Time: 2353-6144 PT Time Calculation (min) (ACUTE ONLY): 22 min   Charges:   PT Evaluation $PT Eval Low Complexity: 1 Low PT Treatments $Therapeutic Exercise: 8-22 mins       2:29 PM, 07/06/21 Tereasa Coop, DPT Physical Therapy with [facility]  [phone] office

## 2021-07-06 NOTE — Progress Notes (Signed)
Subjective:  Feels ok. States she had another black stool overnight. Denies abdominal pain. No n/v. Tolerated clear liquid diet.   Objective: Vital signs in last 24 hours: Temp:  [97.5 F (36.4 C)-98.4 F (36.9 C)] 98 F (36.7 C) (07/20 0410) Pulse Rate:  [71-104] 91 (07/20 0410) Resp:  [12-19] 18 (07/20 0410) BP: (102-141)/(61-100) 127/94 (07/20 0410) SpO2:  [95 %-100 %] 98 % (07/20 0410) Weight:  [82 kg] 82 kg (07/19 1513) Last BM Date: 07/05/21 General:   Alert,  Well-developed, well-nourished, pleasant and cooperative in NAD. Doing word searches this morning.  Head:  Normocephalic and atraumatic. Eyes:  Sclera clear, no icterus.  Abdomen:  Soft, nontender and nondistended.  Normal bowel sounds, without guarding, and without rebound.   Extremities:  Without clubbing, deformity or edema. Neurologic:  Alert and  oriented x4;  grossly normal neurologically. Skin:  Intact without significant lesions or rashes. Psych:  Alert and cooperative. Normal mood and affect.  Intake/Output from previous day: 07/19 0701 - 07/20 0700 In: 402 [Blood:402] Out: 150 [Urine:150] Intake/Output this shift: No intake/output data recorded.  Lab Results: CBC Recent Labs    07/05/21 0855 07/06/21 0402  WBC 7.4 6.0  HGB 10.8* 8.6*  HCT 31.9* 25.2*  MCV 85.5 86.0  PLT 213 176   BMET Recent Labs    07/05/21 0855 07/06/21 0402  NA 134* 138  K 2.9* 3.4*  CL 98 102  CO2 24 29  GLUCOSE 132* 83  BUN 43* 27*  CREATININE 1.00 0.83  CALCIUM 8.3* 8.4*   LFTs No results for input(s): BILITOT, BILIDIR, IBILI, ALKPHOS, AST, ALT, PROT, ALBUMIN in the last 72 hours. No results for input(s): LIPASE in the last 72 hours. PT/INR Recent Labs    07/05/21 1404 07/06/21 0402  LABPROT >90.0* 18.5*  INR >10.0* 1.5*      Imaging Studies: CT Head Wo Contrast  Result Date: 07/05/2021 CLINICAL DATA:  Neuro deficit, acute, stroke suspected. Additional history provided: Patient reports increased  weakness for 2 weeks. EXAM: CT HEAD WITHOUT CONTRAST TECHNIQUE: Contiguous axial images were obtained from the base of the skull through the vertex without intravenous contrast. COMPARISON:  Brain MRI 09/04/2019. FINDINGS: Brain: Mild-to-moderate generalized cerebral atrophy. 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 Mild-to-moderate patchy and ill-defined hypoattenuation within the cerebral white matter, nonspecific but compatible chronic small vessel ischemic disease. There is no acute intracranial hemorrhage. No demarcated cortical infarct. No extra-axial fluid collection. No evidence of an intracranial mass. No midline shift. Vascular: No hyperdense vessel.  Atherosclerotic calcifications Skull: Normal. Negative for fracture or focal lesion. Sinuses/Orbits: Visualized orbits show no acute finding. No significant paranasal sinus disease at the imaged levels. 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 Electronically Signed   By: Jackey Loge DO   On: 07/05/2021 09:38   DG Chest Portable 1 View  Result Date: 07/05/2021 CLINICAL DATA:  Weakness.  Shortness of breath. EXAM: PORTABLE CHEST 1 VIEW COMPARISON:  08/09/2019. FINDINGS: Mediastinum and hilar structures normal. Prior cardiac valve replacement. Mild cardiomegaly. No pulmonary venous congestion. Calcified pulmonary nodules again noted consistent with granulomas. No focal infiltrate. No pleural effusion or pneumothorax. IMPRESSION: Valve replacement. Mild cardiomegaly. No pulmonary venous congestion. No acute pulmonary disease. 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   Symptomatic anemia: Hemoglobin 10.8 on admission, down from 12.9, 1 year ago. Today Hgb 8.6. Stool heme positive.  Patient reports melanotic stools for the last 3 to 4 days. Stool black in the ED (in setting of INR >10). Patient had another black stool overnight. Per patient's daughter, patient takes naproxen twice daily for chronic pain.  No other NSAIDs.  Also on omeprazole 40 mg every other day for chronic history of reflux.  Denies history of alcohol or illicit drug use.  She does smoke cigarettes. Concern for UGI bleeding in setting of NSAIDS and warfarin. EGD delayed yesterday due to INR >10 (received one unit FFP and vit K 5mg  IV yesterday. INR 1.5 today. Lost IV access earlier this morning but successful insertion of peripheral IV 22G  Of note, anemia labs c/w IDA with ferritin of 5, B12 also low.    Plan: Monitor for further bleeding. Follow H/H. Consider EGD given further melena, IDA, and use of NSAIDs. To discuss with Dr. . Cardiology awaiting GI status to make decision regarding ongoing anticoagulation.  Continue clear liquids for now.  Management of low B12 per attending.  May benefit from iron infusion.  Avoid NSAIDS.  Continue PPI BID.   Karilyn Cota. Leanna Battles [facility] [phone] 7/20/202210:31 AM    LOS: 0 days

## 2021-07-06 NOTE — Progress Notes (Signed)
   Progress Note  Patient Name: Kathleen Barnett Date of Encounter: 07/06/2021  Primary Cardiologist: New to [facility]  Cardiology consultation note from yesterday reviewed.  Telemetry shows sinus rhythm with frequent PACs and bursts of PAT/AF.  No evidence of high degree heart block.  Patient afebrile, systolic blood pressure 102-133 with heart rate generally in the 70s to 90s.  Pertinent lab work includes potassium 3.4, BUN 27, creatinine 0.87, hemoglobin 8.6, platelets 176, INR down to 1.5 from greater than 10, fecal occult positive.  Electrolytes are being repleted, she received FFP and Coumadin remains on hold.  GI consultation reviewed.  Could consider echocardiogram to define AVR.  In terms of further attempts at anticoagulation, this will be guided by GI status and further review of risk/benefit ratio.  May be a better candidate for DOAC if plan is to continue.  Signed, Nona Dell, MD  07/06/2021, 8:57 AM

## 2021-07-06 NOTE — Progress Notes (Signed)
ANTICOAGULATION CONSULT NOTE - Initial Consult  Pharmacy Consult for warfarin Indication: atrial fibrillation  No Known Allergies  Patient Measurements: Height: 5\' 7"  (170.2 cm) Weight: 82 kg (180 lb 12.4 oz) IBW/kg (Calculated) : 61.6 Heparin Dosing Weight:   Vital Signs: Temp: 98 F (36.7 C) (07/20 0410) Temp Source: Oral (07/20 0410) BP: 127/94 (07/20 0410) Pulse Rate: 91 (07/20 0410)  Labs: Recent Labs    07/05/21 0855 07/05/21 1053 07/05/21 1404 07/06/21 0402  HGB 10.8*  --   --  8.6*  HCT 31.9*  --   --  25.2*  PLT 213  --   --  176  LABPROT  --   --  >90.0* 18.5*  INR  --   --  >10.0* 1.5*  CREATININE 1.00  --   --  0.83  TROPONINIHS 11 11  --   --     Estimated Creatinine Clearance: 61.6 mL/min (by C-G formula based on SCr of 0.83 mg/dL).   Medical History: Past Medical History:  Diagnosis Date   Diabetes mellitus without complication (HCC)     Medications:  Medications Prior to Admission  Medication Sig Dispense Refill Last Dose   albuterol (VENTOLIN HFA) 108 (90 Base) MCG/ACT inhaler Inhale 2 puffs into the lungs every 6 (six) hours as needed for wheezing or shortness of breath.    PRN   atorvastatin (LIPITOR) 20 MG tablet Take 20 mg by mouth every evening.    07/04/2021   donepezil (ARICEPT) 10 MG tablet Take 10 mg by mouth daily.   07/05/2021   ibuprofen (ADVIL) 200 MG tablet Take 400 mg by mouth daily as needed for moderate pain.   PRN   isosorbide mononitrate (IMDUR) 30 MG 24 hr tablet Take 30 mg by mouth daily.    07/05/2021   memantine (NAMENDA) 10 MG tablet Take 10 mg by mouth 2 (two) times daily.   07/05/2021   metFORMIN (GLUCOPHAGE) 500 MG tablet Take 500 mg by mouth 2 (two) times daily with a meal.   07/05/2021   naproxen (NAPROSYN) 500 MG tablet Take 500 mg by mouth 2 (two) times daily.   07/05/2021   omeprazole (PRILOSEC) 40 MG capsule Take 40 mg by mouth daily.   07/05/2021   QUEtiapine (SEROQUEL) 25 MG tablet Take 25 mg by mouth at bedtime.    Past Week   traMADol (ULTRAM) 50 MG tablet Take 50 mg by mouth daily as needed (pain).    Past Week   traZODone (DESYREL) 150 MG tablet Take 300 mg by mouth at bedtime.    07/04/2021   warfarin (COUMADIN) 5 MG tablet Take 7.5 mg by mouth daily.   07/05/2021 at 0630    Assessment: Pharmacy consulted to dose warfarin in patient with atrial fibrillation.  Patient's INR on admission > 10 and received 5 mg IV Vitamin K.  INR today is 1.5. Home dose listed as 7.5 mg daily and last dose 7/10 0630.  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 FOBT +   Goal of Therapy:  INR 2-3 Monitor platelets by anticoagulation protocol: Yes   Plan:  Hold warfarin today. Follow-up GI and cards recommendations on restart.  07/07/2021, PharmD Clinical Pharmacist 07/06/2021 1:06 PM

## 2021-07-06 NOTE — TOC Initial Note (Signed)
Transition of Care [facility]) - Initial/Assessment Note    Patient Details  Name: Kathleen Barnett MRN: [id number] Date of Birth: 04-29-1941  Transition of Care [facility] CM/SW Contact:    Annice Needy, LCSW Phone Number: 07/06/2021, 4:27 PM  Clinical Narrative:                 Patient recommended for HHPT and standard walker. Patient is agreeable to services. [facility] accepting of patient's insurance, referral made and accepted for HHRN/PT. Patient agreeable to standard walker. DME providers reviewed. Referral made to [facility] for standard walker.   Expected Discharge Plan: Home w Home Health Services Barriers to Discharge: Continued Medical Work up   Patient Goals and CMS Choice Patient states their goals for this hospitalization and ongoing recovery are:: home with [facility]      Expected Discharge Plan and Services Expected Discharge Plan: Home w Home Health Services       Living arrangements for the past 2 months: Single Family Home                 DME Arranged: Dan Humphreys DME Agency: [facility] Date DME Agency Contacted: 07/06/21 Time DME Agency Contacted: [phone] Representative spoke with at DME Agency: Velna Hatchet HH Arranged: RN, PT [facility] Agency: [facility] Date HH Agency Contacted: 07/06/21 Time HH Agency Contacted: 1626 Representative spoke with at [facility] Agency: Bonita Quin  Prior Living Arrangements/Services Living arrangements for the past 2 months: Single Family Home                     Activities of Daily Living Home Assistive Devices/Equipment: None ADL Screening (condition at time of admission) Patient's cognitive ability adequate to safely complete daily activities?: No Is the patient deaf or have difficulty hearing?: No Does the patient have difficulty seeing, even when wearing glasses/contacts?: Yes Does the patient have difficulty concentrating, remembering, or making decisions?: Yes Patient able to express need for assistance with ADLs?: Yes Does the patient  have difficulty dressing or bathing?: Yes Independently performs ADLs?: No Dressing (OT): Needs assistance Grooming: Needs assistance Feeding: Needs assistance Bathing: Needs assistance Toileting: Needs assistance In/Out Bed: Needs assistance Walks in Home: Needs assistance Does the patient have difficulty walking or climbing stairs?: Yes Weakness of Legs: Both Weakness of Arms/Hands: Both  Permission Sought/Granted                  Emotional Assessment              Admission diagnosis:  Hypokalemia [E87.6] Upper GI bleed [K92.2] Symptomatic anemia [D64.9] Anemia, unspecified type [D64.9] GI hemorrhage [K92.2] Patient Active Problem List   Diagnosis Date Noted   GI hemorrhage 07/06/2021   Iron deficiency anemia due to chronic blood loss    B12 deficiency    Anemia    Symptomatic anemia 07/05/2021   Unspecified atrial fibrillation (HCC) 07/05/2021   Melena    Heme positive stool    Chronic anticoagulation    Hypokalemia    Abnormal electrocardiogram (ECG) (EKG)    PCP:  Marylynn Pearson, FNP Pharmacy:   [facility] [phone] - [place], [place] - [address] AT [facility] [address] [place] [address] Phone: [phone] Fax: [phone]     Social Determinants of Health (SDOH) Interventions    Readmission Risk Interventions No flowsheet data found.

## 2021-07-07 ENCOUNTER — Inpatient Hospital Stay (HOSPITAL_COMMUNITY): Payer: Medicare Other

## 2021-07-07 ENCOUNTER — Encounter (HOSPITAL_COMMUNITY)
Admission: EM | Disposition: A | Discharge status: Home Health | Payer: Self-pay | Source: Home / Self Care | Attending: Family Medicine

## 2021-07-07 LAB — BASIC METABOLIC PANEL
Anion gap: 9 (ref 5–15)
BUN: 18 mg/dL (ref 8–23)
CO2: 23 mmol/L (ref 22–32)
Calcium: 7.7 mg/dL — ABNORMAL LOW (ref 8.9–10.3)
Chloride: 102 mmol/L (ref 98–111)
Creatinine, Ser: 0.76 mg/dL (ref 0.44–1.00)
GFR, Estimated: >60 mL/min (ref >60)
Glucose, Bld: 90 mg/dL (ref 70–99)
Potassium: 3.4 mmol/L — ABNORMAL LOW (ref 3.5–5.1)
Sodium: 134 mmol/L — ABNORMAL LOW (ref 135–145)

## 2021-07-07 LAB — MAGNESIUM: Magnesium: 1.8 mg/dL (ref 1.7–2.4)

## 2021-07-07 LAB — CBC
HCT: 27.7 % — ABNORMAL LOW (ref 36.0–46.0)
Hemoglobin: 9.4 g/dL — ABNORMAL LOW (ref 12.0–15.0)
MCH: 29.7 pg (ref 26.0–34.0)
MCHC: 33.9 g/dL (ref 30.0–36.0)
MCV: 87.7 fL (ref 80.0–100.0)
Platelets: 166 10*3/uL (ref 150–400)
RBC: 3.16 MIL/uL — ABNORMAL LOW (ref 3.87–5.11)
RDW: 13.9 % (ref 11.5–15.5)
WBC: 9.5 10*3/uL (ref 4.0–10.5)
nRBC: 0 % (ref 0.0–0.2)

## 2021-07-07 LAB — GLUCOSE, CAPILLARY
Glucose-Capillary: 101 mg/dL — ABNORMAL HIGH (ref 70–99)
Glucose-Capillary: 134 mg/dL — ABNORMAL HIGH (ref 70–99)
Glucose-Capillary: 181 mg/dL — ABNORMAL HIGH (ref 70–99)

## 2021-07-07 LAB — PROTIME-INR
INR: 1.4 — ABNORMAL HIGH (ref 0.8–1.2)
Prothrombin Time: 16.8 seconds — ABNORMAL HIGH (ref 11.4–15.2)

## 2021-07-07 SURGERY — ESOPHAGOGASTRODUODENOSCOPY (EGD) WITH PROPOFOL
Anesthesia: Choice

## 2021-07-07 MED ORDER — POTASSIUM CHLORIDE 10 MEQ/100ML IV SOLN
10.0000 meq | INTRAVENOUS | Status: AC
  Administered 2021-07-07: 10 meq via INTRAVENOUS
  Filled 2021-07-07 (×3): qty 100

## 2021-07-07 MED ORDER — SODIUM CHLORIDE 0.9 % IV BOLUS
500.0000 mL | Freq: Once | INTRAVENOUS | Status: AC
  Administered 2021-07-07: 500 mL via INTRAVENOUS

## 2021-07-07 NOTE — Progress Notes (Signed)
Pt alert and oriented to person, place and situation. Denies any c/o at present. Skin warm and dry, color WNL. Current VSS, b/p 121/83, HR 75 SR, resp 18/min, SaO2 97% on RA. NS bolus completed. Pt sitting up in bed doing word search puzzle at present. Bed alarm on for safety.

## 2021-07-07 NOTE — Progress Notes (Signed)
PROGRESS NOTE    Kathleen Barnett  YYQ:[id number] DOB: [birth year] DOA: 07/05/2021 PCP: Marylynn Pearson, FNP, [facility]   Brief Narrative:   Kathleen Barnett is a 80 y.o. 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  Kathleen Barnett has had some near syncopal episodes of intermittently feeling dizzy.  She has had some intermittent chest discomfort with exertion lasting about 5 to 10 minutes.  She states that it improves when she rests.  She denies any worsening peripheral edema, orthopnea, PND.  She denies any fevers, chills, headache, neck pain, coughing, hemoptysis.  The Kathleen Barnett is unable to find any further history regarding her heart valve.  She denies any abdominal pain, but states that she has been having some occasional melanotic stool over the past 2 to 3 weeks.  She denies any frank hematochezia, dysuria, hematuria.  She denies any visual disturbance or focal extremity weakness. In the emergency department, the Kathleen Barnett was afebrile and hemodynamically stable with oxygen saturation 98% on room air.  BMP showed a sodium of 134, potassium 2.9, serum creatinine 1.00.  WBC 7.4, hemoglobin 10.8, platelets 213,000.  D-dimer was 0.31.  BNP was 40.  INR >10. Troponin 11>>11.  Chest x-ray showed mild cardiomegaly without any infiltrates or edema.  FOBT was positive.  CT of the brain was negative for acute findings.  GI was consulted to assist with management.  Cardiology was consulted also for abnormal EKG.   Vitamin K and FFP were given with improvement in INR. In speaking w daughter Efraim Kaufmann, Kathleen Barnett has been following with [facility] clinic for INR check. Home meds include 7.5mg  Coumadin every day, naproxen 500mg  BID for many years. Daughter is advised to not continue  NSAIDs.   7/20 - Kathleen Barnett is doing okay; reports 1 episode of dark stool over night. INR has trended down to 1.5. Cardiology Dr. 8/20 evaluated Kathleen Barnett and recommends echocardiology to evaluate for atrial valve replacement. Further attempts at anticoagulation will depend on GI status and review of risk/benefit. GI (Dr. Diona Browner) saw Kathleen Barnett, thought Kathleen Barnett to have upper GIB of unclear source. Considering endoscopic evaluation. Hold NSAIDS, continue PPI twice daily IV.   7/21 - Kathleen Barnett is awake this morning, awaiting EGD for further evaluation of melena. NPO since midnight. PT evaluated Kathleen Barnett, recommending HHPT with rolling walker.   Assessment & Plan:   Active Problems:   Symptomatic anemia   Melena   Heme positive stool   Chronic anticoagulation   Hypokalemia   Unspecified atrial fibrillation (HCC)   Iron deficiency anemia due to chronic blood loss   B12 deficiency   Anemia   GI hemorrhage   Melena/symptomatic anemia/FOBT+ -Kathleen Barnett presented with hemoglobin 10.8 > 8.6 > 9.4 -Baseline hemoglobin 12-13 -GI consulted -Continue PPI bid -Consider EGD once bleeding is stable -NPO since midnight, will advance to clear liquid after EGD pending GI recs -Anemia panel consistent with IDA with ferritin 5 - iron infusion  - Pharmacy consulted for recommendations on warfarin dosing.    Coagulopathy -INR>10 > 1.5 > 1.4 stable -Vitamin K 5 mg IV and FFP x 1 unit given 7/19   Iron-deficiency anemia - likely from poor nutrition as Kathleen Barnett has not been eating much in the past 2 weeks (per daughter). Kathleen Barnett has exhibited some weakness.  - Anemia panel showed ferritin 5, iron  47 - Will start IV iron infusion - Hgb stable at 9.4 this AM    Frequent PACs -Cardiology consult--Dr. Deforest HoylesAkhter reviewed EKG - EKG shows sinus tachycardia (rate 102 bpm) with frequent PACs. There is left axis deviation and low voltage QRS in precordial leads. Qtc 463 ms. - Dr. Diona BrownerMcDowell saw Kathleen Barnett 7/20, reviewed  telemetry, documenting that it shows sinus rhythm with frequent PACs and bursts of PAT/AF with no evidence of high degree heart block.  -Optimize electrolytes -Remain on telemetry -no pauses noted thus far - Cardiology continues to follow  Diabetes mellitus type 2 -Holding metformin - BG 100s -NovoLog sliding scale - A1c 6.5    History of heart valve replacement -Daughter could not locate card for details.  - continue imdur 30mg    Cognitive impairment/dementia -Continue Aricept and Namenda -Continue Seroquel at bedtime   Hyperlipidemia -Continue statin   Hypokalemia/ hypomagnesemia -replete   Left Breast Rash -chaperone present with Abigail, NT -appears to have small patch of atopic dermatitis under left breast--no signs of infection -prn topical hydrocortisone  DVT prophylaxis: SCD   Code Status: DNR  - Kathleen has provided documentation to nurse.  Family Communication: daughter Sonia BallerMelissa Barnett over phone [facility]. Consultants:  Cardiology (Dr. Diona BrownerMcDowell) Gastroenterology (Dr. Levon Hedgerastaneda)   Procedures:  None  Antimicrobials:  Antibiotics Given (last 72 hours)     None      Subjective: Kathleen Barnett evaluated at bedside. Kathleen Barnett with no acute complaint. No chest pain. Awaiting EGD.   Objective: Vitals:   07/06/21 0410 07/06/21 1343 07/06/21 2044 07/07/21 0442  BP: (!) 127/94 115/87 107/71 123/60  Pulse: 91 90 79 72  Resp: 18 17 18 18   Temp: 98 F (36.7 C) 98.4 F (36.9 C) 98.4 F (36.9 C) 97.6 F (36.4 C)  TempSrc: Oral   Oral  SpO2: 98% 100% 95% 94%  Weight:    81 kg  Height:        Intake/Output Summary (Last 24 hours) at 07/07/2021 0900 Last data filed at 07/07/2021 0300 Gross per 24 hour  Intake 2190.62 ml  Output --  Net 2190.62 ml   Filed Weights   07/05/21 0809 07/05/21 1513 07/07/21 0442  Weight: 90.7 kg 82 kg 81 kg    Examination:  General exam: Appears calm and in no acute distress. Respiratory  system: Clear to auscultation bilaterally. No labored breathing. Cardiovascular system: RRR, no rub, no gallop, no S3 Gastrointestinal system:  soft/NT, +BS, nondistended, no peritoneal signs Extremities: No cyanosis, No rashes, No petechiae, No lymphangitis, No edema Neuro: CNII-XII grossly intact. Awake and alert.  Psychiatry: Mood and affect in full range.   Data Reviewed: I have personally reviewed following labs and imaging studies  CBC: Recent Labs  Lab 07/05/21 0855 07/06/21 0402 07/07/21 0409  WBC 7.4 6.0 9.5  HGB 10.8* 8.6* 9.4*  HCT 31.9* 25.2* 27.7*  MCV 85.5 86.0 87.7  PLT 213 176 166   Basic Metabolic Panel: Recent Labs  Lab 07/05/21 0855 07/06/21 0402 07/07/21 0409  NA 134* 138 134*  K 2.9* 3.4* 3.4*  CL 98 102 102  CO2 24 29 23   GLUCOSE 132* 83 90  BUN 43* 27* 18  CREATININE 1.00 0.83 0.76  CALCIUM 8.3* 8.4* 7.7*  MG  --  1.3* 1.8   GFR: Estimated Creatinine Clearance: 63.5 mL/min (by C-G formula based on SCr of 0.76 mg/dL). Liver Function Tests: No results for input(s): AST, ALT, ALKPHOS, BILITOT, PROT, ALBUMIN in the last  168 hours. No results for input(s): LIPASE, AMYLASE in the last 168 hours. No results for input(s): AMMONIA in the last 168 hours. Coagulation Profile: Recent Labs  Lab 07/05/21 1404 07/06/21 0402 07/07/21 0409  INR >10.0* 1.5* 1.4*   Cardiac Enzymes: No results for input(s): CKTOTAL, CKMB, CKMBINDEX, TROPONINI in the last 168 hours. BNP (last 3 results) No results for input(s): PROBNP in the last 8760 hours. HbA1C: Recent Labs    07/05/21 0855  HGBA1C 6.5*   CBG: Recent Labs  Lab 07/06/21 1123 07/06/21 1612 07/06/21 1855 07/06/21 2014 07/07/21 0732  GLUCAP 94 106* 166* 238* 101*   Lipid Profile: No results for input(s): CHOL, HDL, LDLCALC, TRIG, CHOLHDL, LDLDIRECT in the last 72 hours. Thyroid Function Tests: No results for input(s): TSH, T4TOTAL, FREET4, T3FREE, THYROIDAB in the last 72 hours. Anemia  Panel: Recent Labs    07/05/21 0855 07/05/21 1405  VITAMINB12  --  169*  FOLATE  --  9.8  FERRITIN  --  5*  TIBC  --  353  IRON  --  47  RETICCTPCT 1.9  --    Urine analysis:    Component Value Date/Time   COLORURINE AMBER (A) 08/09/2019 1838   APPEARANCEUR HAZY (A) 08/09/2019 1838   LABSPEC 1.036 (H) 08/09/2019 1838   PHURINE 5.0 08/09/2019 1838   GLUCOSEU NEGATIVE 08/09/2019 1838   HGBUR NEGATIVE 08/09/2019 1838   BILIRUBINUR SMALL (A) 08/09/2019 1838   KETONESUR 5 (A) 08/09/2019 1838   PROTEINUR 30 (A) 08/09/2019 1838   NITRITE NEGATIVE 08/09/2019 1838   LEUKOCYTESUR MODERATE (A) 08/09/2019 1838    Recent Results (from the past 240 hour(s))  Resp Panel by RT-PCR (Flu A&B, Covid) Nasopharyngeal Swab     Status: None   Collection Time: 07/05/21  8:55 AM   Specimen: Nasopharyngeal Swab; Nasopharyngeal(NP) swabs in vial transport medium  Result Value Ref Range Status   SARS Coronavirus 2 by RT PCR NEGATIVE NEGATIVE Final    Comment: (NOTE) SARS-CoV-2 target nucleic acids are NOT DETECTED.  The SARS-CoV-2 RNA is generally detectable in upper respiratory specimens during the acute phase of infection. The lowest concentration of SARS-CoV-2 viral copies this assay can detect is 138 copies/mL. A negative result does not preclude SARS-Cov-2 infection and should not be used as the sole basis for treatment or other Kathleen Barnett management decisions. A negative result may occur with  improper specimen collection/handling, submission of specimen other than nasopharyngeal swab, presence of viral mutation(s) within the areas targeted by this assay, and inadequate number of viral copies(<138 copies/mL). A negative result must be combined with clinical observations, Kathleen Barnett history, and epidemiological information. The expected result is Negative.  Fact Sheet for Patients:  BloggerCourse.com  Fact Sheet for Healthcare Providers:   SeriousBroker.it  This test is no t yet approved or cleared by the [place] FDA and  has been authorized for detection and/or diagnosis of SARS-CoV-2 by FDA under an Emergency Use Authorization (EUA). This EUA will remain  in effect (meaning this test can be used) for the duration of the COVID-19 declaration under Section 564(b)(1) of the Act, 21 U.S.C.section 360bbb-3(b)(1), unless the authorization is terminated  or revoked sooner.       Influenza A by PCR NEGATIVE NEGATIVE Final   Influenza B by PCR NEGATIVE NEGATIVE Final    Comment: (NOTE) The Xpert Xpress SARS-CoV-2/FLU/RSV plus assay is intended as an aid in the diagnosis of influenza from Nasopharyngeal swab specimens and should not be used as a sole basis  for treatment. Nasal washings and aspirates are unacceptable for Xpert Xpress SARS-CoV-2/FLU/RSV testing.  Fact Sheet for Patients: BloggerCourse.com  Fact Sheet for Healthcare Providers: SeriousBroker.it  This test is not yet approved or cleared by the [place] FDA and has been authorized for detection and/or diagnosis of SARS-CoV-2 by FDA under an Emergency Use Authorization (EUA). This EUA will remain in effect (meaning this test can be used) for the duration of the COVID-19 declaration under Section 564(b)(1) of the Act, 21 U.S.C. section 360bbb-3(b)(1), unless the authorization is terminated or revoked.  Performed at [facility], [address], [place], [place] [address]     Radiology Studies: CT Head Wo Contrast  Result Date: 07/05/2021 CLINICAL DATA:  Neuro deficit, acute, stroke suspected. Additional history provided: Kathleen Barnett reports increased weakness for 2 weeks. EXAM: CT HEAD WITHOUT CONTRAST TECHNIQUE: Contiguous axial images were obtained from the base of the skull through the vertex without intravenous contrast. COMPARISON:  Brain MRI 09/04/2019. FINDINGS: Brain:  Mild-to-moderate generalized cerebral atrophy. 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 Mild-to-moderate patchy and ill-defined hypoattenuation within the cerebral white matter, nonspecific but compatible chronic small vessel ischemic disease. There is no acute intracranial hemorrhage. No demarcated cortical infarct. No extra-axial fluid collection. No evidence of an intracranial mass. No midline shift. Vascular: No hyperdense vessel.  Atherosclerotic calcifications Skull: Normal. Negative for fracture or focal lesion. Sinuses/Orbits: Visualized orbits show no acute finding. No significant paranasal sinus disease at the imaged levels. 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 Electronically Signed   By: Jackey Loge DO   On: 07/05/2021 09:38   DG Chest Portable 1 View  Result Date: 07/05/2021 CLINICAL DATA:  Weakness.  Shortness of breath. EXAM: PORTABLE CHEST 1 VIEW COMPARISON:  08/09/2019. FINDINGS: Mediastinum and hilar structures normal. Prior cardiac valve replacement. Mild cardiomegaly. No pulmonary venous congestion. Calcified pulmonary nodules again noted consistent with granulomas. No focal infiltrate. No pleural effusion or pneumothorax. IMPRESSION: Valve replacement. Mild cardiomegaly. No pulmonary venous congestion. No acute pulmonary disease. Electronically Signed   By: Maisie Fus  Register   On: 07/05/2021 09:24    Scheduled Meds:  sodium chloride   Intravenous Once   atorvastatin  20 mg Oral QPM   donepezil  10 mg Oral Daily   feeding supplement  1 Container Oral TID BM   insulin aspart  0-9 Units Subcutaneous TID WC   isosorbide mononitrate  30 mg Oral Daily   memantine  10 mg  Oral BID   pantoprazole (PROTONIX) IV  40 mg Intravenous Q12H   QUEtiapine  25 mg Oral QHS   traZODone  150 mg Oral QHS   Continuous Infusions:  sodium chloride 50 mL/hr at 07/07/21 0300   ferric gluconate (FERRLECIT) IVPB 250 mg (07/06/21 1436)   potassium chloride 10 mEq (07/07/21 0854)     LOS: 1 day   Time spent: 30 minutes  Elige Ko, Medical student [facility] Pager [phone]  If 7PM-7AM, please contact night-coverage [email or web address] Password [facility] 07/07/2021, 9:00 AM   ATTENDING NOTE   Kathleen Barnett seen and examined with Elige Ko, Medical student. In addition to supervising the encounter, I played a key role in the decision making process as well as reviewed key findings.  I agree with plan of care as  noted.  Please see orders.  Rodney Langton, MD FAAFP [facility] How to contact the [facility] Attending or Consulting provider 7A - 7P or covering provider during after hours 7P -7A, for this Kathleen Barnett?  Check the care team in [facility] and look for a) attending/consulting TRH provider listed and b) the [facility] team listed Log into [email or web address] and use [place]'s universal password to access. If you do not have the password, please contact the hospital operator. Locate the [facility] provider you are looking for under [facility] and page to a number that you can be directly reached. If you still have difficulty reaching the provider, please page the [employer] [facility] (Director on Call) for the Hospitalists listed on amion for assistance.

## 2021-07-07 NOTE — Progress Notes (Signed)
1305: Code Blue alarm activated for room 307. Arrived to room to find pt on toilet with 3 staff members in attendance. Pt gray in color, diaphoretic, head down with staff supporting her in a seated position. Pt was awake, mumbling conversation. Staff states pt to BR, became weak and vision blurry, then ? syncopal episode. Pt able to stand with assist x3 and ambulate to bed, pt incontinent of liquid green stool.  1309: Once in bed, pt remains gray in color, diaphoretic, c/o nausea and heaving but no measurable vomitus noted. Pt s/o SOB, resp 40/min, SaO2 100%, HR reading 130 bpm. B/p 141/104. Dr. [name] at bedside and given update on sx and current condition. Pt placed on tele monitor, showing ST 120-130 bpm.   1312: Repeat b/p 134/96, HR remains ST 115-120 bpm , resp 36/min. Pt still diaphoretic and pale. States still feels SOB but denies any chest pain or n/v.Order received for Normal Saline bolus now and repeat vital signs.  1320: NS bolus infusing per order. Current b/p 117/83, HR 92 bpm, resp down to 24/min. Pt's skin drier, color still pale. Pt denies SOB at this time, denies any c/o pain.  1330: RT in to perform EKG. NS bolus continues. Pt's color WNL, skin dry, resp even and nonlabored at 20/min, HR 81, SaO2 95% on RA, b/p 126/88. Pt alert, oriented x3, keeps apologizing for "causing such a rucous". Pt reassured that we are just glad she is feeling better. Pt talkative, cooperative.

## 2021-07-07 NOTE — Care Management Important Message (Signed)
Important Message  Patient Details  Name: Kathleen Barnett MRN: [id number] Date of Birth: 12/06/1941   Medicare Important Message Given:  Yes     Corey Harold 07/07/2021, 4:19 PM

## 2021-07-07 NOTE — Progress Notes (Addendum)
Palliative consult received-   Discussed with primary team. They requested to hold off on consult at this time. Will cancel consult for now- please feel free to reconsult if needed.  Ocie Bob, AGNP-C Palliative Medicine  No charge note

## 2021-07-07 NOTE — Progress Notes (Signed)
RAPID RESPONSE EVENT   At 1:15 PM, patient went to use the restroom and became diaphoretic, tachycardic, and almost fell. RN reports that patient was not straining and was having loose stools. RN called CODE BLUE, which was then soon terminated, given DNR status. The patient was assisted to bed. Response team and MD [name] came to bedside, found patient to be pale and diaphoretic. Patient was tachycardic with pulse up to 113, BP 141/110, RR 31, and O2 sat was 99%. 1L NS bolus given. Vital signs were repeated and showed returning to normal values. Tele shows sinus tachycardia. An EKG was obtained and showed... A CXR was obtained and show no acute findings.  Pt is feeling much better now.  Discussed with anesthesiologist Dr. Alva Garnet and he will postpone procedure until tomorrow.     Patient's condition is most likely due to vasovagal response. Case was discussed with anesthesiologist who participates in patient's EGD that is scheduled for today. Patient's EGD is postponed for tomorrow given this rapid response.   Standley Dakins MD How to contact the [facility] Attending or Consulting provider 7A - 7P or covering provider during after hours 7P -7A, for this patient?  Check the care team in [facility] and look for a) attending/consulting TRH provider listed and b) the [facility] team listed Log into [email or web address] and use [place]'s universal password to access. If you do not have the password, please contact the hospital operator. Locate the [facility] provider you are looking for under [facility] and page to a number that you can be directly reached. If you still have difficulty reaching the provider, please page the [facility] (Director on Call) for the Hospitalists listed on amion for assistance.

## 2021-07-08 ENCOUNTER — Inpatient Hospital Stay (HOSPITAL_COMMUNITY): Payer: Medicare Other | Admitting: Anesthesiology

## 2021-07-08 ENCOUNTER — Encounter (HOSPITAL_COMMUNITY): Payer: Self-pay | Admitting: Family Medicine

## 2021-07-08 ENCOUNTER — Encounter (HOSPITAL_COMMUNITY)
Admission: EM | Disposition: A | Discharge status: Home Health | Payer: Self-pay | Source: Home / Self Care | Attending: Family Medicine

## 2021-07-08 ENCOUNTER — Other Ambulatory Visit: Payer: Self-pay

## 2021-07-08 DIAGNOSIS — K449 Diaphragmatic hernia without obstruction or gangrene: Secondary | ICD-10-CM

## 2021-07-08 DIAGNOSIS — K2980 Duodenitis without bleeding: Secondary | ICD-10-CM

## 2021-07-08 DIAGNOSIS — K259 Gastric ulcer, unspecified as acute or chronic, without hemorrhage or perforation: Secondary | ICD-10-CM

## 2021-07-08 HISTORY — PX: ESOPHAGOGASTRODUODENOSCOPY (EGD) WITH PROPOFOL: SHX5813

## 2021-07-08 HISTORY — PX: BIOPSY: SHX5522

## 2021-07-08 LAB — GLUCOSE, CAPILLARY
Glucose-Capillary: 94 mg/dL (ref 70–99)
Glucose-Capillary: 97 mg/dL (ref 70–99)
Glucose-Capillary: 116 mg/dL — ABNORMAL HIGH (ref 70–99)
Glucose-Capillary: 118 mg/dL — ABNORMAL HIGH (ref 70–99)

## 2021-07-08 LAB — BASIC METABOLIC PANEL
Anion gap: 5 (ref 5–15)
BUN: 16 mg/dL (ref 8–23)
CO2: 26 mmol/L (ref 22–32)
Calcium: 7.8 mg/dL — ABNORMAL LOW (ref 8.9–10.3)
Chloride: 108 mmol/L (ref 98–111)
Creatinine, Ser: 0.75 mg/dL (ref 0.44–1.00)
GFR, Estimated: >60 mL/min (ref >60)
Glucose, Bld: 98 mg/dL (ref 70–99)
Potassium: 3 mmol/L — ABNORMAL LOW (ref 3.5–5.1)
Sodium: 139 mmol/L (ref 135–145)

## 2021-07-08 LAB — PROTIME-INR
INR: 1.4 — ABNORMAL HIGH (ref 0.8–1.2)
Prothrombin Time: 17.6 seconds — ABNORMAL HIGH (ref 11.4–15.2)

## 2021-07-08 LAB — MAGNESIUM: Magnesium: 1.5 mg/dL — ABNORMAL LOW (ref 1.7–2.4)

## 2021-07-08 LAB — CBC
HCT: 25 % — ABNORMAL LOW (ref 36.0–46.0)
Hemoglobin: 8.3 g/dL — ABNORMAL LOW (ref 12.0–15.0)
MCH: 29.5 pg (ref 26.0–34.0)
MCHC: 33.2 g/dL (ref 30.0–36.0)
MCV: 89 fL (ref 80.0–100.0)
Platelets: 170 10*3/uL (ref 150–400)
RBC: 2.81 MIL/uL — ABNORMAL LOW (ref 3.87–5.11)
RDW: 14.4 % (ref 11.5–15.5)
WBC: 8.4 10*3/uL (ref 4.0–10.5)
nRBC: 0 % (ref 0.0–0.2)

## 2021-07-08 SURGERY — ESOPHAGOGASTRODUODENOSCOPY (EGD) WITH PROPOFOL
Anesthesia: General

## 2021-07-08 MED ORDER — MAGNESIUM SULFATE 4 GM/100ML IV SOLN
4.0000 g | Freq: Once | INTRAVENOUS | Status: AC
  Administered 2021-07-08: 4 g via INTRAVENOUS
  Filled 2021-07-08: qty 100

## 2021-07-08 MED ORDER — POTASSIUM CHLORIDE CRYS ER 20 MEQ PO TBCR
20.0000 meq | EXTENDED_RELEASE_TABLET | Freq: Once | ORAL | Status: AC
  Administered 2021-07-08: 20 meq via ORAL
  Filled 2021-07-08: qty 1

## 2021-07-08 MED ORDER — PROPOFOL 10 MG/ML IV BOLUS
INTRAVENOUS | Status: DC | PRN
Start: 2021-07-08 — End: 2021-07-08
  Administered 2021-07-08 (×2): 70 mg via INTRAVENOUS

## 2021-07-08 MED ORDER — SODIUM CHLORIDE 0.9 % IV SOLN
INTRAVENOUS | Status: DC
Start: 2021-07-08 — End: 2021-07-08

## 2021-07-08 MED ORDER — LACTATED RINGERS IV SOLN: INTRAVENOUS | Status: DC

## 2021-07-08 MED ORDER — POTASSIUM CHLORIDE 10 MEQ/100ML IV SOLN
10.0000 meq | INTRAVENOUS | Status: AC
Start: 2021-07-08 — End: 2021-07-08
  Administered 2021-07-08 (×2): 10 meq via INTRAVENOUS
  Filled 2021-07-08 (×4): qty 100

## 2021-07-08 MED ORDER — SODIUM CHLORIDE 0.9 % IV SOLN: INTRAVENOUS | Status: DC

## 2021-07-08 MED ORDER — POTASSIUM CHLORIDE 10 MEQ/100ML IV SOLN
10.0000 meq | INTRAVENOUS | Status: AC
  Administered 2021-07-08 (×2): 10 meq via INTRAVENOUS

## 2021-07-08 MED ORDER — LIDOCAINE HCL (CARDIAC) PF 50 MG/5ML IV SOSY
PREFILLED_SYRINGE | INTRAVENOUS | Status: DC | PRN
  Administered 2021-07-08: 60 mg via INTRAVENOUS

## 2021-07-08 MED ORDER — STERILE WATER FOR IRRIGATION IR SOLN
Status: DC | PRN
  Administered 2021-07-08: 100 mL

## 2021-07-08 NOTE — Anesthesia Procedure Notes (Signed)
Date/Time: 07/08/2021 2:07 PM Performed by: Franco Nones, CRNA Pre-anesthesia Checklist: Patient identified, Emergency Drugs available, Suction available, Timeout performed and Patient being monitored Patient Re-evaluated:Patient Re-evaluated prior to induction Oxygen Delivery Method: Nasal Cannula

## 2021-07-08 NOTE — Anesthesia Postprocedure Evaluation (Signed)
Anesthesia Post Note  Patient: Kathleen Barnett  Procedure(s) Performed: ESOPHAGOGASTRODUODENOSCOPY (EGD) WITH PROPOFOL BIOPSY  Patient location during evaluation: PACU Anesthesia Type: General Level of consciousness: awake and alert and oriented Pain management: pain level controlled Vital Signs Assessment: post-procedure vital signs reviewed and stable Respiratory status: spontaneous breathing and respiratory function stable Cardiovascular status: blood pressure returned to baseline and stable Postop Assessment: no apparent nausea or vomiting Anesthetic complications: no   No notable events documented.   Last Vitals:  Vitals:   07/08/21 1455 07/08/21 1508  BP:  114/71  Pulse: 64 64  Resp: 16 16  Temp:  36.8 C  SpO2: 97% 100%    Last Pain:  Vitals:   07/08/21 1508  TempSrc: Oral  PainSc:                  Jemuel Laursen C Naileah Karg

## 2021-07-08 NOTE — Progress Notes (Signed)
PROGRESS NOTE    Kathleen HerringJanice E Bahar  ZOX:[id number] DOB: 05/08/1941 DOA: 07/05/2021 PCP: Marylynn PearsonMcCorkle, Tenika, FNP   Brief Narrative:   Kathleen Barnett is a 80 y.o. 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  Patient has had some near syncopal episodes of intermittently feeling dizzy.  She has had some intermittent chest discomfort with exertion lasting about 5 to 10 minutes.  She states that it improves when she rests.  She denies any worsening peripheral edema, orthopnea, PND.  She denies any fevers, chills, headache, neck pain, coughing, hemoptysis.  The patient is unable to find any further history regarding her heart valve.  She denies any abdominal pain, but states that she has been having some occasional melanotic stool over the past 2 to 3 weeks.  She denies any frank hematochezia, dysuria, hematuria.  She denies any visual disturbance or focal extremity weakness. In the emergency department, the patient was afebrile and hemodynamically stable with oxygen saturation 98% on room air.  BMP showed a sodium of 134, potassium 2.9, serum creatinine 1.00.  WBC 7.4, hemoglobin 10.8, platelets 213,000.  D-dimer was 0.31.  BNP was 40.  INR >10. Troponin 11>>11.  Chest x-ray showed mild cardiomegaly without any infiltrates or edema.  FOBT was positive.  CT of the brain was negative for acute findings.  GI was consulted to assist with management.  Cardiology was consulted also for abnormal EKG.   Vitamin K and FFP were given with improvement in INR. In speaking w daughter Efraim KaufmannMelissa, [facility]. Home meds include 7.5mg  Coumadin every day, naproxen 500mg  BID for many years. Daughter is advised to not continue NSAIDs.   7/20 -  Patient is doing okay; reports 1 episode of dark stool over night. INR has trended down to 1.5. Cardiology Dr. Diona BrownerMcDowell evaluated patient and recommends echocardiology to evaluate for atrial valve replacement. Further attempts at anticoagulation will depend on GI status and review of risk/benefit. GI (Dr. Levon Hedgerastaneda) saw patient, thought patient to have upper GIB of unclear source. Considering endoscopic evaluation. Hold NSAIDS, continue PPI twice daily IV.    7/21 - Patient is awake this morning, awaiting EGD for further evaluation of melena. NPO since midnight. PT evaluated patient, recommending HHPT with rolling walker.   7/22 -  Had [facility]al [facility] at [facility] yesterday. Canceled EDG. Will reattempt today.   Assessment & Plan:   Active Problems:   Symptomatic anemia   Melena   Heme positive stool   Chronic anticoagulation   Hypokalemia   Unspecified atrial fibrillation (HCC)   Iron deficiency anemia due to chronic blood loss   B12 deficiency   Anemia   GI hemorrhage   Melena/symptomatic anemia/FOBT+ -Patient presented with hemoglobin 10.8 > 8.6 > 9.4 > 8.3 -Baseline hemoglobin 12-13 -GI consulted -Continue PPI bid -EGD today  -NPO since 7/20, will advance to clear liquid after EGD pending GI recs - Anemia panel consistent with IDA with ferritin 5 - iron infusion  - Pharmacy consulted for recommendations on warfarin dosing.    Coagulopathy -INR>10 > 1.5 > 1.4 stable -Vitamin K 5 mg IV and FFP x 1 unit given 7/19   Iron-deficiency anemia - likely from poor nutrition as patient has not been eating much in the past 2 weeks (  per daughter). Patient has exhibited some weakness.  - Anemia panel showed ferritin 5, iron 47 - Will start IV iron infusion - Hgb stable at 9.4 this AM   Frequent PACs -Cardiology consult--Dr. Deforest Hoyles reviewed EKG - EKG shows sinus tachycardia (rate 102 bpm) with frequent PACs. There is left axis deviation and low voltage QRS in precordial leads. Qtc 463  ms. - Dr. Diona Browner saw patient 7/20, reviewed telemetry, documenting that it shows sinus rhythm with frequent PACs and bursts of PAT/AF with no evidence of high degree heart block.  -Optimize electrolytes -Remain on telemetry -no pauses noted thus far - Cardiology continues to follow   Hypokalemia/hypomag - K 3.0, Mg 1.5 - Replete and monitor  Diabetes mellitus type 2 -Holding metformin - BG 90s - 100s -NovoLog sliding scale - A1c 6.5   CBG (last 3)  Recent Labs    07/08/21 0737 07/08/21 1204 07/08/21 1449  GLUCAP 94 97 116*   History of heart valve replacement -Daughter could not locate card for details. - continue imdur 30mg    Cognitive impairment/dementia -Continue Aricept and Namenda -Continue Seroquel at bedtime   Hyperlipidemia -Continue statin   Severe cellulitis and abscess of right foot - s/p debridement by ED and Dr. - looking much better, follow up wound cultures   Hypokalemia/ hypomagnesemia -replete   Left Breast Rash -chaperone present with Abigail, NT -appears to have small patch of atopic dermatitis under left breast--no signs of infection -prn topical hydrocortisone   DVT prophylaxis: SCD    Code Status: DNR  - Melissa has provided documentation to nurse. Family Communication: daughter Henreitta Leber over phone Disposition Plan: home with [facility], standard walker. Consultants:  Cardiology (Dr. [facility]) Gastroenterology (Dr. Diona Browner)   Procedures:  None   Antimicrobials:    Subjective: Pt reports that she has no specific complaints.    Objective: Vitals:   07/07/21 1319 07/07/21 1412 07/07/21 2114 07/08/21 0419  BP: 117/83 110/81 115/63 103/61  Pulse: 88 89 74 80  Resp: 20 17 18 16   Temp: 97.6 F (36.4 C) 97.7 F (36.5 C) 98 F (36.7 C) 98 F (36.7 C)  TempSrc:  Oral Oral Oral  SpO2:  96% 98% 97%  Weight:      Height:        Intake/Output Summary (Last 24 hours) at 07/08/2021 1049 Last data filed at 07/08/2021  0900 Gross per 24 hour  Intake 1046.62 ml  Output 400 ml  Net 646.62 ml   Filed Weights   07/05/21 0809 07/05/21 1513 07/07/21 0442  Weight: 90.7 kg 82 kg 81 kg   Examination:  General exam: Appears calm and in no acute distress. Respiratory system: Clear to auscultation bilaterally. No labored breathing. Cardiovascular system: no rub, no gallop, no S3 Gastrointestinal system:  soft/NT, +BS, nondistended, no peritoneal signs Extremities: No cyanosis, No rashes, No petechiae, No lymphangitis, No edema Neuro: CNII-XII grossly intact. Awake and alert.  Psychiatry: Mood and affect in full range.   Data Reviewed: I have personally reviewed following labs and imaging studies  CBC: Recent Labs  Lab 07/05/21 0855 07/06/21 0402 07/07/21 0409 07/08/21 0401  WBC 7.4 6.0 9.5 8.4  HGB 10.8* 8.6* 9.4* 8.3*  HCT 31.9* 25.2* 27.7* 25.0*  MCV 85.5 86.0 87.7 89.0  PLT 213 176 166 170   Basic Metabolic Panel: Recent Labs  Lab 07/05/21 0855 07/06/21 0402 07/07/21 0409 07/08/21 0401  NA 134* 138 134* 139  K 2.9* 3.4* 3.4* 3.0*  CL 98 102 102  108  CO2 24 29 23 26   GLUCOSE 132* 83 90 98  BUN 43* 27* 18 16  CREATININE 1.00 0.83 0.76 0.75  CALCIUM 8.3* 8.4* 7.7* 7.8*  MG  --  1.3* 1.8 1.5*   GFR: Estimated Creatinine Clearance: 63.5 mL/min (by C-G formula based on SCr of 0.75 mg/dL). Liver Function Tests: No results for input(s): AST, ALT, ALKPHOS, BILITOT, PROT, ALBUMIN in the last 168 hours. No results for input(s): LIPASE, AMYLASE in the last 168 hours. No results for input(s): AMMONIA in the last 168 hours. Coagulation Profile: Recent Labs  Lab 07/05/21 1404 07/06/21 0402 07/07/21 0409 07/08/21 0401  INR >10.0* 1.5* 1.4* 1.4*   Cardiac Enzymes: No results for input(s): CKTOTAL, CKMB, CKMBINDEX, TROPONINI in the last 168 hours. BNP (last 3 results) No results for input(s): PROBNP in the last 8760 hours. HbA1C: No results for input(s): HGBA1C in the last 72  hours. CBG: Recent Labs  Lab 07/06/21 2014 07/07/21 0732 07/07/21 1259 07/07/21 1755 07/08/21 0737  GLUCAP 238* 101* 134* 181* 94   Lipid Profile: No results for input(s): CHOL, HDL, LDLCALC, TRIG, CHOLHDL, LDLDIRECT in the last 72 hours. Thyroid Function Tests: No results for input(s): TSH, T4TOTAL, FREET4, T3FREE, THYROIDAB in the last 72 hours. Anemia Panel: Recent Labs    07/05/21 1405  VITAMINB12 169*  FOLATE 9.8  FERRITIN 5*  TIBC 353  IRON 47   Urine analysis:    Component Value Date/Time   COLORURINE AMBER (A) 08/09/2019 1838   APPEARANCEUR HAZY (A) 08/09/2019 1838   LABSPEC 1.036 (H) 08/09/2019 1838   PHURINE 5.0 08/09/2019 1838   GLUCOSEU NEGATIVE 08/09/2019 1838   HGBUR NEGATIVE 08/09/2019 1838   BILIRUBINUR SMALL (A) 08/09/2019 1838   KETONESUR 5 (A) 08/09/2019 1838   PROTEINUR 30 (A) 08/09/2019 1838   NITRITE NEGATIVE 08/09/2019 1838   LEUKOCYTESUR MODERATE (A) 08/09/2019 1838    Recent Results (from the past 240 hour(s))  Resp Panel by RT-PCR (Flu A&B, Covid) Nasopharyngeal Swab     Status: None   Collection Time: 07/05/21  8:55 AM   Specimen: Nasopharyngeal Swab; Nasopharyngeal(NP) swabs in vial transport medium  Result Value Ref Range Status   SARS Coronavirus 2 by RT PCR NEGATIVE NEGATIVE Final    Comment: (NOTE) SARS-CoV-2 target nucleic acids are NOT DETECTED.  The SARS-CoV-2 RNA is generally detectable in upper respiratory specimens during the acute phase of infection. The lowest concentration of SARS-CoV-2 viral copies this assay can detect is 138 copies/mL. A negative result does not preclude SARS-Cov-2 infection and should not be used as the sole basis for treatment or other patient management decisions. A negative result may occur with  improper specimen collection/handling, submission of specimen other than nasopharyngeal swab, presence of viral mutation(s) within the areas targeted by this assay, and inadequate number of  viral copies(<138 copies/mL). A negative result must be combined with clinical observations, patient history, and epidemiological information. The expected result is Negative.  Fact Sheet for Patients:  07/07/21  Fact Sheet for Healthcare Providers:  BloggerCourse.com  This test is no t yet approved or cleared by the [place] FDA and  has been authorized for detection and/or diagnosis of SARS-CoV-2 by FDA under an Emergency Use Authorization (EUA). This EUA will remain  in effect (meaning this test can be used) for the duration of the COVID-19 declaration under Section 564(b)(1) of the Act, 21 U.S.C.section 360bbb-3(b)(1), unless the authorization is terminated  or revoked sooner.  Influenza A by PCR NEGATIVE NEGATIVE Final   Influenza B by PCR NEGATIVE NEGATIVE Final    Comment: (NOTE) The Xpert Xpress SARS-CoV-2/FLU/RSV plus assay is intended as an aid in the diagnosis of influenza from Nasopharyngeal swab specimens and should not be used as a sole basis for treatment. Nasal washings and aspirates are unacceptable for Xpert Xpress SARS-CoV-2/FLU/RSV testing.  Fact Sheet for Patients: BloggerCourse.com  Fact Sheet for Healthcare Providers: SeriousBroker.it  This test is not yet approved or cleared by the [place] FDA and has been authorized for detection and/or diagnosis of SARS-CoV-2 by FDA under an Emergency Use Authorization (EUA). This EUA will remain in effect (meaning this test can be used) for the duration of the COVID-19 declaration under Section 564(b)(1) of the Act, 21 U.S.C. section 360bbb-3(b)(1), unless the authorization is terminated or revoked.  Performed at [facility], [address], [place], [place] [address]      Radiology Studies: DG CHEST PORT 1 VIEW  Result Date: 07/07/2021 CLINICAL DATA:  Tachycardia. EXAM: PORTABLE CHEST  1 VIEW COMPARISON:  July 05, 2021. FINDINGS: The heart size and mediastinal contours are within normal limits. Status post transcatheter aortic valve replacement. Both lungs are clear. The visualized skeletal structures are unremarkable. IMPRESSION: No active disease. Aortic Atherosclerosis (ICD10-I70.0). Electronically Signed   By: Lupita Raider M.D.   On: 07/07/2021 14:10     Scheduled Meds:  sodium chloride   Intravenous Once   atorvastatin  20 mg Oral QPM   donepezil  10 mg Oral Daily   feeding supplement  1 Container Oral TID BM   insulin aspart  0-9 Units Subcutaneous TID WC   isosorbide mononitrate  30 mg Oral Daily   memantine  10 mg Oral BID   pantoprazole (PROTONIX) IV  40 mg Intravenous Q12H   QUEtiapine  25 mg Oral QHS   traZODone  150 mg Oral QHS   Continuous Infusions:  sodium chloride 50 mL/hr at 07/08/21 0858   magnesium sulfate bolus IVPB 4 g (07/08/21 0901)   potassium chloride      LOS: 2 days   Time spent: 30 minutes  Elige Ko, Medical student [facility] Pager [phone]  If 7PM-7AM, please contact night-coverage [email or web address] Password [facility] 07/08/2021, 10:49 AM   ATTENDING NOTE  Patient seen and examined with Elige Ko, Medical student. In addition to supervising the encounter, I played a key role in the decision making process as well as reviewed key findings.  Rodney Langton, MD [facility] How to contact the [facility] Attending or Consulting provider 7A - 7P or covering provider during after hours 7P -7A, for this patient?  Check the care team in [facility] and look for a) attending/consulting [facility] provider listed and b) the [facility] team listed Log into [email or web address] and use [place]'s universal password to access. If you do not have the password, please contact the hospital operator. Locate the [facility] provider you are looking for under [facility] and page to a number that you can be directly reached. If you still have difficulty reaching  the provider, please page the [facility] (Director on Call) for the Hospitalists listed on amion for assistance.

## 2021-07-08 NOTE — Op Note (Signed)
[facility] Patient Name: Kathleen Barnett Procedure Date: 07/08/2021 2:00 PM MRN: [id number] Date of Birth: December 14, 1941 Attending MD: Katrinka Blazing ,  CSN: [id number] Age: 80 Admit Type: Inpatient Procedure:                Upper GI endoscopy Indications:              Melena Providers:                Katrinka Blazing, Sheran Fava, Jannett Celestine,                            RN, Durwin Glaze Tech, Technician, Judeth Cornfield.                            Jessee Avers, Technician Referring MD:              Medicines:                Monitored Anesthesia Care Complications:            No immediate complications. Estimated Blood Loss:     Estimated blood loss: none. Procedure:                Pre-Anesthesia Assessment:                           - Prior to the procedure, a History and Physical                            was performed, and patient medications, allergies                            and sensitivities were reviewed. The patient's                            tolerance of previous anesthesia was reviewed.                           - The risks and benefits of the procedure and the                            sedation options and risks were discussed with the                            patient. All questions were answered and informed                            consent was obtained.                           After obtaining informed consent, the endoscope was                            passed under direct vision. Throughout the                            procedure, the patient's blood pressure, pulse, and  oxygen saturations were monitored continuously. The                            [id number]-[id number] ([id number]) scope was introduced through the                            mouth, and advanced to the fourth part of duodenum.                            The upper GI endoscopy was accomplished without                            difficulty. The patient tolerated the procedure                             well. Scope In: 2:15:27 PM Scope Out: 2:20:48 PM Total Procedure Duration: 0 hours 5 minutes 21 seconds  Findings:      The examined esophagus was normal.      A 2 cm hiatal hernia was present.      Three non-bleeding cratered gastric ulcers with a clean ulcer base       (Forrest Class III) were found in the gastric antrum. The largest lesion       was 10 mm in largest dimension. Biopsies were taken with a cold forceps       for Helicobacter pylori testing.      Localized moderate inflammation characterized by congestion (edema) and       erythema was found in the first portion of the duodenum. Impression:               - Normal esophagus.                           - 2 cm hiatal hernia.                           - Non-bleeding gastric ulcers with a clean ulcer                            base (Forrest Class III). Biopsied.                           - Duodenitis. Moderate Sedation:      Per Anesthesia Care Recommendation:           - Return patient to hospital ward for ongoing care.                           - Resume regular diet.                           - Await pathology results.                           - Continue pantoprazole 40 mg twice a day for next  3 months.                           - No ibuprofen, naproxen, or other non-steroidal                            anti-inflammatory drugs.                           - Repeat upper endoscopy in 3 months for                            surveillance.                           - Follow up in gastroenterology clinic in 4 weeks. Procedure Code(s):        --- Professional ---                           [phone], Esophagogastroduodenoscopy, flexible,                            transoral; with biopsy, single or multiple Diagnosis Code(s):        --- Professional ---                           K44.9, Diaphragmatic hernia without obstruction or                            gangrene                            K25.9, Gastric ulcer, unspecified as acute or                            chronic, without hemorrhage or perforation                           K29.80, Duodenitis without bleeding                           K92.1, Melena (includes Hematochezia) CPT copyright 2019 American Medical Association. All rights reserved. The codes documented in this report are preliminary and upon coder review may  be revised to meet current compliance requirements. Katrinka Blazing, MD Katrinka Blazing,  07/08/2021 2:31:10 PM This report has been signed electronically. Number of Addenda: 0

## 2021-07-08 NOTE — Anesthesia Preprocedure Evaluation (Signed)
Anesthesia Evaluation  Patient identified by MRN, date of birth, ID band Patient awake    Reviewed: Allergy & Precautions, NPO status , Patient's Chart, lab work & pertinent test results  History of Anesthesia Complications Negative for: history of anesthetic complications  Airway Mallampati: II  TM Distance: >3 FB Neck ROM: Full    Dental no notable dental hx. (+) Upper Dentures, Edentulous Lower   Pulmonary neg pulmonary ROS, Current Smoker and Patient abstained from smoking.,    Pulmonary exam normal breath sounds clear to auscultation       Cardiovascular Exercise Tolerance: Poor + angina + dysrhythmias Atrial Fibrillation I+ Valvular Problems/Murmurs (valve replacement 2016)  Rhythm:Irregular Rate:Normal  On Isosorbide and kept anticoag with coumadin -unaware why  Denies recent CP/DOE As per ED visit note, she had stent placement/valve procedure in [place]   Neuro/Psych Dementia On meds  Knew person ,place,date  Thought Trump is president negative neurological ROS  negative psych ROS   GI/Hepatic Neg liver ROS, GERD  ,  Endo/Other  diabetes, Well Controlled, Type 2, Oral Hypoglycemic Agents  Renal/GU negative Renal ROS  negative genitourinary   Musculoskeletal negative musculoskeletal ROS (+)   Abdominal   Peds negative pediatric ROS (+)  Hematology  (+) anemia ,   Anesthesia Other Findings Hypokalemia,  Possible Vasovagal episode yesterday   Reproductive/Obstetrics negative OB ROS                             Anesthesia Physical  Anesthesia Plan  ASA: 4  Anesthesia Plan: General   Post-op Pain Management:    Induction: Intravenous  PONV Risk Score and Plan: 1 and TIVA  Airway Management Planned: Nasal Cannula and Simple Face Mask  Additional Equipment:   Intra-op Plan:   Post-operative Plan:   Informed Consent: I have reviewed the patients History and Physical,  chart, labs and discussed the procedure including the risks, benefits and alternatives for the proposed anesthesia with the patient or authorized representative who has indicated his/her understanding and acceptance.    Discussed DNR with power of attorney and Suspend DNR.     Plan Discussed with: CRNA and Surgeon  Anesthesia Plan Comments:         Anesthesia Quick Evaluation

## 2021-07-08 NOTE — Progress Notes (Signed)
Physical Therapy Treatment Patient Details Name: Kathleen Barnett MRN: [id number] DOB: [birth year] Today's Date: 07/08/2021    History of Present Illness Kathleen Barnett is a 80 y.o. 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  Patient has had some near syncopal episodes of intermittently feeling dizzy.  She has had some intermittent chest discomfort with exertion lasting about 5 to 10 minutes.  She states that it improves when she rests.  She denies any worsening peripheral edema, orthopnea, PND.  She denies any fevers, chills, headache, neck pain, coughing, hemoptysis.  The patient is unable to find any further history regarding her heart valve.  She denies any abdominal pain, but states that she has been having some occasional melanotic stool over the past 2 to 3 weeks.  She denies any frank hematochezia, dysuria, hematuria.  She denies any visual disturbance or focal extremity weakness.    PT Comments    Patient transitions to EOB without physical assist and demonstrates good sitting balance and sitting tolerance. Patient completes seated exercises EOB following demonstration. She transfers to standing with RW and is given cueing for sequencing and proper RW use. She ambulates increased distance with RW and slightly unsteady cadence. Patient left seated in chair at end of session - RN notified. Patient will benefit from continued physical therapy in hospital and recommended venue below to increase strength, balance, endurance for safe ADLs and gait.    Follow Up Recommendations  Home health PT;Supervision - Intermittent     Equipment Recommendations  Standard walker    Recommendations for Other Services       Precautions / Restrictions  Precautions Precautions: Fall Restrictions Weight Bearing Restrictions: No    Mobility  Bed Mobility Overal bed mobility: Modified Independent                  Transfers Overall transfer level: Modified independent Equipment used: Rolling walker (2 wheeled)             General transfer comment: transfer with RW, some cueing for sequencing and proper RW use  Ambulation/Gait Ambulation/Gait assistance: Min guard Gait Distance (Feet): 100 Feet Assistive device: Rolling walker (2 wheeled) Gait Pattern/deviations: Trunk flexed Gait velocity: decreased   General Gait Details: slightly slow, labored, min unsteadiness   Stairs             Wheelchair Mobility    Modified Rankin (Stroke Patients Only)       Balance Overall balance assessment: Needs assistance Sitting-balance support: Feet supported Sitting balance-Leahy Scale: Good Sitting balance - Comments: seated EOB   Standing balance support: Bilateral upper extremity supported Standing balance-Leahy Scale: Fair Standing balance comment: with RW                            Cognition Arousal/Alertness: Awake/alert Behavior During Therapy: WFL for tasks assessed/performed Overall Cognitive Status: Within Functional Limits for tasks assessed                                        Exercises General Exercises - Lower Extremity Long Arc Quad: AROM;Both;10 reps;Seated Hip Flexion/Marching: AROM;Both;10 reps;Seated Toe Raises: AROM;Both;10 reps;Seated Heel Raises: AROM;Both;10 reps;Seated  General Comments        Pertinent Vitals/Pain Pain Assessment: No/denies pain    Home Living                      Prior Function            PT Goals (current goals can now be found in the care plan section) Acute Rehab PT Goals Patient Stated Goal: to go home PT Goal Formulation: With patient Time For Goal Achievement: 07/20/21 Potential to Achieve Goals:  Good Progress towards PT goals: Progressing toward goals    Frequency    Min 3X/week      PT Plan Current plan remains appropriate    Co-evaluation              AM-PAC PT "6 Clicks" Mobility   Outcome Measure  Help needed turning from your back to your side while in a flat bed without using bedrails?: None Help needed moving from lying on your back to sitting on the side of a flat bed without using bedrails?: None Help needed moving to and from a bed to a chair (including a wheelchair)?: A Little Help needed standing up from a chair using your arms (e.g., wheelchair or bedside chair)?: None Help needed to walk in hospital room?: A Little Help needed climbing 3-5 steps with a railing? : A Little 6 Click Score: 21    End of Session Equipment Utilized During Treatment: Gait belt Activity Tolerance: Patient tolerated treatment well Patient left: with call bell/phone within reach;in chair Nurse Communication: Mobility status PT Visit Diagnosis: Unsteadiness on feet (R26.81);Muscle weakness (generalized) (M62.81);Difficulty in walking, not elsewhere classified (R26.2)     Time: 5436-0677 PT Time Calculation (min) (ACUTE ONLY): 21 min  Charges:  $Therapeutic Exercise: 8-22 mins                    1:36 PM, 07/08/21 Wyman Songster PT, DPT Physical Therapist at [facility]

## 2021-07-08 NOTE — Progress Notes (Signed)
We will proceed with EGD as scheduled.  I thoroughly discussed with the patient his procedure, including the risks involved. Patient understands what the procedure involves including the benefits and any risks. Patient understands alternatives to the proposed procedure. Risks including (but not limited to) bleeding, tearing of the lining (perforation), rupture of adjacent organs, problems with heart and lung function, infection, and medication reactions. A small percentage of complications may require surgery, hospitalization, repeat endoscopic procedure, and/or transfusion.  Patient understood and agreed.  Elloise Roark Castaneda, MD Gastroenterology and Hepatology [facility]  

## 2021-07-08 NOTE — Brief Op Note (Signed)
07/05/2021 - 07/08/2021  2:32 PM  PATIENT:  Kathleen Barnett  80 y.o. female  PRE-OPERATIVE DIAGNOSIS:  symptomatic anemia, melena,  POST-OPERATIVE DIAGNOSIS:  gastric erosions, 3 gastric ulcer, hiatal hernia, duodenitis  PROCEDURE:  Procedure(s): ESOPHAGOGASTRODUODENOSCOPY (EGD) WITH PROPOFOL (N/A) BIOPSY  SURGEON:  Surgeon(s) and Role:    * Dolores Frame, MD - Primary  Patient underwent EGD under propofol sedation.  Tolerated the procedure adequately.  Was found to have a hiatal hernia.  Stomach had presence of 3 superficial cratered ulcers in the antrum without any active bleeding or stigmata of recent bleeding.  There was presence of associated erosions.  Biopsies were obtained to rule out H. pylori.  First portion of the duodenum showed presence of inflammation characterized by erythema and edema without ulcers.  No hematin were seen by recent bleeding was seen in the duodenal lumen.  Scope was advanced up to the fourth portion of the duodenum.  RECOMMENDATIONS - Return patient to hospital ward for ongoing care.  - Resume regular diet.  - Await pathology results.  - Continue pantoprazole 40 mg twice a day for next 3 months. - No ibuprofen, naproxen, or other non-steroidal anti-inflammatory drugs.  - Repeat upper endoscopy in 3 months for surveillance.  - Tight control of anticoagulation. - Follow up in gastroenterology clinic in 4 weeks. - GI service will sign-off, please call us back if you have any more questions.  Katrinka Blazing, MD Gastroenterology and Hepatology [facility]

## 2021-07-08 NOTE — Progress Notes (Signed)
ANTICOAGULATION CONSULT NOTE -   Pharmacy Consult for warfarin Indication: atrial fibrillation  No Known Allergies  Patient Measurements: Height: 5\' 7"  (170.2 cm) Weight: 81 kg (178 lb 9.2 oz) IBW/kg (Calculated) : 61.6 Heparin Dosing Weight:   Vital Signs: Temp: 98 F (36.7 C) (07/22 0419) Temp Source: Oral (07/22 0419) BP: 103/61 (07/22 0419) Pulse Rate: 80 (07/22 0419)  Labs: Recent Labs    07/06/21 0402 07/07/21 0409 07/08/21 0401  HGB 8.6* 9.4* 8.3*  HCT 25.2* 27.7* 25.0*  PLT 176 166 170  LABPROT 18.5* 16.8* 17.6*  INR 1.5* 1.4* 1.4*  CREATININE 0.83 0.76 0.75     Estimated Creatinine Clearance: 63.5 mL/min (by C-G formula based on SCr of 0.75 mg/dL).   Medical History: Past Medical History:  Diagnosis Date   Diabetes mellitus without complication (HCC)     Medications:  Medications Prior to Admission  Medication Sig Dispense Refill Last Dose   albuterol (VENTOLIN HFA) 108 (90 Base) MCG/ACT inhaler Inhale 2 puffs into the lungs every 6 (six) hours as needed for wheezing or shortness of breath.    PRN   atorvastatin (LIPITOR) 20 MG tablet Take 20 mg by mouth every evening.    07/04/2021   donepezil (ARICEPT) 10 MG tablet Take 10 mg by mouth daily.   07/05/2021   ibuprofen (ADVIL) 200 MG tablet Take 400 mg by mouth daily as needed for moderate pain.   PRN   isosorbide mononitrate (IMDUR) 30 MG 24 hr tablet Take 30 mg by mouth daily.    07/05/2021   memantine (NAMENDA) 10 MG tablet Take 10 mg by mouth 2 (two) times daily.   07/05/2021   metFORMIN (GLUCOPHAGE) 500 MG tablet Take 500 mg by mouth 2 (two) times daily with a meal.   07/05/2021   naproxen (NAPROSYN) 500 MG tablet Take 500 mg by mouth 2 (two) times daily.   07/05/2021   omeprazole (PRILOSEC) 40 MG capsule Take 40 mg by mouth daily.   07/05/2021   QUEtiapine (SEROQUEL) 25 MG tablet Take 25 mg by mouth at bedtime.   Past Week   traMADol (ULTRAM) 50 MG tablet Take 50 mg by mouth daily as needed (pain).     Past Week   traZODone (DESYREL) 150 MG tablet Take 300 mg by mouth at bedtime.    07/04/2021   warfarin (COUMADIN) 5 MG tablet Take 7.5 mg by mouth daily.   07/05/2021 at 0630    Assessment: Pharmacy consulted to dose warfarin in patient with atrial fibrillation.  Patient's INR on admission > 10 and received 5 mg IV Vitamin K.  INR today is 1.5. Home dose listed as 7.5 mg daily and last dose 7/10 0630.  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 FOBT +  Pt for EDG today, holding until post EGD and recommendations  Goal of Therapy:  INR 2-3 Monitor platelets by anticoagulation protocol: Yes   Plan:  Hold warfarin today. Follow-up GI and cards recommendations on restart.  07/07/2021, BS Elder Cyphers, Loura Back Clinical Pharmacist Pager [phone]  07/08/2021 11:27 AM

## 2021-07-08 NOTE — Transfer of Care (Signed)
Immediate Anesthesia Transfer of Care Note  Patient: Kathleen Barnett  Procedure(s) Performed: ESOPHAGOGASTRODUODENOSCOPY (EGD) WITH PROPOFOL BIOPSY  Patient Location: PACU  Anesthesia Type:General  Level of Consciousness: awake and patient cooperative  Airway & Oxygen Therapy: Patient Spontanous Breathing  Post-op Assessment: Report given to RN and Post -op Vital signs reviewed and stable  Post vital signs: Reviewed and stable  Last Vitals:  Vitals Value Taken Time  BP    Temp    Pulse    Resp    SpO2    SEE  VITAL SIGN FLOW SHEET  Last Pain:  Vitals:   07/08/21 1411  TempSrc:   PainSc: 0-No pain      Patients Stated Pain Goal: 8 (07/08/21 1321)  Complications: No notable events documented.

## 2021-07-09 DIAGNOSIS — K922 Gastrointestinal hemorrhage, unspecified: Secondary | ICD-10-CM

## 2021-07-09 LAB — PROTIME-INR
INR: 1.4 — ABNORMAL HIGH (ref 0.8–1.2)
Prothrombin Time: 17.4 seconds — ABNORMAL HIGH (ref 11.4–15.2)

## 2021-07-09 LAB — CBC
HCT: 23.1 % — ABNORMAL LOW (ref 36.0–46.0)
Hemoglobin: 7.5 g/dL — ABNORMAL LOW (ref 12.0–15.0)
MCH: 29.2 pg (ref 26.0–34.0)
MCHC: 32.5 g/dL (ref 30.0–36.0)
MCV: 89.9 fL (ref 80.0–100.0)
Platelets: 150 10*3/uL (ref 150–400)
RBC: 2.57 MIL/uL — ABNORMAL LOW (ref 3.87–5.11)
RDW: 14.6 % (ref 11.5–15.5)
WBC: 5.5 10*3/uL (ref 4.0–10.5)
nRBC: 0 % (ref 0.0–0.2)

## 2021-07-09 LAB — BASIC METABOLIC PANEL
Anion gap: 4 — ABNORMAL LOW (ref 5–15)
BUN: 8 mg/dL (ref 8–23)
CO2: 28 mmol/L (ref 22–32)
Calcium: 8.2 mg/dL — ABNORMAL LOW (ref 8.9–10.3)
Chloride: 108 mmol/L (ref 98–111)
Creatinine, Ser: 0.7 mg/dL (ref 0.44–1.00)
GFR, Estimated: >60 mL/min (ref >60)
Glucose, Bld: 103 mg/dL — ABNORMAL HIGH (ref 70–99)
Potassium: 3.2 mmol/L — ABNORMAL LOW (ref 3.5–5.1)
Sodium: 140 mmol/L (ref 135–145)

## 2021-07-09 LAB — GLUCOSE, CAPILLARY
Glucose-Capillary: 105 mg/dL — ABNORMAL HIGH (ref 70–99)
Glucose-Capillary: 129 mg/dL — ABNORMAL HIGH (ref 70–99)

## 2021-07-09 LAB — MAGNESIUM: Magnesium: 1.7 mg/dL (ref 1.7–2.4)

## 2021-07-09 MED ORDER — WARFARIN SODIUM 7.5 MG PO TABS: 7.5000 mg | ORAL_TABLET | Freq: Once | ORAL | Status: DC

## 2021-07-09 MED ORDER — FERROUS SULFATE 325 (65 FE) MG PO TABS: 325.0000 mg | ORAL_TABLET | Freq: Every day | ORAL | 2 refills | Status: AC

## 2021-07-09 MED ORDER — DOCUSATE SODIUM 100 MG PO CAPS: 100.0000 mg | ORAL_CAPSULE | Freq: Two times a day (BID) | ORAL | 2 refills | Status: AC

## 2021-07-09 MED ORDER — QUETIAPINE FUMARATE 25 MG PO TABS: 25.0000 mg | ORAL_TABLET | Freq: Every day | ORAL | 0 refills | Status: AC

## 2021-07-09 MED ORDER — TRAZODONE HCL 150 MG PO TABS: 150.0000 mg | ORAL_TABLET | Freq: Every day | ORAL | Status: AC

## 2021-07-09 MED ORDER — POTASSIUM CHLORIDE CRYS ER 20 MEQ PO TBCR
40.0000 meq | EXTENDED_RELEASE_TABLET | Freq: Once | ORAL | Status: AC
  Administered 2021-07-09: 40 meq via ORAL
  Filled 2021-07-09: qty 2

## 2021-07-09 MED ORDER — WARFARIN - PHARMACIST DOSING INPATIENT: Freq: Every day | Status: DC

## 2021-07-09 MED ORDER — OMEPRAZOLE 40 MG PO CPDR: 40.0000 mg | DELAYED_RELEASE_CAPSULE | Freq: Two times a day (BID) | ORAL | 3 refills | Status: AC

## 2021-07-09 MED ORDER — WARFARIN SODIUM 5 MG PO TABS: 5.0000 mg | ORAL_TABLET | Freq: Every day | ORAL | 0 refills | Status: DC

## 2021-07-09 NOTE — TOC Transition Note (Signed)
Transition of Care [facility]) - CM/SW Discharge Note   Patient Details  Name: Kathleen Barnett MRN: [id number] Date of Birth: 1941-07-06  [facility] of [facility]) CM/SW Contact:  Barry Brunner, LCSW Phone Number: 07/09/2021, 1:44 PM   Clinical Narrative:    CSW notified of patient's readiness for discharge. CSW notified Barbara Cower with [facility]. Barbara Cower agreeable to provide [facility] services upon discharge. TOC signing off.   Final next level of care: Home w Home Health Services Barriers to Discharge: Barriers Resolved   Patient Goals and CMS Choice Patient states their goals for this hospitalization and ongoing recovery are:: Return home with [facility]. CMS Medicare.gov Compare Post Acute Care list provided to:: Patient Choice offered to / list presented to : Patient  Discharge Placement                    Patient and family notified of of transfer: 07/09/21  Discharge Plan and Services                DME Arranged: Dan Humphreys DME Agency: [facility] Date DME Agency Contacted: 07/06/21 Time DME Agency Contacted: [phone] Representative spoke with at DME Agency: Velna Hatchet HH Arranged: RN, PT [facility] Agency: [facility] Date HH Agency Contacted: 07/09/21 Time HH Agency Contacted: [phone] Representative spoke with at [facility] Agency: Barbara Cower  Social Determinants of Health (SDOH) Interventions     Readmission Risk Interventions No flowsheet data found.

## 2021-07-09 NOTE — Discharge Instructions (Signed)
HOME HEALTH RN TO CHECK PT/INR ON 7/25 AND 7/27 AND SEND RESULTS TO PCP REFERRAL MADE TO [facility]  PLEASE FOLLOW UP WITH GI IN 4 WEEKS AVOID ALL NSAID MEDICATIONS     IMPORTANT INFORMATION: PAY CLOSE ATTENTION   PHYSICIAN DISCHARGE INSTRUCTIONS  Follow with Primary care provider  Marylynn Pearson, FNP  and other consultants as instructed by your Hospitalist Physician  SEEK MEDICAL CARE OR RETURN TO EMERGENCY ROOM IF SYMPTOMS COME BACK, WORSEN OR NEW PROBLEM DEVELOPS   Please note: You were cared for by a hospitalist during your hospital stay. Every effort will be made to forward records to your primary care provider.  You can request that your primary care provider send for your hospital records if they have not received them.  Once you are discharged, your primary care physician will handle any further medical issues. Please note that NO REFILLS for any discharge medications will be authorized once you are discharged, as it is imperative that you return to your primary care physician (or establish a relationship with a primary care physician if you do not have one) for your post hospital discharge needs so that they can reassess your need for medications and monitor your lab values.  Please get a complete blood count and chemistry panel checked by your Primary MD at your next visit, and again as instructed by your Primary MD.  Get Medicines reviewed and adjusted: Please take all your medications with you for your next visit with your Primary MD  Laboratory/radiological data: Please request your Primary MD to go over all hospital tests and procedure/radiological results at the follow up, please ask your primary care provider to get all Hospital records sent to his/her office.  In some cases, they will be blood work, cultures and biopsy results pending at the time of your discharge. Please request that your primary care provider follow up on these results.  If you  are diabetic, please bring your blood sugar readings with you to your follow up appointment with primary care.    Please call and make your follow up appointments as soon as possible.    Also Note the following: If you experience worsening of your admission symptoms, develop shortness of breath, life threatening emergency, suicidal or homicidal thoughts you must seek medical attention immediately by calling 911 or calling your MD immediately  if symptoms less severe.  You must read complete instructions/literature along with all the possible adverse reactions/side effects for all the Medicines you take and that have been prescribed to you. Take any new Medicines after you have completely understood and accpet all the possible adverse reactions/side effects.   Do not drive when taking Pain medications or sleeping medications (Benzodiazepines)  Do not take more than prescribed Pain, Sleep and Anxiety Medications. It is not advisable to combine anxiety,sleep and pain medications without talking with your primary care practitioner  Special Instructions: If you have smoked or chewed Tobacco  in the last 2 yrs please stop smoking, stop any regular Alcohol  and or any Recreational drug use.  Wear Seat belts while driving.  Do not drive if taking any narcotic, mind altering or controlled substances or recreational drugs or alcohol.

## 2021-07-09 NOTE — Discharge Summary (Signed)
Physician Discharge Summary  KNOX HOLDMAN XBJ:[id number] DOB: Mar 31, 1941 DOA: 07/05/2021  PCP: Marylynn Pearson, FNP GI: Dr. Levon Hedger   Admit date: 07/05/2021 Discharge date: 07/09/2021  Admitted From:  Home  Disposition: Home with [facility]  Recommendations for Outpatient Follow-up:  Follow up with PCP in 1 weeks Follow up with GI in 4 weeks  Follow up with cardiology as scheduled on 8/16 Referral made to anticoagulation clinic [facility] RN to check PT/INR on 7/25 and 7/27 and send results to PCP and cardiology Please obtain BMP and CBC in 1 week to follow up hemoglobin and electrolytes Please follow up on the following pending results: EGD biopsy results  Home Health: PT, RN:  RN to check PT/INR on 7/25, 7/27 and send results to PCP and cardiology  Discharge Condition: STABLE   CODE STATUS: FULL DIET:  soft foods   Brief Hospitalization Summary: Please see all hospital notes, images, labs for full details of the hospitalization. ADMISSION HPI:  Kathleen Barnett is a 80 y.o. 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  Patient has had some near syncopal episodes of intermittently feeling dizzy.  She has had some intermittent chest discomfort with exertion lasting about 5 to 10 minutes.  She states that it improves when she rests.  She denies any worsening peripheral edema, orthopnea, PND.  She denies any fevers, chills, headache, neck pain, coughing, hemoptysis.  The patient is unable to find any further history regarding her heart valve.  She denies any abdominal pain, but states that she has been having some occasional melanotic stool over the past 2 to 3 weeks.  She denies any frank hematochezia, dysuria, hematuria.  She denies any visual disturbance or focal  extremity weakness.  In the emergency department, the patient was afebrile and hemodynamically stable with oxygen saturation 98% on room air.  BMP showed a sodium of 134, potassium 2.9, serum creatinine 1.00.  WBC 7.4, hemoglobin 10.8, platelets 213,000.  D-dimer was 0.31.  BNP was 40.  INR >10. Troponin 11>>11.  Chest x-ray showed mild cardiomegaly without any infiltrates or edema.  FOBT was positive.  CT of the brain was negative for acute findings.  GI was consulted to assist with management.  Cardiology was consulted also for abnormal EKG.    HOSPITAL COURSE BY PROBLEM  Vitamin K and FFP were given with improvement in INR. In speaking w daughter Efraim Kaufmann, patient has been following with [facility] clinic for INR check. Home meds include 7.5mg  Coumadin every day, naproxen 500mg  BID for many years. Daughter is advised to not continue NSAIDs.   7/20 - Patient is doing okay; reports 1 episode of dark stool over night. INR has trended down to 1.5. Cardiology Dr. 8/20 evaluated patient and recommends echocardiology to evaluate for atrial valve replacement. Further attempts at anticoagulation will depend on GI status and review of risk/benefit. GI (Dr. Diona Browner) saw patient, thought patient to have upper GIB of unclear source. Considering endoscopic evaluation. Hold NSAIDS, continue PPI twice daily IV.    7/21 - Patient is awake this morning, awaiting EGD for further evaluation of melena. NPO since midnight. PT evaluated patient, recommending HHPT with rolling walker.    7/22 -  Had vasovagal episode at [facility] yesterday. Canceled EDG. Will reattempt today.  7/23 - EGD completed with findings  of multiple gastric ulcers   Assessment & Plan:   Active Problems:   Symptomatic anemia   Melena   Heme positive stool   Chronic anticoagulation   Hypokalemia   Unspecified atrial fibrillation (HCC)   Iron deficiency anemia due to chronic blood loss   B12 deficiency   Anemia   GI hemorrhage     Melena/symptomatic anemia/FOBT+ -Patient presented with hemoglobin 10.8 > 8.6 > 9.4 > 8.3 -Baseline hemoglobin 12-13 -GI consulted -Continue PPI bid x 3 months per GI recommendation -EGD with findings of several gastric ulcers.  AVOID ALL NSAIDS - Anemia panel consistent with IDA with ferritin 5 - iron infusion  - Pharmacy consulted for recommendations on warfarin dosing.    Coagulopathy -INR>10 > 1.5 > 1.4 stable -Vitamin K 5 mg IV and FFP x 1 unit given 7/19 - resume warfarin on 7/23 at 5 mg daily  - referral to anticoag clinic made - [facility] RN ordered for checking PT/INR on 7/25 and 7/27 and reporting results to PCP and cardiology   Iron-deficiency anemia - likely from poor nutrition as patient has not been eating much in the past 2 weeks (per daughter). Patient has exhibited some weakness.  - Anemia panel showed ferritin 5, iron 47 - s/p IV iron infusion - Hgb stable at 9.4 this AM - DC home with ferrous sulfate 325 mg daily with breakfast - please check CBC in 1 week with PCP   Frequent PACs -Cardiology consult--Dr. Deforest Hoyles reviewed EKG - EKG shows sinus tachycardia (rate 102 bpm) with frequent PACs. There is left axis deviation and low voltage QRS in precordial leads. Qtc 463 ms. - Dr. Diona Browner saw patient 7/20, reviewed telemetry, documenting that it shows sinus rhythm with frequent PACs and bursts of PAT/AF with no evidence of high degree heart block.  -Optimize electrolytes -monitored on telemetry and remained stable  -no pauses noted  -Cardiology continues to follow outpatient , with appt scheduled for 8/16    Hypokalemia/hypomag - K 3.0, Mg 1.5 - Repleted and monitor   Diabetes mellitus type 2 -Holding metformin - BG 90s - 100s -NovoLog sliding scale - A1c 6.5  CBG (last 3)  Recent Labs    07/08/21 1449 07/08/21 1708 07/09/21 0723  GLUCAP 116* 118* 105*   History of heart valve replacement -Daughter could not locate card for details. - continue imdur     Cognitive impairment/dementia -Continue Aricept and Namenda -Continue Seroquel at bedtime   Hyperlipidemia -Continue statin    Hypokalemia/ hypomagnesemia -repleted   Left Breast Rash -chaperone present with Abigail, NT -appears to have small patch of atopic dermatitis under left breast--no signs of infection -prn topical hydrocortisone   DVT prophylaxis: SCD    Code Status: DNR  - Melissa has provided documentation to nurse. Family Communication: daughter Sonia Baller over phone 7/23 Disposition Plan: home with HH, standard walker. Consultants:  Cardiology (Dr. Diona Browner) Gastroenterology (Dr. Levon Hedger)   Discharge Diagnoses:  Active Problems:   Symptomatic anemia   Melena   Heme positive stool   Chronic anticoagulation   Hypokalemia   Unspecified atrial fibrillation (HCC)   Iron deficiency anemia due to chronic blood loss   B12 deficiency   Anemia   GI hemorrhage   Discharge Instructions: Discharge Instructions     Ambulatory referral to Anticoagulation Monitoring   Complete by: As directed    Responsible Group: [facility] [address]      Allergies as of 07/09/2021   No Known Allergies  Medication List     STOP taking these medications    ibuprofen 200 MG tablet Commonly known as: ADVIL   naproxen 500 MG tablet Commonly known as: NAPROSYN       TAKE these medications    albuterol 108 (90 Base) MCG/ACT inhaler Commonly known as: VENTOLIN HFA Inhale 2 puffs into the lungs every 6 (six) hours as needed for wheezing or shortness of breath.   atorvastatin 20 MG tablet Commonly known as: LIPITOR Take 20 mg by mouth every evening.   docusate sodium 100 MG capsule Commonly known as: Colace Take 1 capsule (100 mg total) by mouth 2 (two) times daily.   donepezil 10 MG tablet Commonly known as: ARICEPT Take 10 mg by mouth daily.   ferrous sulfate 325 (65 FE) MG tablet Take 1 tablet (325 mg total) by mouth daily with  breakfast.   isosorbide mononitrate 30 MG 24 hr tablet Commonly known as: IMDUR Take 30 mg by mouth daily.   memantine 10 MG tablet Commonly known as: NAMENDA Take 10 mg by mouth 2 (two) times daily.   metFORMIN 500 MG tablet Commonly known as: GLUCOPHAGE Take 500 mg by mouth 2 (two) times daily with a meal.   omeprazole 40 MG capsule Commonly known as: PRILOSEC Take 1 capsule (40 mg total) by mouth in the morning and at bedtime. What changed: when to take this   QUEtiapine 25 MG tablet Commonly known as: SEROQUEL Take 1 tablet (25 mg total) by mouth at bedtime.   traMADol 50 MG tablet Commonly known as: ULTRAM Take 50 mg by mouth daily as needed (pain).   traZODone 150 MG tablet Commonly known as: DESYREL Take 1 tablet (150 mg total) by mouth at bedtime. What changed: how much to take   warfarin 5 MG tablet Commonly known as: COUMADIN Take 1 tablet (5 mg total) by mouth daily at 4 PM. What changed:  how much to take when to take this               Durable Medical Equipment  (From admission, onward)           Start     Ordered   07/07/21 1327  For home use only DME Walker rolling  Once       Question Answer Comment  Walker: Other   Comments standard   Patient needs a walker to treat with the following condition Gait instability      07/07/21 1326            Follow-up Information     Dyann Kief, PA-C Follow up on 08/02/2021.   Specialty: Cardiology Why: Cardiology Hospital Follow-up on 08/02/2021 at 1:30 PM. Contact information: [address] [place] [address] [phone]         Dolores Frame, MD. Schedule an appointment as soon as possible for a visit in 4 week(s).   Specialty: Gastroenterology Why: Hospital Follow Up Contact information: [address] [place] [address] [phone]         Marylynn Pearson, FNP. Schedule an appointment as soon as possible for a visit in 1 week(s).    Specialty: Family Medicine Why: Hospital Follow Up, check labs Contact information: [address] [place] [address] [phone]                No Known Allergies Allergies as of 07/09/2021   No Known Allergies      Medication List  STOP taking these medications    ibuprofen 200 MG tablet Commonly known as: ADVIL   naproxen 500 MG tablet Commonly known as: NAPROSYN       TAKE these medications    albuterol 108 (90 Base) MCG/ACT inhaler Commonly known as: VENTOLIN HFA Inhale 2 puffs into the lungs every 6 (six) hours as needed for wheezing or shortness of breath.   atorvastatin 20 MG tablet Commonly known as: LIPITOR Take 20 mg by mouth every evening.   docusate sodium 100 MG capsule Commonly known as: Colace Take 1 capsule (100 mg total) by mouth 2 (two) times daily.   donepezil 10 MG tablet Commonly known as: ARICEPT Take 10 mg by mouth daily.   ferrous sulfate 325 (65 FE) MG tablet Take 1 tablet (325 mg total) by mouth daily with breakfast.   isosorbide mononitrate 30 MG 24 hr tablet Commonly known as: IMDUR Take 30 mg by mouth daily.   memantine 10 MG tablet Commonly known as: NAMENDA Take 10 mg by mouth 2 (two) times daily.   metFORMIN 500 MG tablet Commonly known as: GLUCOPHAGE Take 500 mg by mouth 2 (two) times daily with a meal.   omeprazole 40 MG capsule Commonly known as: PRILOSEC Take 1 capsule (40 mg total) by mouth in the morning and at bedtime. What changed: when to take this   QUEtiapine 25 MG tablet Commonly known as: SEROQUEL Take 1 tablet (25 mg total) by mouth at bedtime.   traMADol 50 MG tablet Commonly known as: ULTRAM Take 50 mg by mouth daily as needed (pain).   traZODone 150 MG tablet Commonly known as: DESYREL Take 1 tablet (150 mg total) by mouth at bedtime. What changed: how much to take   warfarin 5 MG tablet Commonly known as: COUMADIN Take 1 tablet (5 mg total) by mouth daily at  4 PM. What changed:  how much to take when to take this               Durable Medical Equipment  (From admission, onward)           Start     Ordered   07/07/21 1327  For home use only DME Walker rolling  Once       Question Answer Comment  Walker: Other   Comments standard   Patient needs a walker to treat with the following condition Gait instability      07/07/21 1326            Procedures/Studies: EGD: 07/08/21 PRE-OPERATIVE DIAGNOSIS:  symptomatic anemia, melena,   POST-OPERATIVE DIAGNOSIS:  gastric erosions, 3 gastric ulcer, hiatal hernia, duodenitis   PROCEDURE:  Procedure(s): ESOPHAGOGASTRODUODENOSCOPY (EGD) WITH PROPOFOL (N/A) BIOPSY   SURGEON:  Surgeon(s) and Role:    * Dolores Frameastaneda Mayorga, Daniel, MD - Primary   Patient underwent EGD under propofol sedation.  Tolerated the procedure adequately.  Was found to have a hiatal hernia.  Stomach had presence of 3 superficial cratered ulcers in the antrum without any active bleeding or stigmata of recent bleeding.  There was presence of associated erosions.  Biopsies were obtained to rule out H. pylori.  First portion of the duodenum showed presence of inflammation characterized by erythema and edema without ulcers.  No hematin were seen by recent bleeding was seen in the duodenal lumen.  Scope was advanced up to the fourth portion of the duodenum.   RECOMMENDATIONS - Return patient to hospital ward for ongoing care. - Resume regular diet. - Await pathology  results. - Continue pantoprazole 40 mg twice a day for next 3 months. - No ibuprofen, naproxen, or other non-steroidal anti-inflammatory drugs. - Repeat upper endoscopy in 3 months for surveillance.  - Tight control of anticoagulation. - Follow up in gastroenterology clinic in 4 weeks. - GI service will sign-off, please call us back if you have any more questions.   Katrinka Blazing, MD Gastroenterology and Hepatology [facility]   CT Head Wo Contrast  Result Date: 07/05/2021 CLINICAL DATA:  Neuro deficit, acute, stroke suspected. Additional history provided: Patient reports increased weakness for 2 weeks. EXAM: CT HEAD WITHOUT CONTRAST TECHNIQUE: Contiguous axial images were obtained from the base of the skull through the vertex without intravenous contrast. COMPARISON:  Brain MRI 09/04/2019. FINDINGS: Brain: Mild-to-moderate generalized cerebral atrophy. 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 Mild-to-moderate patchy and ill-defined hypoattenuation within the cerebral white matter, nonspecific but compatible chronic small vessel ischemic disease. There is no acute intracranial hemorrhage. No demarcated cortical infarct. No extra-axial fluid collection. No evidence of an intracranial mass. No midline shift. Vascular: No hyperdense vessel.  Atherosclerotic calcifications Skull: Normal. Negative for fracture or focal lesion. Sinuses/Orbits: Visualized orbits show no acute finding. No significant paranasal sinus disease at the imaged levels. 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 Electronically Signed   By: Jackey Loge DO   On: 07/05/2021 09:38   DG CHEST PORT 1 VIEW  Result Date: 07/07/2021 CLINICAL DATA:  Tachycardia. EXAM: PORTABLE CHEST 1 VIEW COMPARISON:  July 05, 2021. FINDINGS: The heart size and mediastinal contours are within normal limits. Status post transcatheter aortic valve replacement. Both lungs are clear. The visualized skeletal structures are unremarkable. IMPRESSION: No active disease. Aortic Atherosclerosis (ICD10-I70.0). Electronically Signed   By: Lupita Raider M.D.    On: 07/07/2021 14:10   DG Chest Portable 1 View  Result Date: 07/05/2021 CLINICAL DATA:  Weakness.  Shortness of breath. EXAM: PORTABLE CHEST 1 VIEW COMPARISON:  08/09/2019. FINDINGS: Mediastinum and hilar structures normal. Prior cardiac valve replacement. Mild cardiomegaly. No pulmonary venous congestion. Calcified pulmonary nodules again noted consistent with granulomas. No focal infiltrate. No pleural effusion or pneumothorax. IMPRESSION: Valve replacement. Mild cardiomegaly. No pulmonary venous congestion. No acute pulmonary disease. Electronically Signed   By: Maisie Fus  Register   On: 07/05/2021 09:24     Subjective: Pt reports feeling much better today.    Discharge Exam: Vitals:   07/08/21 1957 07/09/21 0454  BP: 118/74 (!) 131/57  Pulse: 75 (!) 58  Resp: 15 13  Temp: 98.1 F (36.7 C) 98.3 F (36.8 C)  SpO2: 100% 98%   Vitals:   07/08/21 1455 07/08/21 1508 07/08/21 1957 07/09/21 0454  BP:  114/71 118/74 (!) 131/57  Pulse: 64 64 75 (!) 58  Resp: Temp:  98.2 F (36.8 C) 98.1 F (36.7 C) 98.3 F (36.8 C)  TempSrc:  Oral Oral Oral  SpO2: 97% 100% 100% 98%  Weight:      Height:       General: Pt is alert, awake, not in acute distress Cardiovascular: normal S1/S2 +, no rubs, no gallops Respiratory: CTA bilaterally, no wheezing, no rhonchi Abdominal: Soft, NT, ND, bowel sounds + Extremities: no edema, no cyanosis  The results of significant diagnostics from this hospitalization (including imaging, microbiology, ancillary and laboratory) are listed below for reference.     Microbiology: Recent Results (from the past 240 hour(s))  Resp Panel by RT-PCR (Flu A&B, Covid) Nasopharyngeal Swab     Status: None   Collection Time: 07/05/21  8:55 AM   Specimen: Nasopharyngeal Swab; Nasopharyngeal(NP) swabs in vial transport medium  Result Value Ref Range Status   SARS Coronavirus 2 by RT PCR NEGATIVE NEGATIVE Final    Comment: (NOTE) SARS-CoV-2 target nucleic  acids are NOT DETECTED.  The SARS-CoV-2 RNA is generally detectable in upper respiratory specimens during the acute phase of infection. The lowest concentration of SARS-CoV-2 viral copies this assay can detect is 138 copies/mL. A negative result does not preclude SARS-Cov-2 infection and should not be used as the sole basis for treatment or other patient management decisions. A negative result may occur with  improper specimen collection/handling, submission of specimen other than nasopharyngeal swab, presence of viral mutation(s) within the areas targeted by this assay, and inadequate number of viral copies(<138 copies/mL). A negative result must be combined with clinical observations, patient history, and epidemiological information. The expected result is Negative.  Fact Sheet for Patients:  BloggerCourse.com  Fact Sheet for Healthcare Providers:  SeriousBroker.it  This test is no t yet approved or cleared by the [place] FDA and  has been authorized for detection and/or diagnosis of SARS-CoV-2 by FDA under an Emergency Use Authorization (EUA). This EUA will remain  in effect (meaning this test can be used) for the duration of the COVID-19 declaration under Section 564(b)(1) of the Act, 21 U.S.C.section 360bbb-3(b)(1), unless the authorization is terminated  or revoked sooner.       Influenza A by PCR NEGATIVE NEGATIVE Final   Influenza B by PCR NEGATIVE NEGATIVE Final    Comment: (NOTE) The Xpert Xpress SARS-CoV-2/FLU/RSV plus assay is intended as an aid in the diagnosis of influenza from Nasopharyngeal swab specimens and should not be used as a sole basis for treatment. Nasal washings and aspirates are unacceptable for Xpert Xpress SARS-CoV-2/FLU/RSV testing.  Fact Sheet for Patients: BloggerCourse.com  Fact Sheet for Healthcare Providers: SeriousBroker.it  This  test is not yet approved or cleared by the [place] FDA and has been authorized for detection and/or diagnosis of SARS-CoV-2 by FDA under an Emergency Use Authorization (EUA). This EUA will remain in effect (meaning this test can be used) for the duration of the COVID-19 declaration under Section 564(b)(1) of the Act, 21 U.S.C. section 360bbb-3(b)(1), unless the authorization is terminated or revoked.  Performed at [facility] [address], [place], [place] [address]     Labs: BNP (last 3 results) Recent Labs    07/05/21 0856  BNP 40.0   Basic Metabolic Panel: Recent Labs  Lab 07/05/21 0855 07/06/21 0402 07/07/21 0409 07/08/21 0401 07/09/21 0515  NA 134* 138 134* 139 140  K 2.9* 3.4* 3.4* 3.0* 3.2*  CL 98 102 102 108 108  CO2 GLUCOSE 132* 83 90 98 103*  BUN 43* 27* CREATININE 1.00 0.83 0.76 0.75 0.70  CALCIUM 8.3* 8.4* 7.7* 7.8* 8.2*  MG  --  1.3* 1.8 1.5* 1.7   Liver Function Tests: No results for input(s): AST, ALT, ALKPHOS, BILITOT, PROT, ALBUMIN in the last 168 hours. No results for input(s): LIPASE, AMYLASE in the last 168 hours. No results for input(s): AMMONIA in the last 168 hours. CBC:  Recent Labs  Lab 07/05/21 0855 07/06/21 0402 07/07/21 0409 07/08/21 0401 07/09/21 0515  WBC 7.4 6.0 9.5 8.4 5.5  HGB 10.8* 8.6* 9.4* 8.3* 7.5*  HCT 31.9* 25.2* 27.7* 25.0* 23.1*  MCV 85.5 86.0 87.7 89.0 89.9  PLT 213 176 166 170 150   Cardiac Enzymes: No results for input(s): CKTOTAL, CKMB, CKMBINDEX, TROPONINI in the last 168 hours. BNP: Invalid input(s): POCBNP CBG: Recent Labs  Lab 07/08/21 0737 07/08/21 1204 07/08/21 1449 07/08/21 1708 07/09/21 0723  GLUCAP 94 97 116* 118* 105*   D-Dimer No results for input(s): DDIMER in the last 72 hours. Hgb A1c No results for input(s): HGBA1C in the last 72 hours. Lipid Profile No results for input(s): CHOL, HDL, LDLCALC, TRIG, CHOLHDL, LDLDIRECT in the last 72 hours. Thyroid  function studies No results for input(s): TSH, T4TOTAL, T3FREE, THYROIDAB in the last 72 hours.  Invalid input(s): FREET3 Anemia work up No results for input(s): VITAMINB12, FOLATE, FERRITIN, TIBC, IRON, RETICCTPCT in the last 72 hours. Urinalysis    Component Value Date/Time   COLORURINE AMBER (A) 08/09/2019 1838   APPEARANCEUR HAZY (A) 08/09/2019 1838   LABSPEC 1.036 (H) 08/09/2019 1838   PHURINE 5.0 08/09/2019 1838   GLUCOSEU NEGATIVE 08/09/2019 1838   HGBUR NEGATIVE 08/09/2019 1838   BILIRUBINUR SMALL (A) 08/09/2019 1838   KETONESUR 5 (A) 08/09/2019 1838   PROTEINUR 30 (A) 08/09/2019 1838   NITRITE NEGATIVE 08/09/2019 1838   LEUKOCYTESUR MODERATE (A) 08/09/2019 1838   Sepsis Labs Invalid input(s): PROCALCITONIN,  WBC,  LACTICIDVEN Microbiology Recent Results (from the past 240 hour(s))  Resp Panel by RT-PCR (Flu A&B, Covid) Nasopharyngeal Swab     Status: None   Collection Time: 07/05/21  8:55 AM   Specimen: Nasopharyngeal Swab; Nasopharyngeal(NP) swabs in vial transport medium  Result Value Ref Range Status   SARS Coronavirus 2 by RT PCR NEGATIVE NEGATIVE Final    Comment: (NOTE) SARS-CoV-2 target nucleic acids are NOT DETECTED.  The SARS-CoV-2 RNA is generally detectable in upper respiratory specimens during the acute phase of infection. The lowest concentration of SARS-CoV-2 viral copies this assay can detect is 138 copies/mL. A negative result does not preclude SARS-Cov-2 infection and should not be used as the sole basis for treatment or other patient management decisions. A negative result may occur with  improper specimen collection/handling, submission of specimen other than nasopharyngeal swab, presence of viral mutation(s) within the areas targeted by this assay, and inadequate number of viral copies(<138 copies/mL). A negative result must be combined with clinical observations, patient history, and epidemiological information. The expected result is  Negative.  Fact Sheet for Patients:  BloggerCourse.com  Fact Sheet for Healthcare Providers:  SeriousBroker.it  This test is no t yet approved or cleared by the [place] FDA and  has been authorized for detection and/or diagnosis of SARS-CoV-2 by FDA under an Emergency Use Authorization (EUA). This EUA will remain  in effect (meaning this test can be used) for the duration of the COVID-19 declaration under Section 564(b)(1) of the Act, 21 U.S.C.section 360bbb-3(b)(1), unless the authorization is terminated  or revoked sooner.       Influenza A by PCR NEGATIVE NEGATIVE Final   Influenza B by PCR NEGATIVE NEGATIVE Final    Comment: (NOTE) The Xpert Xpress SARS-CoV-2/FLU/RSV plus assay is intended as an aid in the diagnosis of influenza from Nasopharyngeal swab specimens and should not be used as a sole basis for treatment. Nasal washings and aspirates are unacceptable for Xpert Xpress  SARS-CoV-2/FLU/RSV testing.  Fact Sheet for Patients: BloggerCourse.com  Fact Sheet for Healthcare Providers: SeriousBroker.it  This test is not yet approved or cleared by the [place] FDA and has been authorized for detection and/or diagnosis of SARS-CoV-2 by FDA under an Emergency Use Authorization (EUA). This EUA will remain in effect (meaning this test can be used) for the duration of the COVID-19 declaration under Section 564(b)(1) of the Act, 21 U.S.C. section 360bbb-3(b)(1), unless the authorization is terminated or revoked.  Performed at [facility], [address], [place], [place] [address]    Time coordinating discharge: 35 mins   SIGNED:  Standley Dakins, MD  [facility] 07/09/2021, 10:15 AM How to contact the [facility] Attending or Consulting provider 7A - 7P or covering provider during after hours 7P -7A, for this patient?  Check the care team in [facility] and look for a)  attending/consulting [facility] provider listed and b) the [facility] team listed Log into [email or web address] and use [place]'s universal password to access. If you do not have the password, please contact the hospital operator. Locate the [facility] provider you are looking for under [facility] and page to a number that you can be directly reached. If you still have difficulty reaching the provider, please page the [facility] (Director on Call) for the Hospitalists listed on amion for assistance.

## 2021-07-09 NOTE — Progress Notes (Addendum)
ANTICOAGULATION CONSULT NOTE -   Pharmacy Consult for warfarin Indication: atrial fibrillation  No Known Allergies  Patient Measurements: Height: 5\' 7"  (170.2 cm) Weight: 81 kg (178 lb 9.2 oz) IBW/kg (Calculated) : 61.6  Vital Signs: Temp: 98.3 F (36.8 C) (07/23 0454) Temp Source: Oral (07/23 0454) BP: 131/57 (07/23 0454) Pulse Rate: 58 (07/23 0454)  Labs: Recent Labs    07/07/21 0409 07/08/21 0401 07/09/21 0515  HGB 9.4* 8.3* 7.5*  HCT 27.7* 25.0* 23.1*  PLT 166 170 150  LABPROT 16.8* 17.6* 17.4*  INR 1.4* 1.4* 1.4*  CREATININE 0.76 0.75 0.70     Estimated Creatinine Clearance: 63.5 mL/min (by C-G formula based on SCr of 0.7 mg/dL).   Medical History: Past Medical History:  Diagnosis Date   Diabetes mellitus without complication (HCC)     Medications:  Medications Prior to Admission  Medication Sig Dispense Refill Last Dose   albuterol (VENTOLIN HFA) 108 (90 Base) MCG/ACT inhaler Inhale 2 puffs into the lungs every 6 (six) hours as needed for wheezing or shortness of breath.    PRN   atorvastatin (LIPITOR) 20 MG tablet Take 20 mg by mouth every evening.    07/04/2021   donepezil (ARICEPT) 10 MG tablet Take 10 mg by mouth daily.   07/05/2021   ibuprofen (ADVIL) 200 MG tablet Take 400 mg by mouth daily as needed for moderate pain.   PRN   isosorbide mononitrate (IMDUR) 30 MG 24 hr tablet Take 30 mg by mouth daily.    07/05/2021   memantine (NAMENDA) 10 MG tablet Take 10 mg by mouth 2 (two) times daily.   07/05/2021   metFORMIN (GLUCOPHAGE) 500 MG tablet Take 500 mg by mouth 2 (two) times daily with a meal.   07/05/2021   naproxen (NAPROSYN) 500 MG tablet Take 500 mg by mouth 2 (two) times daily.   07/05/2021   omeprazole (PRILOSEC) 40 MG capsule Take 40 mg by mouth daily.   07/05/2021   QUEtiapine (SEROQUEL) 25 MG tablet Take 25 mg by mouth at bedtime.   Past Week   traMADol (ULTRAM) 50 MG tablet Take 50 mg by mouth daily as needed (pain).    Past Week   traZODone  (DESYREL) 150 MG tablet Take 300 mg by mouth at bedtime.    07/04/2021   warfarin (COUMADIN) 5 MG tablet Take 7.5 mg by mouth daily.   07/05/2021 at 0630    Assessment: Pharmacy consulted to dose warfarin in patient with atrial fibrillation.  Patient's INR on admission > 10 and received 5 mg IV Vitamin K.  INR today is 1.5. Home dose listed as 7.5 mg daily and last dose 7/10 0630.  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 FOBT +  EGD showed gastric erosions, 3 gastric ulcer, hiatal hernia, duodenitis. MD wants to restart Coumadin today. Watch closely, hgb 7.5  Goal of Therapy:  INR 2-3 Monitor platelets by anticoagulation protocol: Yes   Plan:  Warfarin 7.5mg  po x 1 today Daily PT-INR Monitor for S/S of bleeding  07/07/2021, BS Elder Cyphers, BCPS Clinical Pharmacist Pager [phone] 07/09/2021 9:21 AM

## 2021-07-11 ENCOUNTER — Encounter (HOSPITAL_COMMUNITY): Payer: Self-pay | Admitting: Gastroenterology

## 2021-07-12 LAB — SURGICAL PATHOLOGY

## 2021-07-14 ENCOUNTER — Ambulatory Visit (INDEPENDENT_AMBULATORY_CARE_PROVIDER_SITE_OTHER): Payer: Medicare Other | Admitting: *Deleted

## 2021-07-14 DIAGNOSIS — Z5181 Encounter for therapeutic drug level monitoring: Secondary | ICD-10-CM | POA: Diagnosis not present

## 2021-07-14 DIAGNOSIS — I4891 Unspecified atrial fibrillation: Secondary | ICD-10-CM

## 2021-07-14 LAB — POCT INR: INR: 1.5 — AB (ref 2.0–3.0)

## 2021-07-14 NOTE — Patient Instructions (Signed)
Per hospital d/c take warfarin 5mg  daily.  Previously managed by [facility] Pt has been taking 7.5mg  daily except 10mg  on M,W,F (pre-hosp dose) Order given to [facility] while in pt's home.  Take 10mg  tonight then 7.5mg  daily until INR check on 07/19/21

## 2021-07-19 ENCOUNTER — Ambulatory Visit (INDEPENDENT_AMBULATORY_CARE_PROVIDER_SITE_OTHER): Payer: Medicare Other | Admitting: *Deleted

## 2021-07-19 ENCOUNTER — Telehealth: Payer: Self-pay | Admitting: *Deleted

## 2021-07-19 DIAGNOSIS — I4891 Unspecified atrial fibrillation: Secondary | ICD-10-CM

## 2021-07-19 DIAGNOSIS — Z5181 Encounter for therapeutic drug level monitoring: Secondary | ICD-10-CM

## 2021-07-19 LAB — POCT INR: INR: 4.8 — AB (ref 2.0–3.0)

## 2021-07-19 NOTE — Patient Instructions (Signed)
Per hospital d/c take warfarin 5mg  daily.  Previously managed by [facility] Pt has been taking 7.5mg  daily except 10mg  on M,W,F (pre-hosp dose) Order given to [facility] while in pt's home.  Hold warfarin tonight, take 2.5mg  tomorrow night then start 7.5mg  daily except 5mg  on Sunday and Thursday Recheck INR check on 07/26/21

## 2021-07-19 NOTE — Telephone Encounter (Signed)
Shanda Bumps at [facility]  [phone]  Called to report PT INR for Kathleen Barnett INR 4.8  and PT 57.9

## 2021-07-19 NOTE — Telephone Encounter (Signed)
Spoke with Jessica RN [facility].  Coumadin orders given.  See anticoag note. 

## 2021-07-25 ENCOUNTER — Ambulatory Visit (INDEPENDENT_AMBULATORY_CARE_PROVIDER_SITE_OTHER): Payer: Medicare Other | Admitting: *Deleted

## 2021-07-25 DIAGNOSIS — Z5181 Encounter for therapeutic drug level monitoring: Secondary | ICD-10-CM | POA: Diagnosis not present

## 2021-07-25 DIAGNOSIS — I4891 Unspecified atrial fibrillation: Secondary | ICD-10-CM | POA: Diagnosis not present

## 2021-07-25 LAB — POCT INR: INR: 1.5 — AB (ref 2.0–3.0)

## 2021-07-25 NOTE — Patient Instructions (Signed)
Pt has been taking 7.5mg  daily except 10mg  on M,W,F (pre-hosp dose) Order given to [facility] while in pt's home.  Take warfarin 10mg  tonight then increase dose to 7.5mg  daily Recheck INR check on 08/01/21

## 2021-07-25 NOTE — Progress Notes (Deleted)
Cardiology Office Note    Date:  07/25/2021   ID:  Kathleen Barnett, DOB December 07, 1941, MRN [id number]   PCP:  Kathleen Pearson, FNP   [facility]  Cardiologist:  None *** Advanced Practice Provider:  No care team member to display Electrophysiologist:  None   [id number]}   No chief complaint on file.   History of Present Illness:  Kathleen Barnett is a 80 y.o. female  with a hx of AFIB (on Coumadin), aortic valve replacement, DM, HL, and cognitive impairment.  In July with melena and INR greater than 10, EGD finding several gastric ulcers.  She was treated with vitamin K and FFP and Coumadin was held before it was resumed.  She had frequent PVCs with bursts of PAT/A. fib but no evidence of high degree heart block.  Electrolytes were optimized.  Dr. Diona Browner recommended an considering echo to define AVR.  Past Medical History:  Diagnosis Date   Diabetes mellitus without complication [facility])     Past Surgical History:  Procedure Laterality Date   ABDOMINAL HYSTERECTOMY     APPENDECTOMY     BIOPSY  07/08/2021   Procedure: BIOPSY;  Surgeon: Dolores Frame, MD;  Location: [facility];  Service: Gastroenterology;;   CATARACT EXTRACTION W/PHACO Right 12/29/2019   Procedure: CATARACT EXTRACTION PHACO AND INTRAOCULAR LENS PLACEMENT RIGHT EYE;  Surgeon: Fabio Pierce, MD;  Location: [facility] ORS;  Service: Ophthalmology;  Laterality: Right;  CDE: 18.10   CATARACT EXTRACTION W/PHACO Left 01/26/2020   Procedure: CATARACT EXTRACTION PHACO AND INTRAOCULAR LENS PLACEMENT (IOC);  Surgeon: Fabio Pierce, MD;  Location: [facility] ORS;  Service: Ophthalmology;  Laterality: Left;  CDE: 8.22   CHOLECYSTECTOMY     ESOPHAGOGASTRODUODENOSCOPY (EGD) WITH PROPOFOL N/A 07/08/2021   Procedure: ESOPHAGOGASTRODUODENOSCOPY (EGD) WITH PROPOFOL;  Surgeon: Dolores Frame, MD;  Location: [facility];  Service: Gastroenterology;  Laterality: N/A;    Current Medications: No outpatient  medications have been marked as taking for the 08/02/21 encounter (Appointment) with Dyann Kief, PA-C.     Allergies:   Patient has no known allergies.   Social History   Socioeconomic History   Marital status: Single    Spouse name: Not on file   Number of children: Not on file   Years of education: Not on file   Highest education level: Not on file  Occupational History   Not on file  Tobacco Use   Smoking status: Every Day    Packs/day: 0.25    Types: Cigarettes   Smokeless tobacco: Never  Vaping Use   Vaping Use: Never used  Substance and Sexual Activity   Alcohol use: Not Currently   Drug use: Never   Sexual activity: Not on file  Other Topics Concern   Not on file  Social History Narrative   Not on file   Social Determinants of Health   Financial Resource Strain: Not on file  Food Insecurity: Not on file  Transportation Needs: Not on file  Physical Activity: Not on file  Stress: Not on file  Social Connections: Not on file     Family History:  The patient's ***family history is not on file.   ROS:   Please see the history of present illness.    ROS All other systems reviewed and are negative.   PHYSICAL EXAM:   VS:  There were no vitals taken for this visit.  Physical Exam  GEN: Well nourished, well developed, in no acute distress  HEENT: normal  Neck: no JVD, carotid bruits, or masses Cardiac:RRR; no murmurs, rubs, or gallops  Respiratory:  clear to auscultation bilaterally, normal work of breathing GI: soft, nontender, nondistended, + BS Ext: without cyanosis, clubbing, or edema, Good distal pulses bilaterally MS: no deformity or atrophy  Skin: warm and dry, no rash Neuro:  Alert and Oriented x 3, Strength and sensation are intact Psych: euthymic mood, full affect  Wt Readings from Last 3 Encounters:  07/07/21 178 lb 9.2 oz (81 kg)  08/09/19 206 lb (93.4 kg)      Studies/Labs Reviewed:   EKG:  EKG is*** ordered today.  The ekg  ordered today demonstrates ***  Recent Labs: 07/05/2021: B Natriuretic Peptide 40.0 07/09/2021: BUN 8; Creatinine, Ser 0.70; Hemoglobin 7.5; Magnesium 1.7; Platelets 150; Potassium 3.2; Sodium 140   Lipid Panel No results found for: CHOL, TRIG, HDL, CHOLHDL, VLDL, LDLCALC, LDLDIRECT  Additional studies/ records that were reviewed today include:  ***   Risk Assessment/Calculations:   {Does this patient have ATRIAL FIBRILLATION?:[phone]}     ASSESSMENT:    1. S/P AVR (aortic valve replacement)   2. Atrial fibrillation, unspecified type (HCC)   3. Chronic anticoagulation   4. Heme positive stool      PLAN:  In order of problems listed above:  AVR on Coumadin no records available for [facility] valve but appears to be bioprosthesis.  GI bleed with gastric ulcers in the setting of INR greater than 10  Frequent PVCs/PAT/A. fib no evidence of high degree AV block    Shared Decision Making/Informed Consent   {Are you ordering a CV Procedure (e.g. stress test, cath, DCCV, TEE, etc)?   Press F2        :[id number]}    Medication Adjustments/Labs and Tests Ordered: Current medicines are reviewed at length with the patient today.  Concerns regarding medicines are outlined above.  Medication changes, Labs and Tests ordered today are listed in the Patient Instructions below. There are no Patient Instructions on file for this visit.   Elson Clan, PA-C  07/25/2021 10:40 AM    [facility] [address] [place], [place], [place]  [address] Phone: [phone]; Fax: [phone]

## 2021-08-01 ENCOUNTER — Telehealth: Payer: Self-pay | Admitting: *Deleted

## 2021-08-01 ENCOUNTER — Other Ambulatory Visit (HOSPITAL_COMMUNITY)
Admission: RE | Admit: 2021-08-01 | Discharge: 2021-08-01 | Disposition: A | Discharge status: Home / Self Care | Payer: Medicare Other | Source: Other Acute Inpatient Hospital | Attending: Cardiology | Admitting: Cardiology

## 2021-08-01 ENCOUNTER — Ambulatory Visit (INDEPENDENT_AMBULATORY_CARE_PROVIDER_SITE_OTHER): Payer: Medicare Other | Admitting: *Deleted

## 2021-08-01 DIAGNOSIS — R791 Abnormal coagulation profile: Secondary | ICD-10-CM | POA: Diagnosis present

## 2021-08-01 DIAGNOSIS — Z5181 Encounter for therapeutic drug level monitoring: Secondary | ICD-10-CM

## 2021-08-01 DIAGNOSIS — I4891 Unspecified atrial fibrillation: Secondary | ICD-10-CM | POA: Diagnosis not present

## 2021-08-01 LAB — PROTIME-INR
INR: 9.4 (ref 0.8–1.2)
Prothrombin Time: 76.3 seconds — ABNORMAL HIGH (ref 11.4–15.2)

## 2021-08-01 NOTE — Patient Instructions (Signed)
Hold warfarin x 3 days and Recheck INR on Thursday 08/04/21 by [facility] Order given to Laney Potash RN [facility] PT has not had any S/S of bleeding.  Bleeding and fall precautions discussed with pt. By [facility] nurse.  Pt to report to [facility] ED if she develops any symptoms.

## 2021-08-01 NOTE — Telephone Encounter (Signed)
Jessica RN [facility] called.  She is at Pt's house to check INR.  She has attempted POC INR twice with no reading.  She will draw IV and take sample to [facility] for STAT reading.

## 2021-08-02 ENCOUNTER — Ambulatory Visit: Payer: Medicare Other | Admitting: Physician Assistant

## 2021-08-02 DIAGNOSIS — Z7901 Long term (current) use of anticoagulants: Secondary | ICD-10-CM

## 2021-08-02 DIAGNOSIS — I4891 Unspecified atrial fibrillation: Secondary | ICD-10-CM

## 2021-08-02 DIAGNOSIS — Z952 Presence of prosthetic heart valve: Secondary | ICD-10-CM

## 2021-08-02 DIAGNOSIS — R195 Other fecal abnormalities: Secondary | ICD-10-CM

## 2021-08-04 ENCOUNTER — Ambulatory Visit (INDEPENDENT_AMBULATORY_CARE_PROVIDER_SITE_OTHER): Payer: Medicare Other | Admitting: Internal Medicine

## 2021-08-04 ENCOUNTER — Other Ambulatory Visit (HOSPITAL_COMMUNITY)
Admission: RE | Admit: 2021-08-04 | Discharge: 2021-08-04 | Disposition: A | Discharge status: Home / Self Care | Payer: Medicare Other | Source: Other Acute Inpatient Hospital | Attending: Cardiology | Admitting: Cardiology

## 2021-08-04 DIAGNOSIS — Z5181 Encounter for therapeutic drug level monitoring: Secondary | ICD-10-CM | POA: Diagnosis not present

## 2021-08-04 DIAGNOSIS — Z7901 Long term (current) use of anticoagulants: Secondary | ICD-10-CM | POA: Insufficient documentation

## 2021-08-04 LAB — PROTIME-INR
INR: 6.3 (ref 0.8–1.2)
Prothrombin Time: 55.7 seconds — ABNORMAL HIGH (ref 11.4–15.2)

## 2021-08-04 LAB — POCT INR: INR: 7.9 — AB (ref 2.0–3.0)

## 2021-08-04 NOTE — Patient Instructions (Addendum)
Description   Spoke to Kathleen Barnett from [facility] instructed for pt to hold warfarin 8/18, 8/19 and 8/20, then start taking warfarin 1.5 tablets daily except for 1 tablet on Monday and Friday. Recheck INR on Monday 8/22 by home health.  Pt needs to scheudle appointment with cardiologist and verify the type of heart valve which could change INR range.

## 2021-08-04 NOTE — Progress Notes (Signed)
8/18 @ 1:30 pm - Home health called and stated that pt's POC INR was 7.9, they will get lab INR on pt and will call with results. Instructed for pt to not take any warfarin until we get lab INR results and give further dosing instructions.   8/18 @ 4:15 pm-Spoke to Shanda Bumps from  [facility] who stated that another nurse was going to have to stick the pt to get lab INR, however, the results might not be in until tomorrow (8/19) Shanda Bumps confirmed that she told the pt to hold her warfarin until lab INR results and new instructions are given.

## 2021-08-08 ENCOUNTER — Telehealth: Payer: Self-pay

## 2021-08-08 ENCOUNTER — Ambulatory Visit (INDEPENDENT_AMBULATORY_CARE_PROVIDER_SITE_OTHER): Payer: Medicare Other | Admitting: *Deleted

## 2021-08-08 LAB — POCT INR: INR: 1.5 — AB (ref 2.0–3.0)

## 2021-08-08 NOTE — Telephone Encounter (Signed)
Lmom to call back to schedule appointment that patient missed

## 2021-08-08 NOTE — Patient Instructions (Signed)
Take warfarin 1 1/2 tablets tonight then resume 1.5 tablets daily except for 1 tablet on Monday and Friday. Recheck INR on 08/16/21 in [place] office.  [facility] discharged today.  Awaiting appt with MD to verify type of heart valve.

## 2021-08-10 ENCOUNTER — Ambulatory Visit (INDEPENDENT_AMBULATORY_CARE_PROVIDER_SITE_OTHER): Payer: Medicare Other | Admitting: Gastroenterology

## 2021-08-19 ENCOUNTER — Other Ambulatory Visit: Payer: Self-pay

## 2021-08-19 DIAGNOSIS — Z7901 Long term (current) use of anticoagulants: Secondary | ICD-10-CM

## 2021-08-21 ENCOUNTER — Other Ambulatory Visit: Payer: Self-pay

## 2021-08-21 ENCOUNTER — Inpatient Hospital Stay (HOSPITAL_COMMUNITY)
Admission: EM | Admit: 2021-08-21 | Discharge: 2021-08-26 | DRG: 813 | Disposition: A | Discharge status: Home Health | Payer: Medicare Other | Attending: Internal Medicine | Admitting: Internal Medicine

## 2021-08-21 DIAGNOSIS — Z79899 Other long term (current) drug therapy: Secondary | ICD-10-CM

## 2021-08-21 DIAGNOSIS — E876 Hypokalemia: Secondary | ICD-10-CM | POA: Diagnosis not present

## 2021-08-21 DIAGNOSIS — E1122 Type 2 diabetes mellitus with diabetic chronic kidney disease: Secondary | ICD-10-CM | POA: Diagnosis present

## 2021-08-21 DIAGNOSIS — E872 Acidosis: Secondary | ICD-10-CM | POA: Diagnosis present

## 2021-08-21 DIAGNOSIS — I4891 Unspecified atrial fibrillation: Secondary | ICD-10-CM | POA: Diagnosis present

## 2021-08-21 DIAGNOSIS — R627 Adult failure to thrive: Secondary | ICD-10-CM | POA: Diagnosis present

## 2021-08-21 DIAGNOSIS — R791 Abnormal coagulation profile: Secondary | ICD-10-CM

## 2021-08-21 DIAGNOSIS — N179 Acute kidney failure, unspecified: Secondary | ICD-10-CM

## 2021-08-21 DIAGNOSIS — R4189 Other symptoms and signs involving cognitive functions and awareness: Secondary | ICD-10-CM | POA: Diagnosis present

## 2021-08-21 DIAGNOSIS — Z8616 Personal history of COVID-19: Secondary | ICD-10-CM

## 2021-08-21 DIAGNOSIS — D6832 Hemorrhagic disorder due to extrinsic circulating anticoagulants: Principal | ICD-10-CM | POA: Diagnosis present

## 2021-08-21 DIAGNOSIS — E278 Other specified disorders of adrenal gland: Secondary | ICD-10-CM | POA: Diagnosis present

## 2021-08-21 DIAGNOSIS — Z8711 Personal history of peptic ulcer disease: Secondary | ICD-10-CM

## 2021-08-21 DIAGNOSIS — E871 Hypo-osmolality and hyponatremia: Secondary | ICD-10-CM | POA: Diagnosis present

## 2021-08-21 DIAGNOSIS — R1110 Vomiting, unspecified: Secondary | ICD-10-CM

## 2021-08-21 DIAGNOSIS — K219 Gastro-esophageal reflux disease without esophagitis: Secondary | ICD-10-CM | POA: Diagnosis present

## 2021-08-21 DIAGNOSIS — E785 Hyperlipidemia, unspecified: Secondary | ICD-10-CM | POA: Diagnosis present

## 2021-08-21 DIAGNOSIS — Z87440 Personal history of urinary (tract) infections: Secondary | ICD-10-CM

## 2021-08-21 DIAGNOSIS — Z9049 Acquired absence of other specified parts of digestive tract: Secondary | ICD-10-CM

## 2021-08-21 DIAGNOSIS — K279 Peptic ulcer, site unspecified, unspecified as acute or chronic, without hemorrhage or perforation: Secondary | ICD-10-CM | POA: Diagnosis not present

## 2021-08-21 DIAGNOSIS — Z90710 Acquired absence of both cervix and uterus: Secondary | ICD-10-CM

## 2021-08-21 DIAGNOSIS — D72829 Elevated white blood cell count, unspecified: Secondary | ICD-10-CM | POA: Diagnosis present

## 2021-08-21 DIAGNOSIS — F0391 Unspecified dementia with behavioral disturbance: Secondary | ICD-10-CM | POA: Diagnosis present

## 2021-08-21 DIAGNOSIS — R531 Weakness: Secondary | ICD-10-CM

## 2021-08-21 DIAGNOSIS — Z20822 Contact with and (suspected) exposure to covid-19: Secondary | ICD-10-CM | POA: Diagnosis present

## 2021-08-21 DIAGNOSIS — T45515A Adverse effect of anticoagulants, initial encounter: Secondary | ICD-10-CM | POA: Diagnosis present

## 2021-08-21 DIAGNOSIS — R634 Abnormal weight loss: Secondary | ICD-10-CM

## 2021-08-21 DIAGNOSIS — N1831 Chronic kidney disease, stage 3a: Secondary | ICD-10-CM | POA: Diagnosis present

## 2021-08-21 DIAGNOSIS — Z953 Presence of xenogenic heart valve: Secondary | ICD-10-CM

## 2021-08-21 DIAGNOSIS — Z6828 Body mass index (BMI) 28.0-28.9, adult: Secondary | ICD-10-CM

## 2021-08-21 DIAGNOSIS — E860 Dehydration: Secondary | ICD-10-CM | POA: Diagnosis present

## 2021-08-21 DIAGNOSIS — F17210 Nicotine dependence, cigarettes, uncomplicated: Secondary | ICD-10-CM | POA: Diagnosis present

## 2021-08-21 DIAGNOSIS — K921 Melena: Secondary | ICD-10-CM | POA: Diagnosis present

## 2021-08-21 HISTORY — DX: Unspecified dementia, unspecified severity, without behavioral disturbance, psychotic disturbance, mood disturbance, and anxiety: F03.90

## 2021-08-21 LAB — CBC WITH DIFFERENTIAL/PLATELET
Abs Immature Granulocytes: 0.04 10*3/uL (ref 0.00–0.07)
Basophils Absolute: 0.1 10*3/uL (ref 0.0–0.1)
Basophils Relative: 1 %
Eosinophils Absolute: 0 10*3/uL (ref 0.0–0.5)
Eosinophils Relative: 0 %
HCT: 40.5 % (ref 36.0–46.0)
Hemoglobin: 14 g/dL (ref 12.0–15.0)
Immature Granulocytes: 0 %
Lymphocytes Relative: 15 %
Lymphs Abs: 1.9 10*3/uL (ref 0.7–4.0)
MCH: 30.2 pg (ref 26.0–34.0)
MCHC: 34.6 g/dL (ref 30.0–36.0)
MCV: 87.3 fL (ref 80.0–100.0)
Monocytes Absolute: 0.6 10*3/uL (ref 0.1–1.0)
Monocytes Relative: 5 %
Neutro Abs: 9.8 10*3/uL — ABNORMAL HIGH (ref 1.7–7.7)
Neutrophils Relative %: 79 %
Platelets: 207 10*3/uL (ref 150–400)
RBC: 4.64 MIL/uL (ref 3.87–5.11)
RDW: 14.6 % (ref 11.5–15.5)
WBC: 12.5 10*3/uL — ABNORMAL HIGH (ref 4.0–10.5)
nRBC: 0 % (ref 0.0–0.2)

## 2021-08-21 LAB — RESP PANEL BY RT-PCR (FLU A&B, COVID) ARPGX2
Influenza A by PCR: NEGATIVE
Influenza B by PCR: NEGATIVE
SARS Coronavirus 2 by RT PCR: NEGATIVE

## 2021-08-21 LAB — COMPREHENSIVE METABOLIC PANEL
ALT: 16 U/L (ref 0–44)
AST: 28 U/L (ref 15–41)
Albumin: 3.8 g/dL (ref 3.5–5.0)
Alkaline Phosphatase: 67 U/L (ref 38–126)
Anion gap: 20 — ABNORMAL HIGH (ref 5–15)
BUN: 31 mg/dL — ABNORMAL HIGH (ref 8–23)
CO2: 20 mmol/L — ABNORMAL LOW (ref 22–32)
Calcium: 8 mg/dL — ABNORMAL LOW (ref 8.9–10.3)
Chloride: 92 mmol/L — ABNORMAL LOW (ref 98–111)
Creatinine, Ser: 1.4 mg/dL — ABNORMAL HIGH (ref 0.44–1.00)
GFR, Estimated: 39 mL/min — ABNORMAL LOW (ref >60)
Glucose, Bld: 100 mg/dL — ABNORMAL HIGH (ref 70–99)
Potassium: 2.6 mmol/L — CL (ref 3.5–5.1)
Sodium: 132 mmol/L — ABNORMAL LOW (ref 135–145)
Total Bilirubin: 1.2 mg/dL (ref 0.3–1.2)
Total Protein: 6.9 g/dL (ref 6.5–8.1)

## 2021-08-21 LAB — PROTIME-INR
INR: >10 (ref 0.8–1.2)
Prothrombin Time: >90 seconds — ABNORMAL HIGH (ref 11.4–15.2)

## 2021-08-21 LAB — CBG MONITORING, ED: Glucose-Capillary: 111 mg/dL — ABNORMAL HIGH (ref 70–99)

## 2021-08-21 LAB — POC OCCULT BLOOD, ED: Fecal Occult Bld: POSITIVE — AB

## 2021-08-21 MED ORDER — VITAMIN K1 10 MG/ML IJ SOLN
2.5000 mg | Freq: Once | INTRAVENOUS | Status: AC
  Administered 2021-08-21: 2.5 mg via INTRAVENOUS
  Filled 2021-08-21: qty 0.25

## 2021-08-21 MED ORDER — POTASSIUM CHLORIDE 20 MEQ PO PACK
40.0000 meq | PACK | Freq: Once | ORAL | Status: AC
  Administered 2021-08-21: 40 meq via ORAL
  Filled 2021-08-21: qty 2

## 2021-08-21 MED ORDER — VITAMIN K1 10 MG/ML IJ SOLN
INTRAMUSCULAR | Status: AC
  Filled 2021-08-21: qty 1

## 2021-08-21 MED ORDER — POTASSIUM CHLORIDE CRYS ER 20 MEQ PO TBCR: 40.0000 meq | EXTENDED_RELEASE_TABLET | Freq: Once | ORAL | Status: DC

## 2021-08-21 MED ORDER — LACTATED RINGERS IV BOLUS
1000.0000 mL | Freq: Once | INTRAVENOUS | Status: AC
  Administered 2021-08-21: 1000 mL via INTRAVENOUS

## 2021-08-21 MED ORDER — VITAMIN K1 10 MG/ML IJ SOLN
2.5000 mg | Freq: Once | INTRAVENOUS | Status: AC
  Filled 2021-08-21: qty 0.25

## 2021-08-21 MED ORDER — PANTOPRAZOLE SODIUM 40 MG IV SOLR: 40.0000 mg | Freq: Two times a day (BID) | INTRAVENOUS | Status: DC

## 2021-08-21 MED ORDER — POTASSIUM CHLORIDE 10 MEQ/100ML IV SOLN
10.0000 meq | INTRAVENOUS | Status: AC
  Administered 2021-08-21 (×4): 10 meq via INTRAVENOUS
  Filled 2021-08-21 (×4): qty 100

## 2021-08-21 MED ORDER — ONDANSETRON HCL 4 MG/2ML IJ SOLN
4.0000 mg | Freq: Four times a day (QID) | INTRAMUSCULAR | Status: DC | PRN
  Administered 2021-08-21: 4 mg via INTRAVENOUS
  Filled 2021-08-21: qty 2

## 2021-08-21 NOTE — Progress Notes (Signed)
[facility] night shift telemetry coverage note.  The nursing staff reported that the patient had an episode of emesis after taking her oral potassium and vomited the contents of the KCl tablet.  Ondansetron 4 mg IVP every 6 hours as needed ordered.  Sanda Klein, MD

## 2021-08-21 NOTE — ED Provider Notes (Signed)
[facility] EMERGENCY DEPARTMENT Provider Note  CSN: [id number] Arrival date & time: 08/21/21 1756    History Chief Complaint  Patient presents with   Weakness    Kathleen Barnett is a 80 y.o. female with history dementia. Was in the hospital about 6 weeks ago for PUD, discharged home 7/23. Per daughter she has not been eating or drinking since she left the hospital. She has lost 60lbs since discharge. She is on coumadin for atrial fibrillation, daughter reports INR has been elevated. She reports she vomited when trying to drink a nutritional supplement recently. It is not clear why she decided to send the patient back to the hospital today as opposed to earlier. Patient cannot provide any further history. Level 5 caveat applies   Past Medical History:  Diagnosis Date   Diabetes mellitus without complication [facility])     Past Surgical History:  Procedure Laterality Date   ABDOMINAL HYSTERECTOMY     APPENDECTOMY     BIOPSY  07/08/2021   Procedure: BIOPSY;  Surgeon: Dolores Frame, MD;  [facility];  Service: Gastroenterology;;   CATARACT EXTRACTION W/PHACO Right 12/29/2019   Procedure: CATARACT EXTRACTION PHACO AND INTRAOCULAR LENS PLACEMENT RIGHT EYE;  Surgeon: Fabio Pierce, MD;  Location: [facility] ORS;  Service: Ophthalmology;  Laterality: Right;  CDE: 18.10   CATARACT EXTRACTION W/PHACO Left 01/26/2020   Procedure: CATARACT EXTRACTION PHACO AND INTRAOCULAR LENS PLACEMENT (IOC);  Surgeon: Fabio Pierce, MD;  Location: [facility] ORS;  Service: Ophthalmology;  Laterality: Left;  CDE: 8.22   CHOLECYSTECTOMY     ESOPHAGOGASTRODUODENOSCOPY (EGD) WITH PROPOFOL N/A 07/08/2021   Procedure: ESOPHAGOGASTRODUODENOSCOPY (EGD) WITH PROPOFOL;  Surgeon: Dolores Frame, MD;  Location[facility];  Service: Gastroenterology;  Laterality: N/A;    Family History  Problem Relation Age of Onset   Colon cancer Neg Hx     Social History   Tobacco Use   Smoking status: Every  Day    Packs/day: 0.25    Types: Cigarettes   Smokeless tobacco: Never  Vaping Use   Vaping Use: Never used  Substance Use Topics   Alcohol use: Not Currently   Drug use: Never     Home Medications Prior to Admission medications   Medication Sig Start Date End Date Taking? Authorizing Provider  albuterol (VENTOLIN HFA) 108 (90 Base) MCG/ACT inhaler Inhale 2 puffs into the lungs every 6 (six) hours as needed for wheezing or shortness of breath.     [provider]  atorvastatin (LIPITOR) 20 MG tablet Take 20 mg by mouth every evening.     [provider]  docusate sodium (COLACE) 100 MG capsule Take 1 capsule (100 mg total) by mouth 2 (two) times daily. 07/09/21 07/09/22  Johnson, [name], MD  donepezil (ARICEPT) 10 MG tablet Take 10 mg by mouth daily.    [provider]  ferrous sulfate 325 (65 FE) MG tablet Take 1 tablet (325 mg total) by mouth daily with breakfast. 07/09/21   [name], [name], MD  isosorbide mononitrate (IMDUR) 30 MG 24 hr tablet Take 30 mg by mouth daily.     [provider]  memantine (NAMENDA) 10 MG tablet Take 10 mg by mouth 2 (two) times daily.    [provider]  metFORMIN (GLUCOPHAGE) 500 MG tablet Take 500 mg by mouth 2 (two) times daily with a meal.    [provider]  omeprazole (PRILOSEC) 40 MG capsule Take 1 capsule (40 mg total) by mouth  in the morning and at bedtime. 07/09/21   Johnson, [name], MD  QUEtiapine (SEROQUEL) 25 MG tablet Take 1 tablet (25 mg total) by mouth at bedtime. 07/09/21   Johnson, [name], MD  traMADol (ULTRAM) 50 MG tablet Take 50 mg by mouth daily as needed (pain).     [provider]  traZODone (DESYREL) 150 MG tablet Take 1 tablet (150 mg total) by mouth at bedtime. 07/09/21   [name], MD  warfarin (COUMADIN) 5 MG tablet Take 1 tablet (5 mg total) by mouth daily at 4 PM. 07/09/21   [name], MD     Allergies    Patient has no known  allergies.   Review of Systems   Review of Systems Unable to assess due to mental status.    Physical Exam BP (!) 142/73 (BP Location: Left Arm)   Pulse 81   Temp 97.8 F (36.6 C) (Oral)   Resp 18   Ht 5\' 7"  (1.702 m)   Wt 83.5 kg   SpO2 98%   BMI 28.82 kg/m   Physical Exam Vitals and nursing note reviewed.  Constitutional:      Appearance: Normal appearance.  HENT:     Head: Normocephalic and atraumatic.     Nose: Nose normal.     Mouth/Throat:     Mouth: Mucous membranes are moist.  Eyes:     Extraocular Movements: Extraocular movements intact.     Conjunctiva/sclera: Conjunctivae normal.  Cardiovascular:     Rate and Rhythm: Normal rate.  Pulmonary:     Effort: Pulmonary effort is normal.     Breath sounds: Normal breath sounds.  Abdominal:     General: Abdomen is flat.     Palpations: Abdomen is soft.     Tenderness: There is no abdominal tenderness.  Genitourinary:    Comments: Loose black stool Musculoskeletal:        General: No swelling. Normal range of motion.     Cervical back: Neck supple.  Skin:    General: Skin is warm and dry.  Neurological:     General: No focal deficit present.     Mental Status: She is alert. She is disoriented.  Psychiatric:        Mood and Affect: Mood normal.     ED Results / Procedures / Treatments   Labs (all labs ordered are listed, but only abnormal results are displayed) Labs Reviewed  COMPREHENSIVE METABOLIC PANEL - Abnormal; Notable for the following components:      Result Value   Sodium 132 (*)    Potassium 2.6 (*)    Chloride 92 (*)    CO2 20 (*)    Glucose, Bld 100 (*)    BUN 31 (*)    Creatinine, Ser 1.40 (*)    Calcium 8.0 (*)    GFR, Estimated 39 (*)    Anion gap 20 (*)    All other components within normal limits  CBC WITH DIFFERENTIAL/PLATELET - Abnormal; Notable for the following components:   WBC 12.5 (*)    Neutro Abs 9.8 (*)    All other components within normal limits  PROTIME-INR -  Abnormal; Notable for the following components:   Prothrombin Time >90.0 (*)    INR >10.0 (*)    All other components within normal limits  CBG MONITORING, ED - Abnormal; Notable for the following components:   Glucose-Capillary 111 (*)    All other components within normal limits  POC OCCULT  BLOOD, ED - Abnormal; Notable for the following components:   Fecal Occult Bld POSITIVE (*)    All other components within normal limits  RESP PANEL BY RT-PCR (FLU A&B, COVID) [id number]  URINALYSIS, ROUTINE W REFLEX MICROSCOPIC    EKG EKG Interpretation  Date/Time:  Sunday August 21 2021 18:11:32 EDT Ventricular Rate:  118 PR Interval:  129 QRS Duration: 88 QT Interval:  326 QTC Calculation: 457 R Axis:   -46 Text Interpretation: Sinus tachycardia Multiform ventricular premature complexes Left anterior fascicular block Consider anterior infarct Nonspecific repol abnormality, lateral leads No significant change since last tracing Confirmed by Susy Frizzle [id number]) on 08/21/2021 7:07:33 PM  Radiology No results found.  Procedures Procedures  Medications Ordered in the ED Medications  phytonadione (VITAMIN K) 2.5 mg in dextrose 5 % 50 mL IVPB (has no administration in time range)  lactated ringers bolus 1,000 mL (1,000 mLs Intravenous New Bag/Given 08/21/21 1944)  potassium chloride 10 mEq in 100 mL IVPB (10 mEq Intravenous New Bag/Given 08/21/21 2119)  potassium chloride (KLOR-CON) packet 40 mEq (40 mEq Oral Given 08/21/21 1947)  phytonadione (VITAMIN K) 2.5 mg in dextrose 5 % 50 mL IVPB (2.5 mg Intravenous New Bag/Given 08/21/21 2118)     MDM Rules/Calculators/A&P MDM Patient with recent admission for PUD, has not been eating or drinking well since going home 6 weeks ago. Noted to have loose black stool in the ED, faintly heme positive. Will check labs, including INR and reassess.   ED Course  I have reviewed the triage vital signs and the nursing notes.  Pertinent labs & imaging results  that were available during my care of the patient were reviewed by me and considered in my medical decision making (see chart for details).  Clinical Course as of 08/21/21 2322  Wynelle Link Aug 21, 2021  1918 CMP with hypokalemia, will begin IV repletion. Also has AKI with doubling of Cr since recent admission. Begin LR hydration.  [CS]  2037 WBC is mildly elevated. [CS]  2037 Covid is neg.  [CS]  2037 INR is markedly elevated, will give Vit K per protocol. She is only faintly hemoccult positive with normal Hgb and no hypotension. Doubt this is a massive GI bleed in need of KCentra/FEIBA. Will discuss admission with Hospitalist.  [CS]    Clinical Course User Index [CS] Pollyann Savoy, MD    Final Clinical Impression(s) / ED Diagnoses Final diagnoses:  Generalized weakness  AKI (acute kidney injury) (HCC)  Supratherapeutic INR    Rx / DC Orders ED Discharge Orders     None        Pollyann Savoy, MD 08/21/21 2323

## 2021-08-21 NOTE — H&P (Addendum)
History and Physical    [name] HKV:[id number] DOB: [birth year] DOA: 08/21/2021  PCP: Marylynn Pearson, FNP   Patient coming from: Home  I have personally briefly reviewed patient's old medical records in [facility]  Chief Complaint: Weak, Vomiting  HPI: Kathleen Barnett is a 80 y.o. female with medical history significant for atrial fibrillation on chronic anticoagulation with Coumadin, diabetes mellitus. Was brought to the ED ports of generalized weakness, vomiting, nausea and weight loss.  Daughter also reported patient's INR has been elevated.  The time of my evaluation daughter has left, patient is not exactly sure sure why she was brought to the hospital.  She denies abdominal pain.  Reports generalized weakness.  Patient was admitted 7/19-7/23 for supratherapeutic INR of greater than 10, with melena, symptomatic anemia.  Patient required FFP x1 unit, was given vitamin K.  EGD done showed several gastric ulcers.   ED Course: Stable vitals, blood pressure 105 242.  Globin stable 14.  INR greater than 10.  Potassium 2.6. 1.4.  Stool FOBT positive.  2.5 mg vitamin K given.  Hospitalist to admit.  Review of Systems: As per HPI all other systems reviewed and negative.  Past Medical History:  Diagnosis Date   Diabetes mellitus without complication [facility])     Past Surgical History:  Procedure Laterality Date   ABDOMINAL HYSTERECTOMY     APPENDECTOMY     BIOPSY  07/08/2021   Procedure: BIOPSY;  Surgeon: Dolores Frame, MD;  Location: [facility];  Service: Gastroenterology;;   CATARACT EXTRACTION W/PHACO Right 12/29/2019   Procedure: CATARACT EXTRACTION PHACO AND INTRAOCULAR LENS PLACEMENT RIGHT EYE;  Surgeon: Fabio Pierce, MD;  Location: [facility] ORS;  Service: Ophthalmology;  Laterality: Right;  CDE: 18.10   CATARACT EXTRACTION W/PHACO Left 01/26/2020   Procedure: CATARACT EXTRACTION PHACO AND INTRAOCULAR LENS PLACEMENT (IOC);  Surgeon: Fabio Pierce, MD;  Location:  [facility] ORS;  Service: Ophthalmology;  Laterality: Left;  CDE: 8.22   CHOLECYSTECTOMY     ESOPHAGOGASTRODUODENOSCOPY (EGD) WITH PROPOFOL N/A 07/08/2021   Procedure: ESOPHAGOGASTRODUODENOSCOPY (EGD) WITH PROPOFOL;  Surgeon: Dolores Frame, MD;  Location: [facility];  Service: Gastroenterology;  Laterality: N/A;     reports that she has been smoking cigarettes. She has been smoking an average of .25 packs per day. She has never used smokeless tobacco. She reports that she does not currently use alcohol. She reports that she does not use drugs.  No Known Allergies  Family History  Problem Relation Age of Onset   Colon cancer Neg Hx     Prior to Admission medications   Medication Sig Start Date End Date Taking? Authorizing Provider  albuterol (VENTOLIN HFA) 108 (90 Base) MCG/ACT inhaler Inhale 2 puffs into the lungs every 6 (six) hours as needed for wheezing or shortness of breath.     [provider]  atorvastatin (LIPITOR) 20 MG tablet Take 20 mg by mouth every evening.     [provider]  docusate sodium (COLACE) 100 MG capsule Take 1 capsule (100 mg total) by mouth 2 (two) times daily. 07/09/21 07/09/22  Johnson, [name], MD  donepezil (ARICEPT) 10 MG tablet Take 10 mg by mouth daily.    [provider]  ferrous sulfate 325 (65 FE) MG tablet Take 1 tablet (325 mg total) by mouth daily with breakfast. 07/09/21   [name], [name], MD  isosorbide mononitrate (IMDUR) 30 MG 24 hr tablet Take 30 mg by mouth daily.  [provider]  memantine (NAMENDA) 10 MG tablet Take 10 mg by mouth 2 (two) times daily.    [provider]  metFORMIN (GLUCOPHAGE) 500 MG tablet Take 500 mg by mouth 2 (two) times daily with a meal.    [provider]  omeprazole (PRILOSEC) 40 MG capsule Take 1 capsule (40 mg total) by mouth in the morning and at bedtime. 07/09/21   Johnson, [name], MD  QUEtiapine (SEROQUEL) 25 MG tablet Take 1 tablet (25 mg  total) by mouth at bedtime. 07/09/21   Johnson, [name], MD  traMADol (ULTRAM) 50 MG tablet Take 50 mg by mouth daily as needed (pain).     [provider]  traZODone (DESYREL) 150 MG tablet Take 1 tablet (150 mg total) by mouth at bedtime. 07/09/21   [name], MD  warfarin (COUMADIN) 5 MG tablet Take 1 tablet (5 mg total) by mouth daily at 4 PM. 07/09/21   [name], MD    Physical Exam: Vitals:   08/21/21 2000 08/21/21 2100 08/21/21 2130 08/21/21 2232  BP: 118/80 121/82 123/68 (!) 142/73  Pulse: 61 87 82 81  Resp: (!) 22 17 (!) 22 18  Temp:    97.8 F (36.6 C)  TempSrc:    Oral  SpO2: 97% 98% 97% 98%  Weight:      Height:        Constitutional: NAD, calm, comfortable, obvious of smell of melenaic stool Vitals:   08/21/21 2000 08/21/21 2100 08/21/21 2130 08/21/21 2232  BP: 118/80 121/82 123/68 (!) 142/73  Pulse: 61 87 82 81  Resp: (!) 22 17 (!) 22 18  Temp:    97.8 F (36.6 C)  TempSrc:    Oral  SpO2: 97% 98% 97% 98%  Weight:      Height:       Eyes: PERRL, lids and conjunctivae normal ENMT: Mucous membranes are moist.   Neck: normal, supple, no masses, no thyromegaly Respiratory: clear to auscultation bilaterally, no wheezing, no crackles. Normal respiratory effort. No accessory muscle use.  Cardiovascular: Regular rate and rhythm, no murmurs / rubs / gallops. No extremity edema. 2+ pedal pulses. No carotid bruits.  Abdomen: no tenderness, no masses palpated. No hepatosplenomegaly. Bowel sounds positive.  Musculoskeletal: no clubbing / cyanosis. No joint deformity upper and lower extremities. Good ROM, no contractures. Normal muscle tone.  Skin: no rashes, lesions, ulcers. No induration Neurologic: No apparent cranial nerve abnormality, moving extremities spontaneously. Psychiatric: Normal judgment and insight. Alert and oriented x 3. Normal mood.   Labs on Admission: I have personally reviewed following labs and imaging  studies  CBC: Recent Labs  Lab 08/21/21 1830  WBC 12.5*  NEUTROABS 9.8*  HGB 14.0  HCT 40.5  MCV 87.3  PLT 207   Basic Metabolic Panel: Recent Labs  Lab 08/21/21 1830  NA 132*  K 2.6*  CL 92*  CO2 20*  GLUCOSE 100*  BUN 31*  CREATININE 1.40*  CALCIUM 8.0*   Liver Function Tests: Recent Labs  Lab 08/21/21 1830  AST 28  ALT 16  ALKPHOS 67  BILITOT 1.2  PROT 6.9  ALBUMIN 3.8   Coagulation Profile: Recent Labs  Lab 08/21/21 1830  INR >10.0*   CBG: Recent Labs  Lab 08/21/21 1845  GLUCAP 111*   Radiological Exams on Admission: No results found.  EKG: Independently reviewed.  Sinus tachycardia rate 118.  Rhythm irregular due to missed beat/pause.  LAFB.  Assessment/Plan Principal Problem:  AKI (acute kidney injury) (HCC) Active Problems:   Hypokalemia   Supratherapeutic INR   PUD (peptic ulcer disease)  Supratherapeutic INR with melena- > 10.  History of same.  Melanic stool on arrival to floor. Hemoglobin elevated at 14, compared to discharge hemoglobin of 7.5-1 month ago.  Likely hemoconcentration.  Vitals stable.  History of peptic ulcer disease. - Will give additional 2.5 mg of IV vitamin K, totaling 5 mg  -1 unit FFP - Daily INR -Monitor hemoglobin closely - NPO  AKI-creatinine 1.4, baseline about 0.7.  Likely from vomiting - 1L bolus given, N/s + 20 kcl 100cc/hr x 20 hrs  Anion gap metabolic acidosis, serum bicarb 20, anion gap of 20.  Likely from AKI, possible lactic acidosis from dehydration. -Will check lactic acid -Can give fluids if lactic acid is elevated  Weakness, vomiting, weight loss-abdomen benign.  WBC 12.5.  Weakness likely from poor oral intake, vomiting.  History of peptic ulcer disease.  Hypokalemia, hyponatremia-potassium 2.6 from GI losses.  Magnesium 1.3 -Replete  Peptic ulcer disease-stool FOBT positive.  EGD 06/2021 by Dr. Levon Hedger- Three non-bleeding cratered gastric ulcers with a clean ulcer base (Forrest Class  III) were found in the gastric antrum. The largest lesion was 10 mm in largest dimension. -IV Protonix 80mg  bolus and drip - NPO - GI consult  Controlled diabetes mellitus type 2-A1c 6.5 -Holding metformin - SSI  History of heart valve replacement, atrial fibrillation-rate controlled. -Hold Imdur in the setting of GI blood loss   Cognitive impairment/dementia -N.p.o. -Hold donepezil, Namenda, Seroquel   Hyperlipidemia -Hold statin   DVT prophylaxis: Supratherapeutic INR/SCDs Code Status: Full code Family Communication: None at bedside Disposition Plan:  ~  2days Consults called: None Admission status: Inpt, tele I certify that at the point of admission it is my clinical judgment that the patient will require inpatient hospital care spanning beyond 2 midnights from the point of admission due to high intensity of service, high risk for further deterioration and high frequency of surveillance required.    MD [facility]  08/21/2021, 12:20 AM

## 2021-08-21 NOTE — ED Notes (Signed)
Date and time results received: 08/21/21 1953  Test: INR Critical Value: >10  Name of Provider Notified: Bernette Mayers, MD

## 2021-08-21 NOTE — ED Triage Notes (Signed)
Pt. Arrived via EMS from home with complaints of weakness, N/V and a weight loss of 60 lbs. Per family pt. Was discharged from this facility a month ago and has since not been able to keep any food or liquids down.

## 2021-08-21 NOTE — ED Notes (Signed)
Date and time results received: 08/21/21 1915  Test: Potassium Critical Value: 2.6  Name of Provider Notified: Bernette Mayers, MD

## 2021-08-22 ENCOUNTER — Encounter (HOSPITAL_COMMUNITY): Payer: Self-pay | Admitting: Internal Medicine

## 2021-08-22 DIAGNOSIS — Z953 Presence of xenogenic heart valve: Secondary | ICD-10-CM | POA: Diagnosis not present

## 2021-08-22 DIAGNOSIS — E785 Hyperlipidemia, unspecified: Secondary | ICD-10-CM | POA: Diagnosis present

## 2021-08-22 DIAGNOSIS — R791 Abnormal coagulation profile: Secondary | ICD-10-CM | POA: Diagnosis not present

## 2021-08-22 DIAGNOSIS — E1122 Type 2 diabetes mellitus with diabetic chronic kidney disease: Secondary | ICD-10-CM | POA: Diagnosis present

## 2021-08-22 DIAGNOSIS — R634 Abnormal weight loss: Secondary | ICD-10-CM | POA: Diagnosis present

## 2021-08-22 DIAGNOSIS — K279 Peptic ulcer, site unspecified, unspecified as acute or chronic, without hemorrhage or perforation: Secondary | ICD-10-CM | POA: Diagnosis not present

## 2021-08-22 DIAGNOSIS — R112 Nausea with vomiting, unspecified: Secondary | ICD-10-CM | POA: Diagnosis not present

## 2021-08-22 DIAGNOSIS — I358 Other nonrheumatic aortic valve disorders: Secondary | ICD-10-CM | POA: Diagnosis not present

## 2021-08-22 DIAGNOSIS — N1831 Chronic kidney disease, stage 3a: Secondary | ICD-10-CM | POA: Diagnosis present

## 2021-08-22 DIAGNOSIS — Z90710 Acquired absence of both cervix and uterus: Secondary | ICD-10-CM | POA: Diagnosis not present

## 2021-08-22 DIAGNOSIS — R627 Adult failure to thrive: Secondary | ICD-10-CM | POA: Diagnosis present

## 2021-08-22 DIAGNOSIS — Z20822 Contact with and (suspected) exposure to covid-19: Secondary | ICD-10-CM | POA: Diagnosis present

## 2021-08-22 DIAGNOSIS — E871 Hypo-osmolality and hyponatremia: Secondary | ICD-10-CM | POA: Diagnosis present

## 2021-08-22 DIAGNOSIS — K921 Melena: Secondary | ICD-10-CM | POA: Diagnosis present

## 2021-08-22 DIAGNOSIS — D72829 Elevated white blood cell count, unspecified: Secondary | ICD-10-CM | POA: Diagnosis present

## 2021-08-22 DIAGNOSIS — Z87440 Personal history of urinary (tract) infections: Secondary | ICD-10-CM | POA: Diagnosis not present

## 2021-08-22 DIAGNOSIS — N179 Acute kidney failure, unspecified: Secondary | ICD-10-CM | POA: Diagnosis present

## 2021-08-22 DIAGNOSIS — Z8711 Personal history of peptic ulcer disease: Secondary | ICD-10-CM

## 2021-08-22 DIAGNOSIS — F0391 Unspecified dementia with behavioral disturbance: Secondary | ICD-10-CM | POA: Diagnosis present

## 2021-08-22 DIAGNOSIS — K219 Gastro-esophageal reflux disease without esophagitis: Secondary | ICD-10-CM | POA: Diagnosis present

## 2021-08-22 DIAGNOSIS — I4891 Unspecified atrial fibrillation: Secondary | ICD-10-CM | POA: Diagnosis present

## 2021-08-22 DIAGNOSIS — R531 Weakness: Secondary | ICD-10-CM | POA: Diagnosis present

## 2021-08-22 DIAGNOSIS — E860 Dehydration: Secondary | ICD-10-CM | POA: Diagnosis present

## 2021-08-22 DIAGNOSIS — Z9049 Acquired absence of other specified parts of digestive tract: Secondary | ICD-10-CM | POA: Diagnosis not present

## 2021-08-22 DIAGNOSIS — E872 Acidosis: Secondary | ICD-10-CM | POA: Diagnosis present

## 2021-08-22 DIAGNOSIS — Z8616 Personal history of COVID-19: Secondary | ICD-10-CM | POA: Diagnosis not present

## 2021-08-22 DIAGNOSIS — D6832 Hemorrhagic disorder due to extrinsic circulating anticoagulants: Secondary | ICD-10-CM | POA: Diagnosis present

## 2021-08-22 DIAGNOSIS — E278 Other specified disorders of adrenal gland: Secondary | ICD-10-CM | POA: Diagnosis present

## 2021-08-22 DIAGNOSIS — E876 Hypokalemia: Secondary | ICD-10-CM | POA: Diagnosis present

## 2021-08-22 LAB — MAGNESIUM: Magnesium: 1.3 mg/dL — ABNORMAL LOW (ref 1.7–2.4)

## 2021-08-22 LAB — CBC
HCT: 40.9 % (ref 36.0–46.0)
HCT: 35.3 % — ABNORMAL LOW (ref 36.0–46.0)
Hemoglobin: 13.6 g/dL (ref 12.0–15.0)
Hemoglobin: 12.1 g/dL (ref 12.0–15.0)
MCH: 29.6 pg (ref 26.0–34.0)
MCH: 30.3 pg (ref 26.0–34.0)
MCHC: 33.3 g/dL (ref 30.0–36.0)
MCHC: 34.3 g/dL (ref 30.0–36.0)
MCV: 89.1 fL (ref 80.0–100.0)
MCV: 88.3 fL (ref 80.0–100.0)
Platelets: 170 10*3/uL (ref 150–400)
Platelets: 164 10*3/uL (ref 150–400)
RBC: 4.59 MIL/uL (ref 3.87–5.11)
RBC: 4 MIL/uL (ref 3.87–5.11)
RDW: 14.6 % (ref 11.5–15.5)
RDW: 14.6 % (ref 11.5–15.5)
WBC: 13.7 10*3/uL — ABNORMAL HIGH (ref 4.0–10.5)
WBC: 10.1 10*3/uL (ref 4.0–10.5)
nRBC: 0 % (ref 0.0–0.2)
nRBC: 0 % (ref 0.0–0.2)

## 2021-08-22 LAB — LACTIC ACID, PLASMA
Lactic Acid, Venous: 2.1 mmol/L (ref 0.5–1.9)
Lactic Acid, Venous: 1.3 mmol/L (ref 0.5–1.9)

## 2021-08-22 LAB — BASIC METABOLIC PANEL
Anion gap: 18 — ABNORMAL HIGH (ref 5–15)
BUN: 32 mg/dL — ABNORMAL HIGH (ref 8–23)
CO2: 20 mmol/L — ABNORMAL LOW (ref 22–32)
Calcium: 7.7 mg/dL — ABNORMAL LOW (ref 8.9–10.3)
Chloride: 95 mmol/L — ABNORMAL LOW (ref 98–111)
Creatinine, Ser: 1.44 mg/dL — ABNORMAL HIGH (ref 0.44–1.00)
GFR, Estimated: 37 mL/min — ABNORMAL LOW (ref >60)
Glucose, Bld: 112 mg/dL — ABNORMAL HIGH (ref 70–99)
Potassium: 2.9 mmol/L — ABNORMAL LOW (ref 3.5–5.1)
Sodium: 133 mmol/L — ABNORMAL LOW (ref 135–145)

## 2021-08-22 LAB — PROTIME-INR
INR: 2.8 — ABNORMAL HIGH (ref 0.8–1.2)
Prothrombin Time: 29.2 seconds — ABNORMAL HIGH (ref 11.4–15.2)

## 2021-08-22 LAB — GLUCOSE, CAPILLARY
Glucose-Capillary: 80 mg/dL (ref 70–99)
Glucose-Capillary: 82 mg/dL (ref 70–99)
Glucose-Capillary: 85 mg/dL (ref 70–99)
Glucose-Capillary: 89 mg/dL (ref 70–99)
Glucose-Capillary: 90 mg/dL (ref 70–99)

## 2021-08-22 MED ORDER — SUCRALFATE 1 GM/10ML PO SUSP
1.0000 g | Freq: Three times a day (TID) | ORAL | Status: DC
  Administered 2021-08-22 – 2021-08-26 (×11): 1 g via ORAL
  Filled 2021-08-22 (×11): qty 10

## 2021-08-22 MED ORDER — POTASSIUM CHLORIDE IN NACL 20-0.9 MEQ/L-% IV SOLN: INTRAVENOUS | Status: AC

## 2021-08-22 MED ORDER — PANTOPRAZOLE SODIUM 40 MG IV SOLR
40.0000 mg | Freq: Two times a day (BID) | INTRAVENOUS | Status: DC
  Administered 2021-08-22 – 2021-08-26 (×9): 40 mg via INTRAVENOUS
  Filled 2021-08-22 (×9): qty 40

## 2021-08-22 MED ORDER — INSULIN ASPART 100 UNIT/ML IJ SOLN
0.0000 [IU] | Freq: Three times a day (TID) | INTRAMUSCULAR | Status: DC
  Administered 2021-08-23 – 2021-08-25 (×4): 1 [IU] via SUBCUTANEOUS

## 2021-08-22 MED ORDER — PANTOPRAZOLE 80MG IVPB - SIMPLE MED
80.0000 mg | Freq: Once | INTRAVENOUS | Status: AC
  Administered 2021-08-22: 80 mg via INTRAVENOUS
  Filled 2021-08-22: qty 100

## 2021-08-22 MED ORDER — PANTOPRAZOLE INFUSION (NEW) - SIMPLE MED
8.0000 mg/h | INTRAVENOUS | Status: DC
  Administered 2021-08-22: 8 mg/h via INTRAVENOUS
  Filled 2021-08-22 (×4): qty 100

## 2021-08-22 MED ORDER — SODIUM CHLORIDE 0.9% IV SOLUTION: Freq: Once | INTRAVENOUS | Status: AC

## 2021-08-22 MED ORDER — MAGNESIUM SULFATE 2 GM/50ML IV SOLN
2.0000 g | Freq: Once | INTRAVENOUS | Status: AC
  Administered 2021-08-22: 2 g via INTRAVENOUS
  Filled 2021-08-22: qty 50

## 2021-08-22 MED ORDER — ACETAMINOPHEN 650 MG RE SUPP: 650.0000 mg | Freq: Four times a day (QID) | RECTAL | Status: DC | PRN

## 2021-08-22 MED ORDER — SODIUM CHLORIDE 0.9 % IV SOLN: INTRAVENOUS | Status: DC

## 2021-08-22 MED ORDER — WARFARIN - PHARMACIST DOSING INPATIENT: Freq: Every day | Status: DC

## 2021-08-22 MED ORDER — PANTOPRAZOLE SODIUM 40 MG IV SOLR: 40.0000 mg | Freq: Two times a day (BID) | INTRAVENOUS | Status: DC

## 2021-08-22 MED ORDER — WARFARIN SODIUM 2.5 MG PO TABS
2.5000 mg | ORAL_TABLET | Freq: Once | ORAL | Status: AC
  Administered 2021-08-22: 2.5 mg via ORAL
  Filled 2021-08-22: qty 1

## 2021-08-22 MED ORDER — ACETAMINOPHEN 325 MG PO TABS
650.0000 mg | ORAL_TABLET | Freq: Four times a day (QID) | ORAL | Status: DC | PRN
  Administered 2021-08-25: 650 mg via ORAL
  Filled 2021-08-22: qty 2

## 2021-08-22 MED ORDER — VITAMIN K1 10 MG/ML IJ SOLN
INTRAMUSCULAR | Status: AC
  Filled 2021-08-22: qty 1

## 2021-08-22 MED ORDER — LACTATED RINGERS IV BOLUS
500.0000 mL | Freq: Once | INTRAVENOUS | Status: AC
  Administered 2021-08-22: 500 mL via INTRAVENOUS

## 2021-08-22 NOTE — Consult Note (Signed)
Maylon Peppers, M.D. Gastroenterology & Hepatology                                           Patient Name: Kathleen Barnett Account #: @FLAACCTNO @   MRN: [id number] Admission Date: 08/21/2021 Date of Evaluation:  08/22/2021 Time of Evaluation: 10:56 AM   Referring Physician: Barton Dubois, MD  Chief Complaint: Persistent vomiting, weight loss and melena  HPI:  This is a 80 year old female with medical history of diabetes, GERD, dementia, history of valve replacement on warfarin (unclear which valve was replaced) and recent admission on July 2022 for nonbleeding gastric ulcer x3 in the setting of supratherapeutic INR, who was brought to the hospital after presenting persistent nausea and vomiting with significant weight loss.  The patient states that she was not able to tolerate her food for the last 3 days and was brought to the hospital.  She denied having any abdominal pain but states that she has not been able to eat significantly and they decided to bring her to the hospital.  She does not provide too many details about her symptoms.  The patient is a poor historian given her history of dementia.  I called her daughter today.  She reported that the patient has presented persistent nausea and vomiting a few days after she was discharged from the hospital.  She has also lost close to 20 to 30 pounds as she has not been eating a significant amount of food -states that she has eaten " possibly 24 spoons of food since she was discharged from the hospital".  She has also presented recurrent episodes of nausea and vomiting.  The nausea is present on a daily basis and if she forces herself to eat more she will end up vomiting.  The daughter states that her mother has complained of episodes of diffuse abdominal pain, especially when she eats.  Daughter states that for the last few weeks she has presented very dark stools but only yesterday that the stool was melena x1.  She reported her symptoms worsened as  she had COVID close to 2 weeks ago but did not require any hospitalization.  Denies having any fever, chills, hematochezia, hematemesis, abdominal distention,diarrhea, jaundice, pruritus.  The daughter has been very concerned as she "has not seen her decline so fast in the past and thought that her symptoms were only related to her dementia initially".  When I asked her if she has been seen by her primary care for the symptoms in between her hospitalizations, the daughter reported that she has been seen by the PCP and it was assumed she was having decreased intake due to the dementia (there is no documentation in the medical chart about this).  The daughter states the patient has been compliant with her medication and has been following with the  Coumadin clinic, with her last visit on 08/08/2021, her most recent INR was 1.5.  In the ED, the patient was hemodynamically stable and was afebrile.  Remarkable labs upon admission showed CBC with leukocytosis of 12,500 with a hemoglobin of 14.0 (repeat was 13.6), CMP with a potassium of 2.6, sodium of 132, creatinine 1.4, BUN 31 (renal function worsened from previous admission), magnesium 1.3, normal liver function tests, INR was more than 10, lactic acid was elevated 2.1 with a repeat of 1.3. FOBT +. No imaging was performed.  Patient was given  1 unit FFP and vitamin K IV 5 mg.  Last EGD: 06/2021 A 2 cm hiatal hernia was present. Three non-bleeding cratered gastric ulcers with a clean ulcer base (Forrest Class III) were found in the gastric antrum. The largest lesion was 10 mm in largest dimension. Biopsies were negative for Helicobacter pylori  Localized moderate inflammation characterized by congestion (edema) and erythema was found in the first portion of the duodenum. Last Colonoscopy: unknown.  Past Medical History: SEE CHRONIC ISSSUES: Past Medical History:  Diagnosis Date   Diabetes mellitus without complication ([place])    Past Surgical History:   Past Surgical History:  Procedure Laterality Date   ABDOMINAL HYSTERECTOMY     APPENDECTOMY     BIOPSY  07/08/2021   Procedure: BIOPSY;  Surgeon: Harvel Quale, MD;  Location: [facility];  Service: Gastroenterology;;   CATARACT EXTRACTION W/PHACO Right 12/29/2019   Procedure: CATARACT EXTRACTION PHACO AND INTRAOCULAR LENS PLACEMENT RIGHT EYE;  Surgeon: Baruch Goldmann, MD;  Location: [facility] OR[facility];  Service: Ophthalmology;  Laterality: Right;  CDE: 18.10   CATARACT EXTRACTION W/PHACO Left 01/26/2020   Procedure: CATARACT EXTRACTION PHACO AND INTRAOCULAR LENS PLACEMENT (IOC);  Surgeon: Baruch Goldmann, MD;  Location: [facility];  Service: Ophthalmology;  Laterality: Left;  CDE: 8.22   CHOLECYSTECTOMY     ESOPHAGOGASTRODUODENOSCOPY (EGD) WITH PROPOFOL N/A 07/08/2021   Procedure: ESOPHAGOGASTRODUODENOSCOPY (EGD) WITH PROPOFOL;  Surgeon: Harvel Quale, MD;  [facility];  Service: Gastroenterology;  Laterality: N/A;   Family History:  Family History  Problem Relation Age of Onset   Colon cancer Neg Hx    Social History:  Social History   Tobacco Use   Smoking status: Every Day    Packs/day: 0.25    Types: Cigarettes   Smokeless tobacco: Never  Vaping Use   Vaping Use: Never used  Substance Use Topics   Alcohol use: Not Currently   Drug use: Never    Home Medications:  Prior to Admission medications   Medication Sig Start Date End Date Taking? Authorizing Provider  albuterol (VENTOLIN HFA) 108 (90 Base) MCG/ACT inhaler Inhale 2 puffs into the lungs every 6 (six) hours as needed for wheezing or shortness of breath.    Yes [provider]  atorvastatin (LIPITOR) 20 MG tablet Take 20 mg by mouth every evening.    Yes [provider]  docusate sodium (COLACE) 100 MG capsule Take 1 capsule (100 mg total) by mouth 2 (two) times daily. 07/09/21 07/09/22 Yes Johnson, [name], MD  donepezil (ARICEPT) 10 MG tablet Take 10 mg by mouth daily.   Yes  [provider]  ferrous sulfate 325 (65 FE) MG tablet Take 1 tablet (325 mg total) by mouth daily with breakfast. 07/09/21  Yes Johnson, [name], MD  isosorbide mononitrate (IMDUR) 30 MG 24 hr tablet Take 30 mg by mouth daily.    Yes [provider]  memantine (NAMENDA) 10 MG tablet Take 10 mg by mouth 2 (two) times daily.   Yes [provider]  metFORMIN (GLUCOPHAGE) 500 MG tablet Take 500 mg by mouth 2 (two) times daily with a meal.   Yes [provider]  naproxen (NAPROSYN) 500 MG tablet Take 500 mg by mouth 2 (two) times daily as needed. 08/01/21  Yes [provider]  omeprazole (PRILOSEC) 40 MG capsule Take 1 capsule (40 mg total) by mouth in the morning and at bedtime. 07/09/21  Yes Johnson, [name], MD  QUEtiapine (SEROQUEL) 25 MG tablet  Take 1 tablet (25 mg total) by mouth at bedtime. 07/09/21  Yes Johnson, [name], MD  traMADol (ULTRAM) 50 MG tablet Take 50 mg by mouth daily as needed (pain).    Yes [provider]  traZODone (DESYREL) 150 MG tablet Take 1 tablet (150 mg total) by mouth at bedtime. 07/09/21  Yes Johnson, [name], MD  warfarin (COUMADIN) 5 MG tablet Take 1 tablet (5 mg total) by mouth daily at 4 PM. 07/09/21  Yes Murlean Iba, MD    Inpatient Medications:  Current Facility-Administered Medications:    0.9 % NaCl with KCl 20 mEq/ L  infusion, , Intravenous, Continuous, Emokpae, Ejiroghene E, MD, Last Rate: 100 mL/hr at 08/22/21 0022, New Bag at 08/22/21 0022   acetaminophen (TYLENOL) tablet 650 mg, 650 mg, Oral, Q6H PRN **OR** acetaminophen (TYLENOL) suppository 650 mg, 650 mg, Rectal, Q6H PRN, Emokpae, Ejiroghene E, MD   insulin aspart (novoLOG) injection 0-9 Units, 0-9 Units, Subcutaneous, Q8H, Emokpae, Ejiroghene E, MD   ondansetron (ZOFRAN) injection 4 mg, 4 mg, Intravenous, Q6H PRN, Emokpae, Ejiroghene E, MD, 4 mg at 08/21/21 2359   [START ON 08/25/2021] pantoprazole (PROTONIX) injection 40 mg, 40 mg,  Intravenous, Q12H, Emokpae, Ejiroghene E, MD   pantoprozole (PROTONIX) 80 mg /NS 100 mL infusion, 8 mg/hr, Intravenous, Continuous, Emokpae, Ejiroghene E, MD, Last Rate: 10 mL/hr at 08/22/21 0309, 8 mg/hr at 08/22/21 0309 Allergies: Patient has no known allergies.  Complete Review of Systems: GENERAL: negative for malaise, night sweats HEENT: No changes in hearing or vision, no nose bleeds or other nasal problems. NECK: Negative for lumps, goiter, pain and significant neck swelling RESPIRATORY: Negative for cough, wheezing CARDIOVASCULAR: Negative for chest pain, leg swelling, palpitations, orthopnea GI: SEE HPI MUSCULOSKELETAL: Negative for joint pain or swelling, back pain, and muscle pain. SKIN: Negative for lesions, rash PSYCH: Negative for sleep disturbance, mood disorder and recent psychosocial stressors. HEMATOLOGY Negative for prolonged bleeding, bruising easily, and swollen nodes. ENDOCRINE: Negative for cold or heat intolerance, polyuria, polydipsia and goiter. NEURO: negative for tremor, gait imbalance, syncope and seizures. The remainder of the review of systems is noncontributory.  Physical Exam: BP 115/75 (BP Location: Left Arm)   Pulse 88   Temp (!) 97.5 F (36.4 C) (Oral)   Resp 16   Ht 5' 7"  (1.702 m)   Wt 83.5 kg   SpO2 100%   BMI 28.82 kg/m  GENERAL: The patient is AO x3, in no acute distress. Looks frail. HEENT: Head is normocephalic and atraumatic. EOMI are intact. Mouth is well hydrated and without lesions. NECK: Supple. No masses LUNGS: Clear to auscultation. No presence of rhonchi/wheezing/rales. Adequate chest expansion HEART: RRR, normal s1 and s2. ABDOMEN: Soft, nontender, no guarding, no peritoneal signs, and nondistended. BS +. No masses. EXTREMITIES: Without any cyanosis, clubbing, rash, lesions or edema. NEUROLOGIC: AOx3, no focal motor deficit. SKIN: no jaundice, no rashes  Laboratory Data CBC:     Component Value Date/Time   WBC 10.1  08/22/2021 0522   RBC 4.00 08/22/2021 0522   HGB 12.1 08/22/2021 0522   HCT 35.3 (L) 08/22/2021 0522   PLT 164 08/22/2021 0522   MCV 88.3 08/22/2021 0522   MCH 30.3 08/22/2021 0522   MCHC 34.3 08/22/2021 0522   RDW 14.6 08/22/2021 0522   LYMPHSABS 1.9 08/21/2021 1830   MONOABS 0.6 08/21/2021 1830   EOSABS 0.0 08/21/2021 1830   BASOSABS 0.1 08/21/2021 1830   COAG:  Lab Results  Component Value Date  INR 2.8 (H) 08/22/2021   INR >10.0 (HH) 08/21/2021   INR 1.5 (A) 08/08/2021    BMP:  BMP Latest Ref Rng & Units 08/22/2021 08/21/2021 07/09/2021  Glucose 70 - 99 mg/dL 112(H) 100(H) 103(H)  BUN 8 - 23 mg/dL 32(H) 31(H) 8  Creatinine 0.44 - 1.00 mg/dL 1.44(H) 1.40(H) 0.70  Sodium 135 - 145 mmol/L 133(L) 132(L) 140  Potassium 3.5 - 5.1 mmol/L 2.9(L) 2.6(LL) 3.2(L)  Chloride 98 - 111 mmol/L 95(L) 92(L) 108  CO2 22 - 32 mmol/L 20(L) 20(L) 28  Calcium 8.9 - 10.3 mg/dL 7.7(L) 8.0(L) 8.2(L)    HEPATIC:  Hepatic Function Latest Ref Rng & Units 08/21/2021  Total Protein 6.5 - 8.1 g/dL 6.9  Albumin 3.5 - 5.0 g/dL 3.8  AST 15 - 41 U/L 28  ALT 0 - 44 U/L 16  Alk Phosphatase 38 - 126 U/L 67  Total Bilirubin 0.3 - 1.2 mg/dL 1.2    CARDIAC: No results found for: CKTOTAL, CKMB, CKMBINDEX, TROPONINI   Imaging: I personally reviewed and interpreted the available imaging.  Assessment & Plan: Kathleen Barnett is a 80 year old female with medical history of diabetes, GERD, dementia, history of valve replacement on warfarin (unclear which valve was replaced) and recent admission on July 2022 for nonbleeding gastric ulcer x3 in the setting of supratherapeutic INR, who was brought to the hospital after presenting persistent nausea and vomiting with significant weight loss.  The patient has presented recurrent symptoms since her last hospitalization and has presented with significant weight loss due to poor oral intake and recurrent vomiting.  She has presented progressive symptoms without a clear  explanation.  Due to persistent symptoms she has presented significant dehydration as evidenced by her electrolyte abnormalities and increased lactic acid.  We will need to hydrate her further and keep her on clear liquids for now, advancing her diet as tolerated but also continue symptomatic treatment with Zofran to improve her vomiting episodes.  There is no cross-sectional abdominal imaging available which I consider is warranted to evaluate her symptoms further.  This will need to be performed once her kidney function has improved with hydration.  We will recommend obtaining a CT angio of the abdomen and pelvis given her sitophobia.  If no findings are observed, may consider also performing a CT of the chest to evaluate for other causes of weight loss.  She has been found to have recurrent supratherapeutic INR that was effectively corrected with blood products, has not present any significant drop in her hemoglobin.  We will hold on performing any endoscopic procedures as it is likely she has presented more bleeding episodes due to her elevated INR.  We will switch her to PPI twice daily.  She was previously taking naproxen twice a day which was likely the cause of her bleeding.  We will check for H. pylori serology at this time (had previous negative biopsies while on PPI) and will need to treat if positive but if not then it is likely she has been taking NSAIDs.  It is unclear if she has been able to take her medications compliantly given the persistent vomiting episodes she has presented.  - Continue with Iv hydration with crystalloids - Perform CT angio abdomen and pelvis with IV contrast once renal function improves - Monitor INR daily, keep within therapeutic range - Repeat CBC qday, transfuse if Hb <7 - Pantoprazole 40 mg q12h IVP - Check H. Pylori serology - 2 large bore IV lines - Active T/S -  CLD, advance as tolerated - Zofran PRN for nausea, can try Phenergan if not improving - Avoid  NSAIDs - No plans for endoscopic intervention at this moment  Harvel Quale, MD Gastroenterology and Hepatology [facility]

## 2021-08-22 NOTE — Progress Notes (Addendum)
[facility] night shift telemetry coverage note.  The nursing staff reported that the patient's lactic acid level was 2.1 mmol/L.  She is getting normal saline with KCl 20 mEq 100 mL/h.  I have placed an order to recheck lactic acid 0500.  Sanda Klein, MD.

## 2021-08-22 NOTE — Progress Notes (Signed)
ANTICOAGULATION CONSULT NOTE - Initial Up Consult   Pharmacy Consult for warfarin dosing  Indication: atrial fibrillation with mechanical valve    No Known Allergies    Patient Measurements: Last Weight  Most recent update: 08/21/2021  6:09 PM    Weight  83.5 kg (184 lb)            Body mass index is 28.82 kg/m. Kathleen Barnett               Temp: 97.7 F (36.5 C) (09/05 1256) Temp Source: Oral (09/05 1256) BP: 116/82 (09/05 1256) Pulse Rate: 83 (09/05 1256)  Labs: Recent Labs    08/21/21 1830 08/22/21 0108 08/22/21 0522  HGB 14.0 13.6 12.1  HCT 40.5 40.9 35.3*  PLT 207 170 164  LABPROT >90.0*  --  29.2*  INR >10.0*  --  2.8*  CREATININE 1.40* 1.44*  --     Estimated Creatinine Clearance: 35.8 mL/min (A) (by C-G formula based on SCr of 1.44 mg/dL (H)).     Medications:  Medications Prior to Admission  Medication Sig Dispense Refill Last Dose   albuterol (VENTOLIN HFA) 108 (90 Base) MCG/ACT inhaler Inhale 2 puffs into the lungs every 6 (six) hours as needed for wheezing or shortness of breath.    PRN   atorvastatin (LIPITOR) 20 MG tablet Take 20 mg by mouth every evening.    08/21/2021   docusate sodium (COLACE) 100 MG capsule Take 1 capsule (100 mg total) by mouth 2 (two) times daily. 60 capsule 2 08/21/2021   donepezil (ARICEPT) 10 MG tablet Take 10 mg by mouth daily.   08/21/2021   ferrous sulfate 325 (65 FE) MG tablet Take 1 tablet (325 mg total) by mouth daily with breakfast. 30 tablet 2 08/21/2021   isosorbide mononitrate (IMDUR) 30 MG 24 hr tablet Take 30 mg by mouth daily.    08/21/2021   memantine (NAMENDA) 10 MG tablet Take 10 mg by mouth 2 (two) times daily.   08/21/2021   metFORMIN (GLUCOPHAGE) 500 MG tablet Take 500 mg by mouth 2 (two) times daily with a meal.   08/21/2021   naproxen (NAPROSYN) 500 MG tablet Take 500 mg by mouth 2 (two) times daily as needed.   PRN   omeprazole (PRILOSEC) 40 MG capsule Take 1 capsule (40 mg total) by mouth in the morning and at  bedtime. 60 capsule 3 08/21/2021   QUEtiapine (SEROQUEL) 25 MG tablet Take 1 tablet (25 mg total) by mouth at bedtime. 30 tablet 0 08/20/2021   traMADol (ULTRAM) 50 MG tablet Take 50 mg by mouth daily as needed (pain).    PRN   traZODone (DESYREL) 150 MG tablet Take 1 tablet (150 mg total) by mouth at bedtime.   08/20/2021   warfarin (COUMADIN) 5 MG tablet Take 1 tablet (5 mg total) by mouth daily at 4 PM. 30 tablet 0 08/21/2021 at pm   Scheduled:   insulin aspart  0-9 Units Subcutaneous Q8H   pantoprazole  40 mg Intravenous Q12H   sucralfate  1 g Oral TID   Infusions:   0.9 % NaCl with KCl 20 mEq / L 100 mL/hr at 08/22/21 1325   PRN: acetaminophen **OR** acetaminophen, ondansetron (ZOFRAN) IV Anti-infectives (From admission, onward)    None       Goal of Therapy:  INR 2.5-3.5 Monitor platelets by anticoagulation protocol: Yes    Prior to Admission Warfarin Dosing:  Kathleen Barnett takes 5mg  of warfarin daily  Admit INR was >10.0 Vitamin K given iv 2.5mg  on 9/4 Lab Results  Component Value Date   INR 2.8 (H) 08/22/2021   INR >10.0 (HH) 08/21/2021   INR 1.5 (A) 08/08/2021    Assessment: Kathleen Barnett a 80 y.o. female requires anticoagulation with warfarin for the indication of  atrial fibrillation and mechanical valve . Warfarin will be initiated inpatient following pharmacy protocol per pharmacy consult. Patient most recent blood work is as follows: CBC Latest Ref Rng & Units 08/22/2021 08/22/2021 08/21/2021  WBC 4.0 - 10.5 K/uL 10.1 13.7(H) 12.5(H)  Hemoglobin 12.0 - 15.0 g/dL 99.3 57.0 17.7  Hematocrit 36.0 - 46.0 % 35.3(L) 40.9 40.5  Platelets 150 - 400 K/uL 164 170 207     Plan: Warfarin 2.5mg  po x 1  Monitor CBC MWF with am labs   Monitor INR daily Monitor for signs and symptoms of bleeding   Gerre Pebbles Jayden Rudge, PharmD, MBA, BCGP Clinical Pharmacist

## 2021-08-22 NOTE — Progress Notes (Signed)
Date and time results received: 08/22/21 0212 (use smartphrase ".now" to insert current time)  Test: Lactic acid Critical Value: 2.1  Name of Provider Notified: Dr. Chauncy Passy  Orders Received? Or Actions Taken?: reported

## 2021-08-22 NOTE — Progress Notes (Signed)
PROGRESS NOTE    Kathleen Barnett  ZOX:[id number] DOB: April 13, 1941 DOA: 08/21/2021 PCP: Marylynn Pearson, FNP     Chief Complaint  Patient presents with   Weakness    Brief admission narrative:  As per H&P written by Dr. Mariea Clonts on 08/21/2021 Kathleen Barnett is a 80 y.o. female with medical history significant for atrial fibrillation on chronic anticoagulation with Coumadin, diabetes mellitus. Was brought to the ED ports of generalized weakness, vomiting, nausea and weight loss.  Daughter also reported patient's INR has been elevated.  The time of my evaluation daughter has left, patient is not exactly sure sure why she was brought to the hospital.  She denies abdominal pain.  Reports generalized weakness.   Patient was admitted 7/19-7/23 for supratherapeutic INR of greater than 10, with melena, symptomatic anemia.  Patient required FFP x1 unit, was given vitamin K.  EGD done showed several gastric ulcers.    ED Course: Stable vitals, blood pressure 105 242.  Globin stable 14.  INR greater than 10.  Potassium 2.6. 1.4.  Stool FOBT positive.  2.5 mg vitamin K given.  Hospitalist to admit.    Assessment & Plan: 1-supratherapeutic INR with melena and -With INR more than 10 on presentation -Hemoglobin has remained stable -After expressing plasma and vitamin K INR 2.8 -Continue to follow hemoglobin trend -Gastroenterology service has been consulted and planning conservative management for now no intervention planned. -Clear liquid diet will be allowed and will use Carafate, will continue the use of PPI.  2-history of peptic ulcer disease -Continue avoiding NSAIDs -Maintain INR therapeutic and control -Follow hemoglobin trend -Continue PPI.  3-hypokalemia, hypomagnesemia and hyponatremia -Continue electrolytes repletion -Follow electrolytes trend  4-weakness, vomiting and weight loss -Patient feeling better after fluid resuscitation -Continue to provide repletion -Will ask physical  therapy to evaluate patient.  5-controlled type 2 diabetes mellitus -Continue holding oral hypoglycemic agent -Follow CBGs.  6-history of dementia with behavioral disturbance and -Continue holding Aricept, Namenda and Seroquel for now.  7-hyperlipidemia -Continue holding statin for now.  8-acute kidney injury and metabolic acidosis -In the setting of dehydration and acute GI bleed -Continue IV fluids and follow response. -Repeat lactic acid within normal limits currently.  9-mechanical valve replacement -Continue Coumadin with INR goal 2.5-3.5. -pharmacy to help with coumadin dose.   DVT prophylaxis: Chronically on Coumadin Code Status: Full code Family Communication: No family at bedside Disposition:   Status is: Inpatient  Remains inpatient appropriate because:IV treatments appropriate due to intensity of illness or inability to take PO  Dispo: The patient is from: Home              Anticipated d/c is to: Home              Patient currently is not medically stable to d/c.   Difficult to place patient No       Consultants:  Gastroenterology service  Procedures:  See below for Reports.  Antimicrobials:  None   Subjective: Afebrile, no chest pain, no nausea, no vomiting, no abdominal pain.  Patient reports no operative bleeding overnight.  Objective: Vitals:   08/22/21 0220 08/22/21 0548 08/22/21 1022 08/22/21 1256  BP: 127/76 140/65 115/75 116/82  Pulse: 77 65 88 83  Resp: 19 18 16 18   Temp: 98.1 F (36.7 C) 98.1 F (36.7 C) (!) 97.5 F (36.4 C) 97.7 F (36.5 C)  TempSrc: Oral Oral Oral Oral  SpO2:  99% 100% 100%  Weight:      Height:  Intake/Output Summary (Last 24 hours) at 08/22/2021 1750 Last data filed at 08/22/2021 1500 Gross per 24 hour  Intake 2457.83 ml  Output --  Net 2457.83 ml   Filed Weights   08/21/21 1808  Weight: 83.5 kg    Examination:  General exam: Appears calm and comfortable; reports no abdominal pain, no  nausea, no vomiting and no overt bleeding overnight. Respiratory system: Clear to auscultation. Respiratory effort normal.  Good saturation on room air. Cardiovascular system: Rate controlled, positive mechanical click appreciated; no JVD, no rubs or gallop. Gastrointestinal system: Abdomen is nondistended, soft and nontender. No organomegaly or masses felt. Normal bowel sounds heard. Central nervous system: No focal neurological deficits. Extremities: No cyanosis or clubbing. Skin: No petechiae. Psychiatry: Judgement and insight impaired secondary to underlying dementia.   Data Reviewed: I have personally reviewed following labs and imaging studies  CBC: Recent Labs  Lab 08/21/21 1830 08/22/21 0108 08/22/21 0522  WBC 12.5* 13.7* 10.1  NEUTROABS 9.8*  --   --   HGB 14.0 13.6 12.1  HCT 40.5 40.9 35.3*  MCV 87.3 89.1 88.3  PLT 207 170 164    Basic Metabolic Panel: Recent Labs  Lab 08/21/21 1830 08/22/21 0108  NA 132* 133*  K 2.6* 2.9*  CL 92* 95*  CO2 20* 20*  GLUCOSE 100* 112*  BUN 31* 32*  CREATININE 1.40* 1.44*  CALCIUM 8.0* 7.7*  MG 1.3*  --     GFR: Estimated Creatinine Clearance: 35.8 mL/min (A) (by C-G formula based on SCr of 1.44 mg/dL (H)).  Liver Function Tests: Recent Labs  Lab 08/21/21 1830  AST 28  ALT 16  ALKPHOS 67  BILITOT 1.2  PROT 6.9  ALBUMIN 3.8    CBG: Recent Labs  Lab 08/21/21 1845 08/22/21 0545 08/22/21 0722 08/22/21 1605  GLUCAP 111* 90 80 89    Recent Results (from the past 240 hour(s))  Resp Panel by RT-PCR (Flu A&B, Covid) Nasopharyngeal Swab     Status: None   Collection Time: 08/21/21  7:12 PM   Specimen: Nasopharyngeal Swab; Nasopharyngeal(NP) swabs in vial transport medium  Result Value Ref Range Status   SARS Coronavirus 2 by RT PCR NEGATIVE NEGATIVE Final    Comment: (NOTE) SARS-CoV-2 target nucleic acids are NOT DETECTED.  The SARS-CoV-2 RNA is generally detectable in upper respiratory specimens during the  acute phase of infection. The lowest concentration of SARS-CoV-2 viral copies this assay can detect is 138 copies/mL. A negative result does not preclude SARS-Cov-2 infection and should not be used as the sole basis for treatment or other patient management decisions. A negative result may occur with  improper specimen collection/handling, submission of specimen other than nasopharyngeal swab, presence of viral mutation(s) within the areas targeted by this assay, and inadequate number of viral copies(<138 copies/mL). A negative result must be combined with clinical observations, patient history, and epidemiological information. The expected result is Negative.  Fact Sheet for Patients:  BloggerCourse.com  Fact Sheet for Healthcare Providers:  SeriousBroker.it  This test is no t yet approved or cleared by the [place] FDA and  has been authorized for detection and/or diagnosis of SARS-CoV-2 by FDA under an Emergency Use Authorization (EUA). This EUA will remain  in effect (meaning this test can be used) for the duration of the COVID-19 declaration under Section 564(b)(1) of the Act, 21 U.S.C.section 360bbb-3(b)(1), unless the authorization is terminated  or revoked sooner.       Influenza A by PCR NEGATIVE NEGATIVE Final  Influenza B by PCR NEGATIVE NEGATIVE Final    Comment: (NOTE) The Xpert Xpress SARS-CoV-2/FLU/RSV plus assay is intended as an aid in the diagnosis of influenza from Nasopharyngeal swab specimens and should not be used as a sole basis for treatment. Nasal washings and aspirates are unacceptable for Xpert Xpress SARS-CoV-2/FLU/RSV testing.  Fact Sheet for Patients: BloggerCourse.com  Fact Sheet for Healthcare Providers: SeriousBroker.it  This test is not yet approved or cleared by the [place] FDA and has been authorized for detection and/or  diagnosis of SARS-CoV-2 by FDA under an Emergency Use Authorization (EUA). This EUA will remain in effect (meaning this test can be used) for the duration of the COVID-19 declaration under Section 564(b)(1) of the Act, 21 U.S.C. section 360bbb-3(b)(1), unless the authorization is terminated or revoked.  Performed at [facility], [address] [place], [place] [address]      Radiology Studies: No results found.   Scheduled Meds:  insulin aspart  0-9 Units Subcutaneous Q8H   pantoprazole  40 mg Intravenous Q12H   Continuous Infusions:  0.9 % NaCl with KCl 20 mEq / L 100 mL/hr at 08/22/21 1325     LOS: 0 days    Time spent: 35 minutes   Vassie Loll, MD [facility]   To contact the attending provider between 7A-7P or the covering provider during after hours 7P-7A, please log into the web site [email or web address] and access using universal [place] password for that web site. If you do not have the password, please call the hospital operator.  08/22/2021, 5:50 PM

## 2021-08-23 ENCOUNTER — Inpatient Hospital Stay (HOSPITAL_COMMUNITY): Payer: Medicare Other

## 2021-08-23 ENCOUNTER — Encounter (HOSPITAL_COMMUNITY): Payer: Self-pay | Admitting: Internal Medicine

## 2021-08-23 DIAGNOSIS — R1110 Vomiting, unspecified: Secondary | ICD-10-CM

## 2021-08-23 DIAGNOSIS — R634 Abnormal weight loss: Secondary | ICD-10-CM

## 2021-08-23 LAB — CBC
HCT: 37.4 % (ref 36.0–46.0)
Hemoglobin: 12.4 g/dL (ref 12.0–15.0)
MCH: 29.5 pg (ref 26.0–34.0)
MCHC: 33.2 g/dL (ref 30.0–36.0)
MCV: 89 fL (ref 80.0–100.0)
Platelets: 182 10*3/uL (ref 150–400)
RBC: 4.2 MIL/uL (ref 3.87–5.11)
RDW: 14.8 % (ref 11.5–15.5)
WBC: 7.9 10*3/uL (ref 4.0–10.5)
nRBC: 0 % (ref 0.0–0.2)

## 2021-08-23 LAB — BASIC METABOLIC PANEL
Anion gap: 16 — ABNORMAL HIGH (ref 5–15)
BUN: 16 mg/dL (ref 8–23)
CO2: 20 mmol/L — ABNORMAL LOW (ref 22–32)
Calcium: 7.7 mg/dL — ABNORMAL LOW (ref 8.9–10.3)
Chloride: 101 mmol/L (ref 98–111)
Creatinine, Ser: 0.79 mg/dL (ref 0.44–1.00)
GFR, Estimated: >60 mL/min (ref >60)
Glucose, Bld: 109 mg/dL — ABNORMAL HIGH (ref 70–99)
Potassium: 3.2 mmol/L — ABNORMAL LOW (ref 3.5–5.1)
Sodium: 137 mmol/L (ref 135–145)

## 2021-08-23 LAB — URINALYSIS, ROUTINE W REFLEX MICROSCOPIC
Glucose, UA: NEGATIVE mg/dL
Ketones, ur: >80 mg/dL — AB
Nitrite: NEGATIVE
Specific Gravity, Urine: 1.025 (ref 1.005–1.030)
pH: 6 (ref 5.0–8.0)

## 2021-08-23 LAB — URINALYSIS, MICROSCOPIC (REFLEX)

## 2021-08-23 LAB — GLUCOSE, CAPILLARY
Glucose-Capillary: 76 mg/dL (ref 70–99)
Glucose-Capillary: 94 mg/dL (ref 70–99)
Glucose-Capillary: 128 mg/dL — ABNORMAL HIGH (ref 70–99)

## 2021-08-23 LAB — PROTIME-INR
INR: 2.5 — ABNORMAL HIGH (ref 0.8–1.2)
Prothrombin Time: 26.7 seconds — ABNORMAL HIGH (ref 11.4–15.2)

## 2021-08-23 LAB — MAGNESIUM: Magnesium: 1.2 mg/dL — ABNORMAL LOW (ref 1.7–2.4)

## 2021-08-23 MED ORDER — WARFARIN SODIUM 5 MG PO TABS
5.0000 mg | ORAL_TABLET | Freq: Once | ORAL | Status: AC
  Administered 2021-08-23: 5 mg via ORAL
  Filled 2021-08-23: qty 1

## 2021-08-23 MED ORDER — MAGNESIUM SULFATE 2 GM/50ML IV SOLN
2.0000 g | Freq: Once | INTRAVENOUS | Status: AC
  Administered 2021-08-23: 2 g via INTRAVENOUS
  Filled 2021-08-23: qty 50

## 2021-08-23 MED ORDER — QUETIAPINE FUMARATE 25 MG PO TABS
25.0000 mg | ORAL_TABLET | Freq: Every day | ORAL | Status: DC
  Administered 2021-08-23 – 2021-08-25 (×3): 25 mg via ORAL
  Filled 2021-08-23 (×3): qty 1

## 2021-08-23 MED ORDER — LORAZEPAM 2 MG/ML IJ SOLN
2.0000 mg | Freq: Once | INTRAMUSCULAR | Status: AC
  Administered 2021-08-23: 2 mg via INTRAVENOUS
  Filled 2021-08-23: qty 1

## 2021-08-23 MED ORDER — TRAZODONE HCL 50 MG PO TABS
150.0000 mg | ORAL_TABLET | Freq: Every day | ORAL | Status: DC
  Administered 2021-08-23 – 2021-08-25 (×3): 150 mg via ORAL
  Filled 2021-08-23 (×3): qty 3

## 2021-08-23 MED ORDER — IOHEXOL 350 MG/ML SOLN
100.0000 mL | Freq: Once | INTRAVENOUS | Status: AC | PRN
  Administered 2021-08-23: 100 mL via INTRAVENOUS

## 2021-08-23 MED ORDER — BOOST / RESOURCE BREEZE PO LIQD CUSTOM
1.0000 | Freq: Three times a day (TID) | ORAL | Status: DC
  Administered 2021-08-23 – 2021-08-26 (×9): 1 via ORAL

## 2021-08-23 MED ORDER — DONEPEZIL HCL 5 MG PO TABS
10.0000 mg | ORAL_TABLET | Freq: Every day | ORAL | Status: DC
  Administered 2021-08-23 – 2021-08-25 (×3): 10 mg via ORAL
  Filled 2021-08-23 (×3): qty 2

## 2021-08-23 MED ORDER — ISOSORBIDE MONONITRATE ER 60 MG PO TB24
30.0000 mg | ORAL_TABLET | Freq: Every day | ORAL | Status: DC
  Administered 2021-08-23 – 2021-08-26 (×4): 30 mg via ORAL
  Filled 2021-08-23 (×4): qty 1

## 2021-08-23 MED ORDER — MEMANTINE HCL 10 MG PO TABS
10.0000 mg | ORAL_TABLET | Freq: Two times a day (BID) | ORAL | Status: DC
  Administered 2021-08-23 – 2021-08-26 (×6): 10 mg via ORAL
  Filled 2021-08-23 (×6): qty 1

## 2021-08-23 MED ORDER — POTASSIUM CHLORIDE IN NACL 20-0.9 MEQ/L-% IV SOLN: INTRAVENOUS | Status: AC

## 2021-08-23 MED ORDER — PROSOURCE PLUS PO LIQD
30.0000 mL | Freq: Three times a day (TID) | ORAL | Status: DC
  Administered 2021-08-23 – 2021-08-26 (×8): 30 mL via ORAL
  Filled 2021-08-23 (×9): qty 30

## 2021-08-23 MED ORDER — ADULT MULTIVITAMIN W/MINERALS CH
1.0000 | ORAL_TABLET | Freq: Every day | ORAL | Status: DC
  Administered 2021-08-23 – 2021-08-26 (×4): 1 via ORAL
  Filled 2021-08-23 (×4): qty 1

## 2021-08-23 NOTE — Progress Notes (Signed)
Patient has Normal Saline with 20K infusing at 100 ml/hr that is due to stop infusing. Patient's last potassium 2.9. Contacted Dr.Zierle-Ghosh. Order received to continue IVF for 10 more hours.

## 2021-08-23 NOTE — Progress Notes (Signed)
Patients bed alarm going off. Upon entering room patient sitting on side of the bed stating "I need to go next door that's where I live." Patient tearful and anxious. Patient assisted back in bed, attempted to reorient patient. Patient still very upset while resting in bed. Patient heart rate ranging from 90's to 140's. Running afib on the monitor. Notified Dr. Carren Rang. Ativan 2mg  IV once now ordered.

## 2021-08-23 NOTE — Progress Notes (Signed)
ANTICOAGULATION CONSULT NOTE -    Pharmacy Consult for warfarin dosing  Indication: atrial fibrillation with mechanical valve    No Known Allergies    Patient Measurements: Last Weight  Most recent update: 08/21/2021  6:09 PM    Weight  83.5 kg (184 lb)            Body mass index is 28.82 kg/m. MARGARITE VESSEL               Temp: 97.6 F (36.4 C) (09/06 0453) Temp Source: Oral (09/06 0453) BP: 129/92 (09/06 0453) Pulse Rate: 95 (09/06 0453)  Labs: Recent Labs    08/21/21 1830 08/22/21 0108 08/22/21 0522 08/23/21 0553  HGB 14.0 13.6 12.1 12.4  HCT 40.5 40.9 35.3* 37.4  PLT 207 170 164 182  LABPROT >90.0*  --  29.2* 26.7*  INR >10.0*  --  2.8* 2.5*  CREATININE 1.40* 1.44*  --  0.79     Estimated Creatinine Clearance: 64.4 mL/min (by C-G formula based on SCr of 0.79 mg/dL).     Medications:  Medications Prior to Admission  Medication Sig Dispense Refill Last Dose   albuterol (VENTOLIN HFA) 108 (90 Base) MCG/ACT inhaler Inhale 2 puffs into the lungs every 6 (six) hours as needed for wheezing or shortness of breath.    PRN   atorvastatin (LIPITOR) 20 MG tablet Take 20 mg by mouth every evening.    08/21/2021   docusate sodium (COLACE) 100 MG capsule Take 1 capsule (100 mg total) by mouth 2 (two) times daily. 60 capsule 2 08/21/2021   donepezil (ARICEPT) 10 MG tablet Take 10 mg by mouth daily.   08/21/2021   ferrous sulfate 325 (65 FE) MG tablet Take 1 tablet (325 mg total) by mouth daily with breakfast. 30 tablet 2 08/21/2021   isosorbide mononitrate (IMDUR) 30 MG 24 hr tablet Take 30 mg by mouth daily.    08/21/2021   memantine (NAMENDA) 10 MG tablet Take 10 mg by mouth 2 (two) times daily.   08/21/2021   metFORMIN (GLUCOPHAGE) 500 MG tablet Take 500 mg by mouth 2 (two) times daily with a meal.   08/21/2021   naproxen (NAPROSYN) 500 MG tablet Take 500 mg by mouth 2 (two) times daily as needed.   PRN   omeprazole (PRILOSEC) 40 MG capsule Take 1 capsule (40 mg total) by mouth in  the morning and at bedtime. 60 capsule 3 08/21/2021   QUEtiapine (SEROQUEL) 25 MG tablet Take 1 tablet (25 mg total) by mouth at bedtime. 30 tablet 0 08/20/2021   traMADol (ULTRAM) 50 MG tablet Take 50 mg by mouth daily as needed (pain).    PRN   traZODone (DESYREL) 150 MG tablet Take 1 tablet (150 mg total) by mouth at bedtime.   08/20/2021   warfarin (COUMADIN) 5 MG tablet Take 1 tablet (5 mg total) by mouth daily at 4 PM. 30 tablet 0 08/21/2021 at pm   Scheduled:   insulin aspart  0-9 Units Subcutaneous Q8H   pantoprazole  40 mg Intravenous Q12H   sucralfate  1 g Oral TID   Warfarin - Pharmacist Dosing Inpatient   Does not apply q1600   Infusions:   0.9 % NaCl with KCl 20 mEq / L 100 mL/hr at 08/23/21 0009   PRN: acetaminophen **OR** acetaminophen, ondansetron (ZOFRAN) IV Anti-infectives (From admission, onward)    None       Goal of Therapy:  INR 2.5-3.5 Monitor platelets by anticoagulation protocol: Yes  Prior to Admission Warfarin Dosing:  Kathleen Barnett takes 5mg  of warfarin daily   Admit INR was >10.0 Vitamin K given iv 2.5mg  on 9/4  Lab Results  Component Value Date   INR 2.5 (H) 08/23/2021   INR 2.8 (H) 08/22/2021   INR >10.0 (HH) 08/21/2021    Assessment: Kathleen Barnett a 80 y.o. female requires anticoagulation with warfarin for the indication of  atrial fibrillation and mechanical valve . Warfarin will be initiated inpatient following pharmacy protocol per pharmacy consult. Patient most recent blood work is as follows: CBC Latest Ref Rng & Units 08/23/2021 08/22/2021 08/22/2021  WBC 4.0 - 10.5 K/uL 7.9 10.1 13.7(H)  Hemoglobin 12.0 - 15.0 g/dL 10/22/2021 31.2 81.1  Hematocrit 36.0 - 46.0 % 37.4 35.3(L) 40.9  Platelets 150 - 400 K/uL 182 164 170   Per anti-coag clinic on 08/08/21 patient is on 5mg  Warfarin ever Monday and Friday, and and 7.5mg  all other days. INR trending down with Vit. K . INR is therapeutic today  Plan: Warfarin 5mg  po x 1  Monitor CBC MWF with am labs    Monitor INR daily Monitor for signs and symptoms of bleeding   Tuesday, BS Tuesday, BCPS Clinical Pharmacist Pager [phone]

## 2021-08-23 NOTE — Progress Notes (Addendum)
Subjective: Confused.  Think she is at [employer] making eggs.  Daughter states she is talking "out of her head" today.  History of the same in the setting of UTI.   Patient denies nausea, vomiting, abdominal pain.  Apprehensive about eating much of her lunch at the time of my visit.  Spoke with nursing staff who states she ate all of her clear liquid breakfast without hesitancy.  No vomiting.  She had a loose BM today, but no watery stools.  Stool is dark brown.  Daughter states she has been vomiting after chewing her food up, even prior to swallowing.  States she had only eaten about 10 bites of food since her hospital discharge over a month ago.  Also reports she was having diarrhea with 3-4 bowel movements daily and nocturnal stools.  Objective: Vital signs in last 24 hours: Temp:  [97.6 F (36.4 C)-97.8 F (36.6 C)] 97.6 F (36.4 C) (09/06 0453) Pulse Rate:  [43-95] 95 (09/06 0453) Resp:  [15-22] 22 (09/06 0453) BP: (116-138)/(71-92) 129/92 (09/06 0453) SpO2:  [97 %-100 %] 100 % (09/06 0453) Last BM Date: 08/21/21 General:   Alert, pleasantly confused, no acute distress. Head:  Normocephalic and atraumatic. Eyes:  No icterus, sclera clear. Conjuctiva pink.  Abdomen:  Bowel sounds present, soft, non-tender, non-distended. No HSM or hernias noted. No rebound or guarding. No masses appreciated  Msk:  Symmetrical without gross deformities. Normal posture. Extremities:  Without edema. Neurologic:  Alert.  Pleasantly confused.  Only able to tell me her name. Skin:  Warm and dry, intact without significant lesions.  Psych:  Normal mood and affect.  Intake/Output from previous day: 09/05 0701 - 09/06 0700 In: 2262.2 [P.O.:120; I.V.:1630.9; IV Piggyback:511.3] Out: 250 [Urine:250] Intake/Output this shift: Total I/O In: 353.4 [I.V.:353.4] Out: -   Lab Results: Recent Labs    08/22/21 0108 08/22/21 0522 08/23/21 0553  WBC 13.7* 10.1 7.9  HGB 13.6 12.1 12.4  HCT 40.9 35.3*  37.4  PLT 170 164 182   BMET Recent Labs    08/21/21 1830 08/22/21 0108 08/23/21 0553  NA 132* 133* 137  K 2.6* 2.9* 3.2*  CL 92* 95* 101  CO2 20* 20* 20*  GLUCOSE 100* 112* 109*  BUN 31* 32* 16  CREATININE 1.40* 1.44* 0.79  CALCIUM 8.0* 7.7* 7.7*   LFT Recent Labs    08/21/21 1830  PROT 6.9  ALBUMIN 3.8  AST 28  ALT 16  ALKPHOS 67  BILITOT 1.2   PT/INR Recent Labs    08/22/21 0522 08/23/21 0553  LABPROT 29.2* 26.7*  INR 2.8* 2.5*    Assessment: 80 year old female with history of diabetes, GERD, dementia, mechanical heart valve replacement on warfarin, recent admission in July 2022 for nonbleeding gastric ulcer x3 in the setting of supratherapeutic INR, who was brought to the emergency room due to persistent nausea, vomiting, and significant weight loss (documented 15 lbs weight loss over the last 1.5 months).  Per patient's daughter's report, this has been a persistent issue since her last hospital discharge.  Vomiting occurs while chewing food, prior to swallowing.  Apprehensive to eat.  Due to her persistent vomiting/lack of intake, she was found to have AKI and electrolyte abnormalities including hypokalemia, hyponatremia, hypomagnesemia.  Etiology of persistent nausea/vomiting is unclear.  No cross-sectional imaging on file.  Recent EGD with gastric ulcers, no evidence of gastric outlet obstruction.  She does have history of diabetes and thus could have component of gastroparesis; however, vomiting is  occurring even prior to swallowing her food.  Dr. Levon Hedger had recommended CT angio A/P once AKI improved.  Kidney function now normalized with IV fluids, thus we will proceed with imaging today. Spoke with Dr. Levon Hedger today who also recommended CT Chest to evaluate rapid weight loss. This will be performed today as well. Encouragingly, she has had some clinical improvement and seems to be tolerating clear liquids today without vomiting episodes. Will continue clear  liquid diet today and supportive measures.   Supratherapeutic INR: Recurrent supratherapeutic INR >10 on admission.  This has been corrected with blood products.  Encouragingly, she has not had a significant drop in her hemoglobin although her daughter reports patient's stools have been dark.  May very well have some oozing from known gastric ulcer in the setting of supratherapeutic INR and possible naproxen use, but she has also been taking iron which may account for dark stools.  No need for endoscopic evaluation at this time.  H. pylori serologies were checked this admission (previous biopsies negative on PPI).  We will need to follow-up on this.  Altered mental status: In the setting of dementia though daughter reports acute worsening today, similar to symptoms with prior UTI.  We will check UA/urine culture.  Hyponatremia: Resolved with IV fluids.  Hypokalemia: Improving.  Potassium 3.2 today.  Management per hospitalist.  Hypomagnesemia: Persistent.  Magnesium 1.2 today.  Management per hospitalist.  Diarrhea: Patient's daughter reports watery diarrhea prior to admission with 3-4 BMs daily as well as nocturnal stools.  No significant diarrhea this admission.  Continue to monitor.  Plan: CT angio abdomen and pelvis with/without contrast. CT chest with contrast. UA/urine culture. Continue clear liquid diet for now.  Consider advancing tomorrow. Monitor for recurrent nausea/vomiting. Continue PPI twice daily. Continue Zofran as needed. Continue to monitor H/H and for overt GI bleeding. Strict NSAID avoidance. Follow-up H. pylori serology. No need for endoscopic intervention at this time. Monitor for diarrhea. Correction of electrolyte abnormalities per hospitalist.    LOS: 1 day    08/23/2021, 12:22 PM   Ermalinda Memos, [facility]

## 2021-08-23 NOTE — Progress Notes (Signed)
Initial Nutrition Assessment  DOCUMENTATION CODES:   Not applicable  INTERVENTION:  Boost Breeze po TID, each supplement provides 250 kcal and 9 grams of protein   Follow diet advancement and po's  NUTRITION DIAGNOSIS:   Inadequate oral intake related to lethargy/confusion, vomiting, nausea (N/V-PTA) as evidenced by meal completion < 25%.   GOAL:  Patient will meet greater than or equal to 90% of their needs   MONITOR:  Diet advancement, Weight trends, Labs  REASON FOR ASSESSMENT:   Malnutrition Screening Tool    ASSESSMENT: Patient is a 80 yo female with complaints of weakness N/V and weight loss. Patient confused overnight per nursing. Atrial fibrillation with mechanical valve, DM2. Tobacco smoker.   Diet advanced to clears 0% intake at this time. Patient sitting up in bed during RD visit. Pleasantly confused. Unable to provide nutrition history and reports her usual weight as $1.98.   Per Chart : 07/07/21-81 kg and currently 83.5 kg.   Scheduled Meds:  insulin aspart  0-9 Units Subcutaneous Q8H   pantoprazole  40 mg Intravenous Q12H   sucralfate  1 g Oral TID   Warfarin - Pharmacist Dosing Inpatient   Does not apply q1600   Continuous Infusions:  0.9 % NaCl with KCl 20 mEq / L 100 mL/hr at 08/23/21 0009   PRN Meds:.acetaminophen **OR** acetaminophen, ondansetron (ZOFRAN) IV:  Labs: BMP Latest Ref Rng & Units 08/23/2021 08/22/2021 08/21/2021  Glucose 70 - 99 mg/dL 417(E) 081(K) 481(E)  BUN 8 - 23 mg/dL 16 56(D) 14(H)  Creatinine 0.44 - 1.00 mg/dL 7.02 6.37(C) 5.88(F)  Sodium 135 - 145 mmol/L 137 133(L) 132(L)  Potassium 3.5 - 5.1 mmol/L 3.2(L) 2.9(L) 2.6(LL)  Chloride 98 - 111 mmol/L 101 95(L) 92(L)  CO2 22 - 32 mmol/L 20(L) 20(L) 20(L)  Calcium 8.9 - 10.3 mg/dL 7.7(L) 7.7(L) 8.0(L)      NUTRITION - FOCUSED PHYSICAL EXAM:  Nutrition-Focused physical exam completed. Findings are no fat depletion, mild temporal muscle depletion, and no edema.     Diet Order:    Diet Order             Diet clear liquid Room service appropriate? Yes; Fluid consistency: Thin  Diet effective now                   EDUCATION NEEDS:  Not appropriate for education at this time  Skin:  Skin Assessment: Reviewed RN Assessment  Last BM:  9/6 type 7 medium  Height:   Ht Readings from Last 1 Encounters:  08/21/21 5\' 7"  (1.702 m)    Weight:   Wt Readings from Last 1 Encounters:  08/21/21 83.5 kg    Ideal Body Weight:   61 kg  BMI:  Body mass index is 28.82 kg/m.  Estimated Nutritional Needs:   Kcal:  1725-1850  Protein:  89-95  Fluid:  >1.6 liters daily   10/21/21 MS,RD,CSG,LDN Contact: Royann Shivers

## 2021-08-23 NOTE — Progress Notes (Signed)
PROGRESS NOTE    AI SONNENFELD  NWG:[id number] DOB: 12-08-1941 DOA: 08/21/2021 PCP: Marylynn Pearson, FNP     Chief Complaint  Patient presents with   Weakness    Brief admission narrative:  As per H&P written by Dr. Mariea Clonts on 08/21/2021 Kathleen Barnett is a 80 y.o. female with medical history significant for atrial fibrillation on chronic anticoagulation with Coumadin, diabetes mellitus. Was brought to the ED ports of generalized weakness, vomiting, nausea and weight loss.  Daughter also reported patient's INR has been elevated.  The time of my evaluation daughter has left, patient is not exactly sure sure why she was brought to the hospital.  She denies abdominal pain.  Reports generalized weakness.   Patient was admitted 7/19-7/23 for supratherapeutic INR of greater than 10, with melena, symptomatic anemia.  Patient required FFP x1 unit, was given vitamin K.  EGD done showed several gastric ulcers.    ED Course: Stable vitals, blood pressure 105 242.  Globin stable 14.  INR greater than 10.  Potassium 2.6. 1.4.  Stool FOBT positive.  2.5 mg vitamin K given.  Hospitalist to admit.    Assessment & Plan: 1-supratherapeutic INR with melena and -With INR more than 10 on presentation -Hemoglobin has remained stable -After expressing plasma and vitamin K INR 2.8 -Continue to follow hemoglobin trend -Gastroenterology service has been consulted and planning conservative management for now no endoscopic intervention planned. Per GI also, CT Graham of abdomen and pelvis with and without contrast will be pursuit.  Also CT chest with contrast will be check. -Clear liquid diet will be allowed and will continue the use of PPI.  2-history of peptic ulcer disease -Continue avoiding NSAIDs. -Maintain INR therapeutic and control. -Continue to follow hemoglobin trend; no transfusion needed. -Continue PPI.  3-hypokalemia, hypomagnesemia and hyponatremia -Continue electrolytes repletion. -Follow  electrolytes trend.  4-weakness, vomiting and weight loss -Patient feeling better after fluid resuscitation. -Continue to provide repletion. -Will ask physical therapy to evaluate patient.  5-controlled type 2 diabetes mellitus -Continue holding oral hypoglycemic agent -Follow CBGs.  6-history of dementia with behavioral disturbance and high risk for hospital-acquired delirium. -Resume Aricept, Namenda and Seroquel. -Resume the use of trazodone at nighttime -Continue constant reorientation.  7-hyperlipidemia -Continue holding statin for now.  8-acute kidney injury and metabolic acidosis -In the setting of dehydration and acute GI bleed -Continue IV fluids and follow response. -Lactic acid within normal limits now; improvement in metabolic acidosis appreciated. -Continue supportive care.  9-mechanical valve replacement -Continue Coumadin with INR goal 2.5-3.5. -pharmacy to help with coumadin dose.   DVT prophylaxis: Chronically on Coumadin Code Status: Full code Family Communication: No family at bedside Disposition:   Status is: Inpatient  Remains inpatient appropriate because:IV treatments appropriate due to intensity of illness or inability to take PO  Dispo: The patient is from: Home              Anticipated d/c is to: Home              Patient currently is not medically stable to d/c.   Difficult to place patient No       Consultants:  Gastroenterology service  Procedures:  See below for Reports.  Antimicrobials:  None   Subjective: Patient with significant difficulty falling asleep overnight; restless trying to jump out of a and pulling IV out.  Denies chest pain, no shortness of breath, nausea, or vomiting.  No overt bleeding.  Objective: Vitals:   08/22/21 1256 08/22/21  2049 08/23/21 0453 08/23/21 1318  BP: 116/82 138/71 (!) 129/92 (!) 129/93  Pulse: 83 (!) 43 95 86  Resp: 18 15 (!) 22   Temp: 97.7 F (36.5 C) 97.8 F (36.6 C) 97.6 F (36.4  C) (!) 97.5 F (36.4 C)  TempSrc: Oral Oral Oral Oral  SpO2: 100% 97% 100% 100%  Weight:      Height:        Intake/Output Summary (Last 24 hours) at 08/23/2021 1507 Last data filed at 08/23/2021 1300 Gross per 24 hour  Intake 1216.32 ml  Output 250 ml  Net 966.32 ml   Filed Weights   08/21/21 1808  Weight: 83.5 kg    Examination: General exam: Pleasantly confused, no chest pain, no nausea, no vomiting.  Tolerating clear liquid diet.  No overt bleeding.  Patient is afebrile.  Denies shortness of breath. Respiratory system: Clear to auscultation. Respiratory effort normal.  No requiring oxygen supplementation. Cardiovascular system: Rate controlled; positive mechanical click appreciated; no JVD, no rubs, no gallops. Gastrointestinal system: Abdomen is nondistended, soft and nontender. No organomegaly or masses felt. Normal bowel sounds heard. Central nervous system: Alert and oriented. No focal neurological deficits. Extremities: No cyanosis or clubbing; no edema. Skin: No petechiae. Psychiatry: Judgement and insight impaired in the setting of underlying dementia and with high concerns for hospital-acquired delirium.  Data Reviewed: I have personally reviewed following labs and imaging studies  CBC: Recent Labs  Lab 08/21/21 1830 08/22/21 0108 08/22/21 0522 08/23/21 0553  WBC 12.5* 13.7* 10.1 7.9  NEUTROABS 9.8*  --   --   --   HGB 14.0 13.6 12.1 12.4  HCT 40.5 40.9 35.3* 37.4  MCV 87.3 89.1 88.3 89.0  PLT 207 170 164 182    Basic Metabolic Panel: Recent Labs  Lab 08/21/21 1830 08/22/21 0108 08/23/21 0553  NA 132* 133* 137  K 2.6* 2.9* 3.2*  CL 92* 95* 101  CO2 20* 20* 20*  GLUCOSE 100* 112* 109*  BUN 31* 32* 16  CREATININE 1.40* 1.44* 0.79  CALCIUM 8.0* 7.7* 7.7*  MG 1.3*  --  1.2*    GFR: Estimated Creatinine Clearance: 64.4 mL/min (by C-G formula based on SCr of 0.79 mg/dL).  Liver Function Tests: Recent Labs  Lab 08/21/21 1830  AST 28  ALT 16   ALKPHOS 67  BILITOT 1.2  PROT 6.9  ALBUMIN 3.8    CBG: Recent Labs  Lab 08/22/21 1605 08/22/21 2051 08/22/21 2349 08/23/21 0455 08/23/21 0827  GLUCAP 89 85 82 76 94    Recent Results (from the past 240 hour(s))  Resp Panel by RT-PCR (Flu A&B, Covid) Nasopharyngeal Swab     Status: None   Collection Time: 08/21/21  7:12 PM   Specimen: Nasopharyngeal Swab; Nasopharyngeal(NP) swabs in vial transport medium  Result Value Ref Range Status   SARS Coronavirus 2 by RT PCR NEGATIVE NEGATIVE Final    Comment: (NOTE) SARS-CoV-2 target nucleic acids are NOT DETECTED.  The SARS-CoV-2 RNA is generally detectable in upper respiratory specimens during the acute phase of infection. The lowest concentration of SARS-CoV-2 viral copies this assay can detect is 138 copies/mL. A negative result does not preclude SARS-Cov-2 infection and should not be used as the sole basis for treatment or other patient management decisions. A negative result may occur with  improper specimen collection/handling, submission of specimen other than nasopharyngeal swab, presence of viral mutation(s) within the areas targeted by this assay, and inadequate number of viral copies(<138 copies/mL). A negative  result must be combined with clinical observations, patient history, and epidemiological information. The expected result is Negative.  Fact Sheet for Patients:  BloggerCourse.com  Fact Sheet for Healthcare Providers:  SeriousBroker.it  This test is no t yet approved or cleared by the [place] FDA and  has been authorized for detection and/or diagnosis of SARS-CoV-2 by FDA under an Emergency Use Authorization (EUA). This EUA will remain  in effect (meaning this test can be used) for the duration of the COVID-19 declaration under Section 564(b)(1) of the Act, 21 U.S.C.section 360bbb-3(b)(1), unless the authorization is terminated  or revoked sooner.        Influenza A by PCR NEGATIVE NEGATIVE Final   Influenza B by PCR NEGATIVE NEGATIVE Final    Comment: (NOTE) The Xpert Xpress SARS-CoV-2/FLU/RSV plus assay is intended as an aid in the diagnosis of influenza from Nasopharyngeal swab specimens and should not be used as a sole basis for treatment. Nasal washings and aspirates are unacceptable for Xpert Xpress SARS-CoV-2/FLU/RSV testing.  Fact Sheet for Patients: BloggerCourse.com  Fact Sheet for Healthcare Providers: SeriousBroker.it  This test is not yet approved or cleared by the [place] FDA and has been authorized for detection and/or diagnosis of SARS-CoV-2 by FDA under an Emergency Use Authorization (EUA). This EUA will remain in effect (meaning this test can be used) for the duration of the COVID-19 declaration under Section 564(b)(1) of the Act, 21 U.S.C. section 360bbb-3(b)(1), unless the authorization is terminated or revoked.  Performed at [facility], [address] [place], [place] [address]      Radiology Studies: No results found.   Scheduled Meds:  (feeding supplement) PROSource Plus  30 mL Oral TID BM   donepezil  10 mg Oral Daily   feeding supplement  1 Container Oral TID BM   insulin aspart  0-9 Units Subcutaneous Q8H   isosorbide mononitrate  30 mg Oral Daily   memantine  10 mg Oral BID   multivitamin with minerals  1 tablet Oral Daily   pantoprazole  40 mg Intravenous Q12H   QUEtiapine  25 mg Oral QHS   sucralfate  1 g Oral TID   traZODone  150 mg Oral QHS   warfarin  5 mg Oral ONCE-1600   Warfarin - Pharmacist Dosing Inpatient   Does not apply q1600   Continuous Infusions:  magnesium sulfate bolus IVPB       LOS: 1 day    Time spent: 30 minutes   Vassie Loll, MD [facility]   To contact the attending provider between 7A-7P or the covering provider during after hours 7P-7A, please log into the web site [email or web address]  and access using universal [place] password for that web site. If you do not have the password, please call the hospital operator.  08/23/2021, 3:07 PM

## 2021-08-24 DIAGNOSIS — R112 Nausea with vomiting, unspecified: Secondary | ICD-10-CM

## 2021-08-24 DIAGNOSIS — K279 Peptic ulcer, site unspecified, unspecified as acute or chronic, without hemorrhage or perforation: Secondary | ICD-10-CM

## 2021-08-24 DIAGNOSIS — R634 Abnormal weight loss: Secondary | ICD-10-CM

## 2021-08-24 LAB — BASIC METABOLIC PANEL
Anion gap: 9 (ref 5–15)
BUN: 12 mg/dL (ref 8–23)
CO2: 24 mmol/L (ref 22–32)
Calcium: 7.2 mg/dL — ABNORMAL LOW (ref 8.9–10.3)
Chloride: 103 mmol/L (ref 98–111)
Creatinine, Ser: 0.72 mg/dL (ref 0.44–1.00)
GFR, Estimated: >60 mL/min (ref >60)
Glucose, Bld: 86 mg/dL (ref 70–99)
Potassium: 2.2 mmol/L — CL (ref 3.5–5.1)
Sodium: 136 mmol/L (ref 135–145)

## 2021-08-24 LAB — CBC
HCT: 34.2 % — ABNORMAL LOW (ref 36.0–46.0)
Hemoglobin: 11.6 g/dL — ABNORMAL LOW (ref 12.0–15.0)
MCH: 29.4 pg (ref 26.0–34.0)
MCHC: 33.9 g/dL (ref 30.0–36.0)
MCV: 86.6 fL (ref 80.0–100.0)
Platelets: 168 10*3/uL (ref 150–400)
RBC: 3.95 MIL/uL (ref 3.87–5.11)
RDW: 14.7 % (ref 11.5–15.5)
WBC: 6.6 10*3/uL (ref 4.0–10.5)
nRBC: 0 % (ref 0.0–0.2)

## 2021-08-24 LAB — TSH: TSH: 3.077 u[IU]/mL (ref 0.350–4.500)

## 2021-08-24 LAB — GLUCOSE, CAPILLARY
Glucose-Capillary: 96 mg/dL (ref 70–99)
Glucose-Capillary: 131 mg/dL — ABNORMAL HIGH (ref 70–99)
Glucose-Capillary: 149 mg/dL — ABNORMAL HIGH (ref 70–99)

## 2021-08-24 LAB — H. PYLORI ANTIBODY, IGG: H Pylori IgG: 0.59 Index Value (ref 0.00–0.79)

## 2021-08-24 LAB — FOLATE: Folate: 9.5 ng/mL (ref >5.9)

## 2021-08-24 LAB — VITAMIN B12: Vitamin B-12: 547 pg/mL (ref 180–914)

## 2021-08-24 LAB — T4, FREE: Free T4: 1.39 ng/dL — ABNORMAL HIGH (ref 0.61–1.12)

## 2021-08-24 LAB — PROTIME-INR
INR: 3.5 — ABNORMAL HIGH (ref 0.8–1.2)
Prothrombin Time: 34.9 seconds — ABNORMAL HIGH (ref 11.4–15.2)

## 2021-08-24 LAB — MAGNESIUM: Magnesium: 1.5 mg/dL — ABNORMAL LOW (ref 1.7–2.4)

## 2021-08-24 MED ORDER — MAGNESIUM SULFATE 2 GM/50ML IV SOLN
2.0000 g | Freq: Once | INTRAVENOUS | Status: AC
  Administered 2021-08-24: 2 g via INTRAVENOUS
  Filled 2021-08-24: qty 50

## 2021-08-24 MED ORDER — POTASSIUM CHLORIDE IN NACL 40-0.9 MEQ/L-% IV SOLN: INTRAVENOUS | Status: DC

## 2021-08-24 MED ORDER — POTASSIUM CHLORIDE 10 MEQ/100ML IV SOLN
10.0000 meq | INTRAVENOUS | Status: AC
  Administered 2021-08-24 (×6): 10 meq via INTRAVENOUS
  Filled 2021-08-24 (×6): qty 100

## 2021-08-24 MED ORDER — POTASSIUM CHLORIDE 10 MEQ/100ML IV SOLN: 10.0000 meq | INTRAVENOUS | Status: DC

## 2021-08-24 NOTE — Progress Notes (Signed)
Subjective: Patient alert, oriented and answering questions appropriately this morning without obvious signs of confusion.  She denies any abdominal pain, nausea, or vomiting. Reports 2, loose BMs today that were dark/not black (per nursing sitter). No melena or BRBPR.   Sitter also reported that patient did not have much of her liquids for breakfast. Patient reports no appetite but denies painful swallowing or difficulty with swallowing.   Objective: Vital signs in last 24 hours: Temp:  [97.5 F (36.4 C)-98.4 F (36.9 C)] 98.4 F (36.9 C) (09/07 7591) Pulse Rate:  [54-86] 75 (09/07 0613) Resp:  [16-17] 16 (09/07 0613) BP: (116-129)/(78-93) 124/78 (09/07 6384) SpO2:  [99 %-100 %] 99 % (09/07 6659) Last BM Date: 08/21/21 General:   Alert and oriented, pleasant Head:  Normocephalic and atraumatic. Eyes:  No icterus, sclera clear. Conjuctiva pink.  Mouth:  Without lesions, mucosa pink and moist.   Heart:  S1, S2 present, no murmurs noted.  Lungs: Clear to auscultation bilaterally, without wheezing, rales, or rhonchi.  Abdomen:  Bowel sounds present, soft, non-tender, non-distended. No HSM or hernias noted. No rebound or guarding. No masses appreciated  Msk:  Symmetrical without gross deformities. Normal posture. Pulses:  Normal pulses noted. Extremities:  Without clubbing or edema. Neurologic:  Alert and  oriented x4;  grossly normal neurologically. Skin:  Warm and dry, intact without significant lesions.  Psych:  Alert and cooperative. Flat affect Lab Results: Recent Labs    08/22/21 0522 08/23/21 0553 08/24/21 0547  WBC 10.1 7.9 6.6  HGB 12.1 12.4 11.6*  HCT 35.3* 37.4 34.2*  PLT 164 182 168   BMET Recent Labs    08/22/21 0108 08/23/21 0553 08/24/21 0547  NA 133* 137 136  K 2.9* 3.2* 2.2*  CL 95* 101 103  CO2 20* 20* 24  GLUCOSE 112* 109* 86  BUN 32* 16 12  CREATININE 1.44* 0.79 0.72  CALCIUM 7.7* 7.7* 7.2*   LFT Recent Labs    08/21/21 1830  PROT 6.9   ALBUMIN 3.8  AST 28  ALT 16  ALKPHOS 67  BILITOT 1.2   PT/INR Recent Labs    08/23/21 0553 08/24/21 0547  LABPROT 26.7* 34.9*  INR 2.5* 3.5*     Studies/Results: CT CHEST W CONTRAST  Result Date: 08/23/2021 CLINICAL DATA:  80 year old female with unintended weight loss, nausea, vomiting. EXAM: CT ANGIOGRAPHY ABDOMEN AND PELVIS CT CHEST WITH CONTAST TECHNIQUE: Non-contrast CT of the chest was initially obtained. Multidetector CT imaging through the chest, abdomen and pelvis was performed using the standard protocol during bolus administration of intravenous contrast. Multiplanar reconstructed images and MIPs were obtained and reviewed to evaluate the vascular anatomy. CONTRAST:  OMNIPAQUE IOHEXOL 350 MG/ML SOLN COMPARISON:  None available. FINDINGS: CT CHEST FINDINGS Cardiovascular: Trace pericardial effusion, measuring up to 8 mm in thickness just anterior to the right ventricle. Status post endovascular aortic valvular repair without complicating features. Scattered atherosclerotic calcifications of the normal caliber thoracic aorta. Severe coronary atherosclerotic calcifications. Coarse mitral annular calcifications are noted. The heart is normal in size. No evidence of central pulmonary embolism. Mediastinum/Nodes: Prominent pretracheal lymph node measuring up to 8 mm in short axis. No additional enlarged mediastinal, hilar, or axillary lymph nodes. Thyroid gland, trachea, and esophagus demonstrate no significant findings. Lungs/Pleura: No focal consolidations. There are few punctate calcified granulomas in the peripheral right upper lobe. No suspicious pulmonary nodules. No pleural effusion or pneumothorax. Musculoskeletal: Mild multilevel degenerative changes of the thoracic spine. No acute osseous abnormality. No aggressive  appearing osseous lesion. CTA ABDOMEN AND PELVIS FINDINGS VASCULAR Aorta: Normal caliber and patent throughout. Scattered circumferential atherosclerotic  calcifications. Celiac: Mild ostial stenosis secondary to atherosclerotic plaque. Otherwise widely patent. SMA: Mild ostial stenosis secondary to atherosclerotic plaque. Otherwise widely patent. Renals: Single bilateral renal arteries are patent without evidence of aneurysm, dissection, vasculitis, fibromuscular dysplasia or significant stenosis. IMA: Patent without evidence of aneurysm, dissection, vasculitis or significant stenosis. Inflow: Patent without evidence of aneurysm, dissection, vasculitis or significant stenosis. Veins: The hepatic veins are widely patent. Portal system is widely patent and normal in caliber. The renal veins are normal in anatomic configuration and appear patent. No evidence of iliocaval thrombosis or anomaly. Review of the MIP images confirms the above findings. NON-VASCULAR Hepatobiliary: No focal liver abnormality is seen. Status post cholecystectomy. No biliary dilatation. Pancreas: Unremarkable. No pancreatic ductal dilatation or surrounding inflammatory changes. Spleen: Normal in size without focal abnormality. Adrenals/Urinary Tract: Hypoattenuating left adrenal mass measuring up to 15 mm. The right adrenal gland is normal in size morphology. Kidneys are normal in size enhancement bilaterally. No hydronephrosis, nephrolithiasis, or focal lesions. The bilateral ureters are decompressed. The bladder is relatively decompressed and unremarkable. Stomach/Bowel: Stomach is within normal limits. Appendix is not definitively identified. There are few scattered descending and sigmoid colonic diverticula without surrounding inflammatory changes. No evidence of bowel wall thickening, distention, or inflammatory changes. Lymphatic: No abdominopelvic lymphadenopathy. Reproductive: Status post hysterectomy. No adnexal masses. Other: No abdominal wall hernia or abnormality. No abdominopelvic ascites. Musculoskeletal: Mild multilevel degenerative changes of the lumbar spine. No acute osseous  abnormality. No aggressive appearing osseous lesion. IMPRESSION: VASCULAR 1. No evidence of acute or chronic occlusive mesenteric ischemia. 2. Trace pericardial effusion. 3. 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 4. Coronary and aortic atherosclerosis (ICD10-I70.0). NON VASCULAR 1. No acute abnormality in the chest, abdomen, or pelvis. 2. 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 Consider adrenal mass protocol CT follow-up in 2 years. ([employer], [employer], Boland GL, Francis IR, [place] GM, [place] PJ, [employer], Pandharipande [place]. Management of Incidental Adrenal Masses: A White Paper of the ACR Incidental Findings Committee. J Am Coll Radiol. 2017 Aug;14(8):1038-1044. doi: 10.1016/j.jacr.2017.05.001. Epub 2017 Jun 23. PMID: 29518841.) 3. Evidence of prior granulomatous disease. Marliss Coots, MD [facility]iology Electronically Signed   By: Marliss Coots M.D.   On: 08/23/2021 16:46   CT Angio Abd/Pel w/ and/or w/o  Result Date: 08/23/2021 CLINICAL DATA:  80 year old female with unintended weight loss, nausea, vomiting. EXAM: CT ANGIOGRAPHY ABDOMEN AND PELVIS CT CHEST WITH CONTAST TECHNIQUE: Non-contrast CT of the chest was initially obtained. Multidetector CT imaging through the chest, abdomen and pelvis was performed using the standard protocol during bolus administration of intravenous contrast. Multiplanar reconstructed images and MIPs were obtained and reviewed to evaluate the vascular anatomy. CONTRAST:  OMNIPAQUE IOHEXOL 350 MG/ML SOLN COMPARISON:  None available. FINDINGS: CT CHEST FINDINGS Cardiovascular: Trace pericardial effusion, measuring up to 8 mm in thickness just anterior to the right ventricle. Status post endovascular aortic valvular repair without complicating features. Scattered atherosclerotic calcifications of the normal caliber thoracic aorta. Severe coronary atherosclerotic  calcifications. Coarse mitral annular calcifications are noted. The heart is normal in size. No evidence of central pulmonary embolism. Mediastinum/Nodes: Prominent pretracheal lymph node measuring up to 8 mm in short axis. No additional enlarged mediastinal, hilar, or axillary lymph nodes. Thyroid gland, trachea, and esophagus demonstrate no significant findings. Lungs/Pleura: No focal consolidations. There are  few punctate calcified granulomas in the peripheral right upper lobe. No suspicious pulmonary nodules. No pleural effusion or pneumothorax. Musculoskeletal: Mild multilevel degenerative changes of the thoracic spine. No acute osseous abnormality. No aggressive appearing osseous lesion. CTA ABDOMEN AND PELVIS FINDINGS VASCULAR Aorta: Normal caliber and patent throughout. Scattered circumferential atherosclerotic calcifications. Celiac: Mild ostial stenosis secondary to atherosclerotic plaque. Otherwise widely patent. SMA: Mild ostial stenosis secondary to atherosclerotic plaque. Otherwise widely patent. Renals: Single bilateral renal arteries are patent without evidence of aneurysm, dissection, vasculitis, fibromuscular dysplasia or significant stenosis. IMA: Patent without evidence of aneurysm, dissection, vasculitis or significant stenosis. Inflow: Patent without evidence of aneurysm, dissection, vasculitis or significant stenosis. Veins: The hepatic veins are widely patent. Portal system is widely patent and normal in caliber. The renal veins are normal in anatomic configuration and appear patent. No evidence of iliocaval thrombosis or anomaly. Review of the MIP images confirms the above findings. NON-VASCULAR Hepatobiliary: No focal liver abnormality is seen. Status post cholecystectomy. No biliary dilatation. Pancreas: Unremarkable. No pancreatic ductal dilatation or surrounding inflammatory changes. Spleen: Normal in size without focal abnormality. Adrenals/Urinary Tract: Hypoattenuating left adrenal  mass measuring up to 15 mm. The right adrenal gland is normal in size morphology. Kidneys are normal in size enhancement bilaterally. No hydronephrosis, nephrolithiasis, or focal lesions. The bilateral ureters are decompressed. The bladder is relatively decompressed and unremarkable. Stomach/Bowel: Stomach is within normal limits. Appendix is not definitively identified. There are few scattered descending and sigmoid colonic diverticula without surrounding inflammatory changes. No evidence of bowel wall thickening, distention, or inflammatory changes. Lymphatic: No abdominopelvic lymphadenopathy. Reproductive: Status post hysterectomy. No adnexal masses. Other: No abdominal wall hernia or abnormality. No abdominopelvic ascites. Musculoskeletal: Mild multilevel degenerative changes of the lumbar spine. No acute osseous abnormality. No aggressive appearing osseous lesion. IMPRESSION: VASCULAR 1. No evidence of acute or chronic occlusive mesenteric ischemia. 2. Trace pericardial effusion. 3. 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 4. Coronary and aortic atherosclerosis (ICD10-I70.0). NON VASCULAR 1. No acute abnormality in the chest, abdomen, or pelvis. 2. 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 Consider adrenal mass protocol CT follow-up in 2 years. ([employer], [employer] J[employer], Boland GL, Francis IR, [place] GM, [place] PJ, [place] LL, Pandharipande [place]. Management of Incidental Adrenal Masses: A White Paper of the ACR Incidental Findings Committee. J Am Coll Radiol. 2017 Aug;14(8):1038-1044. doi: 10.1016/j.jacr.2017.05.001. Epub 2017 Jun 23. PMID: [id number].) 3. Evidence of prior granulomatous disease. Marliss Cootsylan Suttle, MD [facility] Electronically Signed   By: Marliss Cootsylan  Suttle M.D.   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 Recent  admission in july 2022 for nonbleeding gastric ulcer x3 in setting of supratherapeutic INR, who was brought to ER on 9/4 for persistent nausea vomiting and weight loss (15 lbs over last 1.5 mos). Patient's daughter reported that this has been a persistent issues since patients discharge from hospital in July. Vomiting reportedly occurs while she is chewing her food, prior to swallowing, therefore, she is apprehensive to eat. She was also found to have AKI/electrolyte imbalances including hypokalemia, hyponatremia and hypomagnesemia.   Etiology of persistent nausea/vomiting is unclear. Recent EGD (07/08/21) with gastric ulcers, no evidence of gastric outlet obstruction. History of DM could be playing a role with some underlying gastroparesis, however, vomiting occurs prior to swallowing food. CTA A/P done yesterday w/o evidence of acute or chronic mesenteric ischemia, no  acute abnormality in chest,abd, or pelvis to explain symptoms. Patient was tolerating clear liquids yesterday and today without vomiting. She could possibly benefit from GES to further evaluate for underlying gastroparesis.   INR initially >10, however this has been corrected with blood products, 3.5 today, no significant drop in hgb during admission, currently 11.6. Daughter reported dark stools, however, given recent hx of gastric ulcers as well as supratherapeutic INR, ulcers could be the source or stools may be dark from recent PO Iron use. H. Pylori serologies pending. No EGD warranted at this time. Per nursing sitter at bedside, patient had 2 loose BMs today that were dark, but not melanotic and without BRB.  Altered mental status, in setting of dementia, however daughter reports this has recently been acutely worse than patient's baseline. UA/urine culture done yesterday with few bacteria and trace leukocytes, neg for nitrites. Urine culture still pending. Likely related to electrolyte imbalance as mental status has improved with  correction of sodium. Alert and oriented today, answering questions appropriately.  Electrolyte imbalance: sodium normalized to 136 this morning, up from 132 with IV fluids. Potassium still remains low at 2.2 this a.m. Magnesium 1.5.  Plan: Follow urine culture results Can advance to full liquids since tolerating clears Monitor for recurring n/v Continue PPI BID Continue zofran as needed Monitor H&H/overt GI bleeding Follow for H. Pylori serology results Monitor for diarrhea Electrolyte correction per hospitalist Consider GES for eval of possible underlying gastroparesis   LOS: 2 days    08/24/2021, 9:01 AM  Kristen Bushway L. Jeanmarie Hubert, MSN, APRN, AGNP-C Adult-Gerontology Nurse Practitioner [facility]

## 2021-08-24 NOTE — Evaluation (Signed)
Physical Therapy Evaluation Patient Details Name: Kathleen Barnett MRN: [id number] DOB: 10/31/1941 Today's Date: 08/24/2021   History of Present Illness  HPI: Kathleen Barnett is a 80 y.o. female with medical history significant for atrial fibrillation on chronic anticoagulation with Coumadin, diabetes mellitus.  Was brought to the ED ports of generalized weakness, vomiting, nausea and weight loss.  Clinical Impression  PT able to come supine to stand I.  Has been walking with nursing staff to the bathroom.  PT has decreased step length, decreased standing balance and decreased activity tolerance which can be address by [facility] PT.    Follow Up Recommendations Home health PT    Equipment Recommendations     None pt states she has a walker at home   Recommendations for Other Services       Precautions / Restrictions Precautions Precautions: None Restrictions Weight Bearing Restrictions: No      Mobility  Bed Mobility Overal bed mobility: Modified Independent                  Transfers Overall transfer level: Modified independent                  Ambulation/Gait Ambulation/Gait assistance: Supervision Gait Distance (Feet): 170 Feet Assistive device: Rolling walker (2 wheeled) Gait Pattern/deviations: Decreased step length - right;Decreased step length - left   Gait velocity interpretation: <1.31 ft/sec, indicative of household ambulator           Pertinent Vitals/Pain Pain Assessment: No/denies pain    Home Living Family/patient expects to be discharged to:: Private residence   Available Help at Discharge: Family Type of Home: House Home Access: Stairs to enter     Home Layout: Two level;Able to live on main level with bedroom/bathroom Home Equipment: Grab bars - tub/shower;Shower seat;Walker - 2 wheels      Prior Function Level of Independence: Independent               Hand Dominance        Extremity/Trunk Assessment        Lower  Extremity Assessment Lower Extremity Assessment: Generalized weakness       Communication   Communication: No difficulties  Cognition Arousal/Alertness: Awake/alert   Overall Cognitive Status: No family/caregiver present to determine baseline cognitive functioning                         Assessment/Plan    PT Assessment All further PT needs can be met in the next venue of care  PT Problem List Decreased strength;Decreased balance;Decreased activity tolerance        AM-PAC PT "6 Clicks" Mobility  Outcome Measure Help needed turning from your back to your side while in a flat bed without using bedrails?: None Help needed moving from lying on your back to sitting on the side of a flat bed without using bedrails?: None Help needed moving to and from a bed to a chair (including a wheelchair)?: A Little Help needed standing up from a chair using your arms (e.g., wheelchair or bedside chair)?: None Help needed to walk in hospital room?: A Little Help needed climbing 3-5 steps with a railing? : A Little 6 Click Score: 21    End of Session Equipment Utilized During Treatment: Gait belt Activity Tolerance: Patient limited by fatigue Patient left: in chair;Other (comment) (Pt has nursing sitter in the room with her) Nurse Communication: Mobility status PT Visit Diagnosis: Unsteadiness on feet (R26.81);Muscle  weakness (generalized) (M62.81)    Time: 9371-6967 PT Time Calculation (min) (ACUTE ONLY): 29 min   Charges:   PT Evaluation $PT Eval Low Complexity: 1 Low PT Treatments $Gait Training: 8-22 mins      Rayetta Humphrey, PT CLT [phone]  08/24/2021, 2:14 PM

## 2021-08-24 NOTE — Progress Notes (Signed)
PROGRESS NOTE  Kathleen Barnett YYT:[id number] DOB: 10-Dec-1941 DOA: 08/21/2021 PCP: Marylynn Pearson, FNP  Brief History:  As per H&P written by Dr. Mariea Clonts on 08/21/2021 Kathleen Barnett is a 80 y.o. female with medical history significant for atrial fibrillation on chronic anticoagulation with Coumadin, diabetes mellitus. Was brought to the ED ports of generalized weakness, vomiting, nausea and weight loss.  Daughter also reported patient's INR has been elevated.  The time of my evaluation daughter has left, patient is not exactly sure sure why she was brought to the hospital.  She denies abdominal pain.  Reports generalized weakness.   Patient was admitted 7/19-7/23 for supratherapeutic INR of greater than 10, with melena, symptomatic anemia.  Patient required FFP x1 unit, was given vitamin K.  EGD done showed several gastric ulcers.    ED Course: Stable vitals, blood pressure 105 242.  Globin stable 14.  INR greater than 10.  Potassium 2.6. 1.4.  Stool FOBT positive.  2.5 mg vitamin K given.  Hospitalist to admit.  Assessment/Plan: supratherapeutic INR with melena and -With INR more than 10 on presentation -Hemoglobin has remained stable -After expressing plasma and vitamin K INR 2.8 -Continue to follow hemoglobin trend -Gastroenterology service has been consulted and planning conservative management for now no endoscopic intervention planned. Per GI also, CT [place] of abdomen and pelvis with and without contrast will be pursuit.  Also CT chest with contrast will be check. -advance to full liquid -continue PPI   history of peptic ulcer disease -Continue avoiding NSAIDs. -Maintain INR therapeutic and control. -Continue to follow hemoglobin trend; no transfusion needed. -Continue PPI.   hypokalemia, hypomagnesemia and hyponatremia -Continue electrolytes repletion. -Follow electrolytes trend.   weakness, vomiting and weight loss -Patient feeling better after fluid  resuscitation. -Continue to provide repletion. -UA--no pyuria -check B12 -check folate -TSH   controlled type 2 diabetes mellitus -Continue holding oral hypoglycemic agent -Follow CBGs.  dementia with behavioral disturbance and high risk for hospital-acquired delirium. -Resume Aricept, Namenda and Seroquel. -Resume trazodone at nighttime -Continue constant reorientation.   hyperlipidemia -Continue holding statin for now.   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 -Lactic acid within normal limits now; improvement in metabolic acidosis appreciated. -baseline creatinine    aortic valve replacement -Continue Coumadin  -does not appear to be mechanical on exam -consult cardiology -Echo to clarify -pharmacy to help with coumadin dose.        Status is: Inpatient  Remains inpatient appropriate because:Inpatient level of care appropriate due to severity of illness  Dispo: The patient is from: Home              Anticipated d/c is to: Home              Patient currently not medically stable for d/c   Difficult to place patient No        Family Communication:   daughter updated 9/8  Consultants:  cardiology, GI  Code Status:  FULL  DVT Prophylaxis:  coumadin   Procedures: As Listed in Progress Note Above  Antibiotics: None       Subjective: Patient denies fevers, chills, headache, chest pain, dyspnea, nausea, vomiting, diarrhea, abdominal pain, dysuria, hematuria, hematochezia, and melena.   Objective: Vitals:   08/23/21 0453 08/23/21 1318 08/23/21 2030 08/24/21 0613  BP: (!) 129/92 (!) 129/93 116/83 124/78  Pulse: 95  86 (!) 54 75  Resp: (!) 22  17 16   Temp: 97.6 F (36.4 C) (!) 97.5 F (36.4 C) 98.2 F (36.8 C) 98.4 F (36.9 C)  TempSrc: Oral Oral Oral Oral  SpO2: 100% 100% 99% 99%  Weight:      Height:        Intake/Output Summary (Last 24 hours) at  08/24/2021 1555 Last data filed at 08/24/2021 1553 Gross per 24 hour  Intake 1135.03 ml  Output --  Net 1135.03 ml   Weight change:  Exam:  General:  Pt is alert, follows commands appropriately, not in acute distress HEENT: No icterus, No thrush, No neck mass, [place]/AT Cardiovascular: RRR, S1/S2, no rubs, no gallops Respiratory: bibasilar rales. No wheeze Abdomen: Soft/+BS, non tender, non distended, no guarding Extremities: Nonpitting edema, No lymphangitis, No petechiae, No rashes, no synovitis   Data Reviewed: I have personally reviewed following labs and imaging studies Basic Metabolic Panel: Recent Labs  Lab 08/21/21 1830 08/22/21 0108 08/23/21 0553 08/24/21 0547  NA 132* 133* 137 136  K 2.6* 2.9* 3.2* 2.2*  CL 92* 95* 101 103  CO2 20* 20* 20* 24  GLUCOSE 100* 112* 109* 86  BUN 31* 32* 16 12  CREATININE 1.40* 1.44* 0.79 0.72  CALCIUM 8.0* 7.7* 7.7* 7.2*  MG 1.3*  --  1.2* 1.5*   Liver Function Tests: Recent Labs  Lab 08/21/21 1830  AST 28  ALT 16  ALKPHOS 67  BILITOT 1.2  PROT 6.9  ALBUMIN 3.8   No results for input(s): LIPASE, AMYLASE in the last 168 hours. No results for input(s): AMMONIA in the last 168 hours. Coagulation Profile: Recent Labs  Lab 08/21/21 1830 08/22/21 0522 08/23/21 0553 08/24/21 0547  INR >10.0* 2.8* 2.5* 3.5*   CBC: Recent Labs  Lab 08/21/21 1830 08/22/21 0108 08/22/21 0522 08/23/21 0553 08/24/21 0547  WBC 12.5* 13.7* 10.1 7.9 6.6  NEUTROABS 9.8*  --   --   --   --   HGB 14.0 13.6 12.1 12.4 11.6*  HCT 40.5 40.9 35.3* 37.4 34.2*  MCV 87.3 89.1 88.3 89.0 86.6  PLT 207 170 164 182 168   Cardiac Enzymes: No results for input(s): CKTOTAL, CKMB, CKMBINDEX, TROPONINI in the last 168 hours. BNP: Invalid input(s): POCBNP CBG: Recent Labs  Lab 08/23/21 0455 08/23/21 0827 08/23/21 2026 08/24/21 0545 08/24/21 1427  GLUCAP 76 94 128* 96 131*   HbA1C: No results for input(s): HGBA1C in the last 72 hours. Urine  analysis:    Component Value Date/Time   COLORURINE YELLOW 08/23/2021 1616   APPEARANCEUR CLEAR 08/23/2021 1616   LABSPEC 1.025 08/23/2021 1616   PHURINE 6.0 08/23/2021 1616   GLUCOSEU NEGATIVE 08/23/2021 1616   HGBUR TRACE (A) 08/23/2021 1616   BILIRUBINUR SMALL (A) 08/23/2021 1616   KETONESUR >80 (A) 08/23/2021 1616   PROTEINUR TRACE (A) 08/23/2021 1616   NITRITE NEGATIVE 08/23/2021 1616   LEUKOCYTESUR TRACE (A) 08/23/2021 1616   Sepsis Labs: @LABRCNTIP (procalcitonin:4,lacticidven:4) ) Recent Results (from the past 240 hour(s))  Resp Panel by RT-PCR (Flu A&B, Covid) Nasopharyngeal Swab     Status: None   Collection Time: 08/21/21  7:12 PM   Specimen: Nasopharyngeal Swab; Nasopharyngeal(NP) swabs in vial transport medium  Result Value Ref Range Status   SARS Coronavirus 2 by RT PCR NEGATIVE NEGATIVE Final    Comment: (NOTE) SARS-CoV-2 target nucleic acids are NOT DETECTED.  The SARS-CoV-2 RNA is generally detectable in upper respiratory specimens during the acute phase of infection. The  lowest concentration of SARS-CoV-2 viral copies this assay can detect is 138 copies/mL. A negative result does not preclude SARS-Cov-2 infection and should not be used as the sole basis for treatment or other patient management decisions. A negative result may occur with  improper specimen collection/handling, submission of specimen other than nasopharyngeal swab, presence of viral mutation(s) within the areas targeted by this assay, and inadequate number of viral copies(<138 copies/mL). A negative result must be combined with clinical observations, patient history, and epidemiological information. The expected result is Negative.  Fact Sheet for Patients:  BloggerCourse.comhttps://www.fda.gov/media/152166/download  Fact Sheet for Healthcare Providers:  SeriousBroker.ithttps://www.fda.gov/media/152162/download  This test is no t yet approved or cleared by the [place] FDA and  has been authorized for detection  and/or diagnosis of SARS-CoV-2 by FDA under an Emergency Use Authorization (EUA). This EUA will remain  in effect (meaning this test can be used) for the duration of the COVID-19 declaration under Section 564(b)(1) of the Act, 21 U.S.C.section 360bbb-3(b)(1), unless the authorization is terminated  or revoked sooner.       Influenza A by PCR NEGATIVE NEGATIVE Final   Influenza B by PCR NEGATIVE NEGATIVE Final    Comment: (NOTE) The Xpert Xpress SARS-CoV-2/FLU/RSV plus assay is intended as an aid in the diagnosis of influenza from Nasopharyngeal swab specimens and should not be used as a sole basis for treatment. Nasal washings and aspirates are unacceptable for Xpert Xpress SARS-CoV-2/FLU/RSV testing.  Fact Sheet for Patients: BloggerCourse.comhttps://www.fda.gov/media/152166/download  Fact Sheet for Healthcare Providers: SeriousBroker.ithttps://www.fda.gov/media/152162/download  This test is not yet approved or cleared by the [place] FDA and has been authorized for detection and/or diagnosis of SARS-CoV-2 by FDA under an Emergency Use Authorization (EUA). This EUA will remain in effect (meaning this test can be used) for the duration of the COVID-19 declaration under Section 564(b)(1) of the Act, 21 U.S.C. section 360bbb-3(b)(1), unless the authorization is terminated or revoked.  Performed at [facility], [address] [place], [place] [address]      Scheduled Meds:  (feeding supplement) PROSource Plus  30 mL Oral TID BM   donepezil  10 mg Oral Daily   feeding supplement  1 Container Oral TID BM   insulin aspart  0-9 Units Subcutaneous Q8H   isosorbide mononitrate  30 mg Oral Daily   memantine  10 mg Oral BID   multivitamin with minerals  1 tablet Oral Daily   pantoprazole  40 mg Intravenous Q12H   QUEtiapine  25 mg Oral QHS   sucralfate  1 g Oral TID   traZODone  150 mg Oral QHS   Warfarin - Pharmacist Dosing Inpatient   Does not apply q1600   Continuous Infusions:  0.9 % NaCl with KCl  40 mEq / L 75 mL/hr at 08/24/21 1553   magnesium sulfate bolus IVPB      Procedures/Studies: CT CHEST W CONTRAST  Result Date: 08/23/2021 CLINICAL DATA:  80 year old female with unintended weight loss, nausea, vomiting. EXAM: CT ANGIOGRAPHY ABDOMEN AND PELVIS CT CHEST WITH CONTAST TECHNIQUE: Non-contrast CT of the chest was initially obtained. Multidetector CT imaging through the chest, abdomen and pelvis was performed using the standard protocol during bolus administration of intravenous contrast. Multiplanar reconstructed images and MIPs were obtained and reviewed to evaluate the vascular anatomy. CONTRAST:  100mL OMNIPAQUE IOHEXOL 350 MG/ML SOLN COMPARISON:  None available. FINDINGS: CT CHEST FINDINGS Cardiovascular: Trace pericardial effusion, measuring up to 8 mm in thickness just anterior to the right ventricle. Status post endovascular aortic  valvular repair without complicating features. Scattered atherosclerotic calcifications of the normal caliber thoracic aorta. Severe coronary atherosclerotic calcifications. Coarse mitral annular calcifications are noted. The heart is normal in size. No evidence of central pulmonary embolism. Mediastinum/Nodes: Prominent pretracheal lymph node measuring up to 8 mm in short axis. No additional enlarged mediastinal, hilar, or axillary lymph nodes. Thyroid gland, trachea, and esophagus demonstrate no significant findings. Lungs/Pleura: No focal consolidations. There are few punctate calcified granulomas in the peripheral right upper lobe. No suspicious pulmonary nodules. No pleural effusion or pneumothorax. Musculoskeletal: Mild multilevel degenerative changes of the thoracic spine. No acute osseous abnormality. No aggressive appearing osseous lesion. CTA ABDOMEN AND PELVIS FINDINGS VASCULAR Aorta: Normal caliber and patent throughout. Scattered circumferential atherosclerotic calcifications. Celiac: Mild ostial stenosis secondary to atherosclerotic plaque. Otherwise  widely patent. SMA: Mild ostial stenosis secondary to atherosclerotic plaque. Otherwise widely patent. Renals: Single bilateral renal arteries are patent without evidence of aneurysm, dissection, vasculitis, fibromuscular dysplasia or significant stenosis. IMA: Patent without evidence of aneurysm, dissection, vasculitis or significant stenosis. Inflow: Patent without evidence of aneurysm, dissection, vasculitis or significant stenosis. Veins: The hepatic veins are widely patent. Portal system is widely patent and normal in caliber. The renal veins are normal in anatomic configuration and appear patent. No evidence of iliocaval thrombosis or anomaly. Review of the MIP images confirms the above findings. NON-VASCULAR Hepatobiliary: No focal liver abnormality is seen. Status post cholecystectomy. No biliary dilatation. Pancreas: Unremarkable. No pancreatic ductal dilatation or surrounding inflammatory changes. Spleen: Normal in size without focal abnormality. Adrenals/Urinary Tract: Hypoattenuating left adrenal mass measuring up to 15 mm. The right adrenal gland is normal in size morphology. Kidneys are normal in size enhancement bilaterally. No hydronephrosis, nephrolithiasis, or focal lesions. The bilateral ureters are decompressed. The bladder is relatively decompressed and unremarkable. Stomach/Bowel: Stomach is within normal limits. Appendix is not definitively identified. There are few scattered descending and sigmoid colonic diverticula without surrounding inflammatory changes. No evidence of bowel wall thickening, distention, or inflammatory changes. Lymphatic: No abdominopelvic lymphadenopathy. Reproductive: Status post hysterectomy. No adnexal masses. Other: No abdominal wall hernia or abnormality. No abdominopelvic ascites. Musculoskeletal: Mild multilevel degenerative changes of the lumbar spine. No acute osseous abnormality. No aggressive appearing osseous lesion. IMPRESSION: VASCULAR 1. No evidence of  acute or chronic occlusive mesenteric ischemia. 2. Trace pericardial effusion. 3. 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 4. Coronary and aortic atherosclerosis (ICD10-I70.0). NON VASCULAR 1. No acute abnormality in the chest, abdomen, or pelvis. 2. 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 Consider adrenal mass protocol CT follow-up in 2 years. ([employer], [employer], [employer] GL, Francis IR, [place] GM, [employer], [place] [employer], Pandhar[employer]ande [place]. Management of Incidental Adrenal Masses: A White Paper of the ACR Incidental Findings Committee. J Am Coll Radiol. 2017 Aug;14(8):1038-1044. doi: 10.1016/j.jacr.2017.05.001. Epub 2017 Jun 23. PMID: 21308657.) 3. Evidence of prior granulomatous disease. Marliss Coots, MD [facility] Radiology Electronically Signed   By: Marliss Coots M.D.   On: 08/23/2021 16:46   CT Angio Abd/Pel w/ and/or w/o  Result Date: 08/23/2021 CLINICAL DATA:  80 year old female with unintended weight loss, nausea, vomiting. EXAM: CT ANGIOGRAPHY ABDOMEN AND PELVIS CT CHEST WITH CONTAST TECHNIQUE: Non-contrast CT of the chest was initially obtained. Multidetector CT imaging through the chest, abdomen and pelvis was performed using the standard protocol during bolus administration of intravenous contrast. Multiplanar reconstructed images and MIPs were obtained and reviewed to evaluate the vascular anatomy. CONTRAST:  OMNIPAQUE  IOHEXOL 350 MG/ML SOLN COMPARISON:  None available. FINDINGS: CT CHEST FINDINGS Cardiovascular: Trace pericardial effusion, measuring up to 8 mm in thickness just anterior to the right ventricle. Status post endovascular aortic valvular repair without complicating features. Scattered atherosclerotic calcifications of the normal caliber thoracic aorta. Severe coronary atherosclerotic calcifications. Coarse mitral annular calcifications are noted. The heart is normal in size. No evidence  of central pulmonary embolism. Mediastinum/Nodes: Prominent pretracheal lymph node measuring up to 8 mm in short axis. No additional enlarged mediastinal, hilar, or axillary lymph nodes. Thyroid gland, trachea, and esophagus demonstrate no significant findings. Lungs/Pleura: No focal consolidations. There are few punctate calcified granulomas in the peripheral right upper lobe. No suspicious pulmonary nodules. No pleural effusion or pneumothorax. Musculoskeletal: Mild multilevel degenerative changes of the thoracic spine. No acute osseous abnormality. No aggressive appearing osseous lesion. CTA ABDOMEN AND PELVIS FINDINGS VASCULAR Aorta: Normal caliber and patent throughout. Scattered circumferential atherosclerotic calcifications. Celiac: Mild ostial stenosis secondary to atherosclerotic plaque. Otherwise widely patent. SMA: Mild ostial stenosis secondary to atherosclerotic plaque. Otherwise widely patent. Renals: Single bilateral renal arteries are patent without evidence of aneurysm, dissection, vasculitis, fibromuscular dysplasia or significant stenosis. IMA: Patent without evidence of aneurysm, dissection, vasculitis or significant stenosis. Inflow: Patent without evidence of aneurysm, dissection, vasculitis or significant stenosis. Veins: The hepatic veins are widely patent. Portal system is widely patent and normal in caliber. The renal veins are normal in anatomic configuration and appear patent. No evidence of iliocaval thrombosis or anomaly. Review of the MIP images confirms the above findings. NON-VASCULAR Hepatobiliary: No focal liver abnormality is seen. Status post cholecystectomy. No biliary dilatation. Pancreas: Unremarkable. No pancreatic ductal dilatation or surrounding inflammatory changes. Spleen: Normal in size without focal abnormality. Adrenals/Urinary Tract: Hypoattenuating left adrenal mass measuring up to 15 mm. The right adrenal gland is normal in size morphology. Kidneys are normal in  size enhancement bilaterally. No hydronephrosis, nephrolithiasis, or focal lesions. The bilateral ureters are decompressed. The bladder is relatively decompressed and unremarkable. Stomach/Bowel: Stomach is within normal limits. Appendix is not definitively identified. There are few scattered descending and sigmoid colonic diverticula without surrounding inflammatory changes. No evidence of bowel wall thickening, distention, or inflammatory changes. Lymphatic: No abdominopelvic lymphadenopathy. Reproductive: Status post hysterectomy. No adnexal masses. Other: No abdominal wall hernia or abnormality. No abdominopelvic ascites. Musculoskeletal: Mild multilevel degenerative changes of the lumbar spine. No acute osseous abnormality. No aggressive appearing osseous lesion. IMPRESSION: VASCULAR 1. No evidence of acute or chronic occlusive mesenteric ischemia. 2. Trace pericardial effusion. 3. 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 4. Coronary and aortic atherosclerosis (ICD10-I70.0). NON VASCULAR 1. No acute abnormality in the chest, abdomen, or pelvis. 2. 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 Consider adrenal mass protocol CT follow-up in 2 years. ([employer], [employer], [employer], Francis IR, [place] GM, [place] [employer], [place] [employer], Pandhar[employer]ande [place]. Management of Incidental Adrenal Masses: A White Paper of the ACR Incidental Findings Committee. J Am Coll Radiol. 2017 Aug;14(8):1038-1044. doi: 10.1016/j.jacr.2017.05.001. Epub 2017 Jun 23. PMID: [id number].) 3. Evidence of prior granulomatous disease. Marliss Coots, MD [facility] Radiology Electronically Signed   By: Marliss Coots M.D.   On: 08/23/2021 16:46    Catarina Hartshorn, DO  [facility]  If 7PM-7AM, please contact night-coverage [email or web address] Password [email or web address] 08/24/2021, 3:55 PM   LOS: 2 days

## 2021-08-24 NOTE — Progress Notes (Addendum)
ANTICOAGULATION CONSULT NOTE -    Pharmacy Consult for warfarin dosing  Indication: atrial fibrillation with mechanical valve    No Known Allergies    Patient Measurements: Last Weight  Most recent update: 08/21/2021  6:09 PM    Weight  83.5 kg (184 lb)            Body mass index is 28.82 kg/m. Kathleen Barnett               Temp: 98.4 F (36.9 C) (09/07 0613) Temp Source: Oral (09/07 0613) BP: 124/78 (09/07 5784) Pulse Rate: 75 (09/07 0613)  Labs: Recent Labs    08/22/21 0108 08/22/21 0522 08/23/21 0553 08/24/21 0547  HGB 13.6 12.1 12.4 11.6*  HCT 40.9 35.3* 37.4 34.2*  PLT 170 164 182 168  LABPROT  --  29.2* 26.7* 34.9*  INR  --  2.8* 2.5* 3.5*  CREATININE 1.44*  --  0.79 0.72     Estimated Creatinine Clearance: 64.4 mL/min (by C-G formula based on SCr of 0.72 mg/dL).     Medications:  Medications Prior to Admission  Medication Sig Dispense Refill Last Dose   albuterol (VENTOLIN HFA) 108 (90 Base) MCG/ACT inhaler Inhale 2 puffs into the lungs every 6 (six) hours as needed for wheezing or shortness of breath.    PRN   atorvastatin (LIPITOR) 20 MG tablet Take 20 mg by mouth every evening.    08/21/2021   docusate sodium (COLACE) 100 MG capsule Take 1 capsule (100 mg total) by mouth 2 (two) times daily. 60 capsule 2 08/21/2021   donepezil (ARICEPT) 10 MG tablet Take 10 mg by mouth daily.   08/21/2021   ferrous sulfate 325 (65 FE) MG tablet Take 1 tablet (325 mg total) by mouth daily with breakfast. 30 tablet 2 08/21/2021   isosorbide mononitrate (IMDUR) 30 MG 24 hr tablet Take 30 mg by mouth daily.    08/21/2021   memantine (NAMENDA) 10 MG tablet Take 10 mg by mouth 2 (two) times daily.   08/21/2021   metFORMIN (GLUCOPHAGE) 500 MG tablet Take 500 mg by mouth 2 (two) times daily with a meal.   08/21/2021   omeprazole (PRILOSEC) 40 MG capsule Take 1 capsule (40 mg total) by mouth in the morning and at bedtime. 60 capsule 3 08/21/2021   QUEtiapine (SEROQUEL) 25 MG tablet Take 1  tablet (25 mg total) by mouth at bedtime. 30 tablet 0 08/20/2021   traMADol (ULTRAM) 50 MG tablet Take 50 mg by mouth daily as needed (pain).    PRN   traZODone (DESYREL) 150 MG tablet Take 1 tablet (150 mg total) by mouth at bedtime.   08/20/2021   warfarin (COUMADIN) 5 MG tablet Take 1 tablet (5 mg total) by mouth daily at 4 PM. 30 tablet 0 08/21/2021 at pm   Scheduled:   (feeding supplement) PROSource Plus  30 mL Oral TID BM   donepezil  10 mg Oral Daily   feeding supplement  1 Container Oral TID BM   insulin aspart  0-9 Units Subcutaneous Q8H   isosorbide mononitrate  30 mg Oral Daily   memantine  10 mg Oral BID   multivitamin with minerals  1 tablet Oral Daily   pantoprazole  40 mg Intravenous Q12H   QUEtiapine  25 mg Oral QHS   sucralfate  1 g Oral TID   traZODone  150 mg Oral QHS   Warfarin - Pharmacist Dosing Inpatient   Does not apply q1600   Infusions:  0.9 % NaCl with KCl 40 mEq / L 75 mL/hr at 08/24/21 8546   potassium chloride 10 mEq (08/24/21 0941)   PRN: acetaminophen **OR** acetaminophen, ondansetron (ZOFRAN) IV Anti-infectives (From admission, onward)    None       Goal of Therapy:  INR 2.5-3.5 Monitor platelets by anticoagulation protocol: Yes    Prior to Admission Warfarin Dosing:  Kathleen Barnett takes 5mg  of warfarin daily   Admit INR was >10.0 Vitamin K given iv 2.5mg  on 9/4  Lab Results  Component Value Date   INR 3.5 (H) 08/24/2021   INR 2.5 (H) 08/23/2021   INR 2.8 (H) 08/22/2021    Assessment: Kathleen Barnett a 80 y.o. female requires anticoagulation with warfarin for the indication of  atrial fibrillation and mechanical valve . Warfarin will be initiated inpatient following pharmacy protocol per pharmacy consult. Patient most recent blood work is as follows: CBC Latest Ref Rng & Units 08/24/2021 08/23/2021 08/22/2021  WBC 4.0 - 10.5 K/uL 6.6 7.9 10.1  Hemoglobin 12.0 - 15.0 g/dL 11.6(L) 12.4 12.1  Hematocrit 36.0 - 46.0 % 34.2(L) 37.4 35.3(L)   Platelets 150 - 400 K/uL 168 182 164   Per anti-coag clinic on 08/08/21 patient is on 5mg  Warfarin ever Monday and Friday, and and 7.5mg  all other days.  9/7 INR 3.5  is therapeutic today but upper end only after 2 doses and was supratherapeutic.  Plan: Hold Coumadin with large increase in INR from yesterday  Monitor CBC MWF with am labs   Monitor INR daily Monitor for signs and symptoms of bleeding   Thursday, BS 11/7, BCPS Clinical Pharmacist Pager [phone]

## 2021-08-24 NOTE — Progress Notes (Signed)
Patient was observed while she was drinking/eating clear liquids. She appeared to have no difficulty with swallowing. Her abdominal examination is within normal limits. Diet advanced to pured diet as she does not have her dentures. According to nursing staff she has had 3 loose stools today.  Stools are not tarry or red. Will monitor stool frequency and consistency for the next 24 hours before considering further studies.  Agree with Ms. Percell Boston, NP's recommendations.

## 2021-08-25 ENCOUNTER — Inpatient Hospital Stay (HOSPITAL_COMMUNITY): Payer: Medicare Other

## 2021-08-25 DIAGNOSIS — I4891 Unspecified atrial fibrillation: Secondary | ICD-10-CM

## 2021-08-25 DIAGNOSIS — R531 Weakness: Secondary | ICD-10-CM

## 2021-08-25 DIAGNOSIS — I358 Other nonrheumatic aortic valve disorders: Secondary | ICD-10-CM

## 2021-08-25 LAB — URINALYSIS, COMPLETE (UACMP) WITH MICROSCOPIC
Bilirubin Urine: NEGATIVE
Glucose, UA: 250 mg/dL — AB
Ketones, ur: NEGATIVE mg/dL
Nitrite: POSITIVE — AB
Protein, ur: NEGATIVE mg/dL
Specific Gravity, Urine: 1.01 (ref 1.005–1.030)
pH: 6 (ref 5.0–8.0)

## 2021-08-25 LAB — PREPARE FRESH FROZEN PLASMA

## 2021-08-25 LAB — BASIC METABOLIC PANEL
Anion gap: 7 (ref 5–15)
BUN: 8 mg/dL (ref 8–23)
CO2: 28 mmol/L (ref 22–32)
Calcium: 7.9 mg/dL — ABNORMAL LOW (ref 8.9–10.3)
Chloride: 106 mmol/L (ref 98–111)
Creatinine, Ser: 0.68 mg/dL (ref 0.44–1.00)
GFR, Estimated: >60 mL/min (ref >60)
Glucose, Bld: 93 mg/dL (ref 70–99)
Potassium: 3.1 mmol/L — ABNORMAL LOW (ref 3.5–5.1)
Sodium: 141 mmol/L (ref 135–145)

## 2021-08-25 LAB — BPAM FFP
Blood Product Expiration Date: 202209060056
ISSUE DATE / TIME: 202209050127
Unit Type and Rh: 600

## 2021-08-25 LAB — URINE CULTURE

## 2021-08-25 LAB — ECHOCARDIOGRAM COMPLETE
AR max vel: 3.36 cm2
AV Area VTI: 3.06 cm2
AV Area mean vel: 3.2 cm2
AV Mean grad: 2 mmHg
AV Peak grad: 4.1 mmHg
Ao pk vel: 1.01 m/s
Area-P 1/2: 4.17 cm2
Height: 67 in
MV VTI: 1.71 cm2
S' Lateral: 2.44 cm
Weight: 2944 oz

## 2021-08-25 LAB — PROTIME-INR
INR: 3.5 — ABNORMAL HIGH (ref 0.8–1.2)
Prothrombin Time: 35.3 seconds — ABNORMAL HIGH (ref 11.4–15.2)

## 2021-08-25 LAB — MAGNESIUM: Magnesium: 1.4 mg/dL — ABNORMAL LOW (ref 1.7–2.4)

## 2021-08-25 LAB — GLUCOSE, CAPILLARY
Glucose-Capillary: 98 mg/dL (ref 70–99)
Glucose-Capillary: 102 mg/dL — ABNORMAL HIGH (ref 70–99)
Glucose-Capillary: 98 mg/dL (ref 70–99)
Glucose-Capillary: 129 mg/dL — ABNORMAL HIGH (ref 70–99)
Glucose-Capillary: 126 mg/dL — ABNORMAL HIGH (ref 70–99)

## 2021-08-25 MED ORDER — MAGNESIUM SULFATE 2 GM/50ML IV SOLN
2.0000 g | Freq: Once | INTRAVENOUS | Status: AC
  Administered 2021-08-25: 2 g via INTRAVENOUS
  Filled 2021-08-25: qty 50

## 2021-08-25 MED ORDER — POTASSIUM CHLORIDE 10 MEQ/100ML IV SOLN
10.0000 meq | INTRAVENOUS | Status: DC
  Administered 2021-08-25: 10 meq via INTRAVENOUS
  Filled 2021-08-25: qty 100

## 2021-08-25 MED ORDER — POTASSIUM CHLORIDE 10 MEQ/100ML IV SOLN
10.0000 meq | INTRAVENOUS | Status: AC
  Administered 2021-08-25 (×4): 10 meq via INTRAVENOUS

## 2021-08-25 NOTE — Progress Notes (Signed)
Subjective:  Alert and sitting up in chair. Drinking tea without difficulty. Has eaten several bites of her pureed meal. Denies n/v, abdominal pain. No BM today. Ate 75% of breakfast.patient states her appetite has been diminished for a long time.  Objective: Vital signs in last 24 hours: Temp:  [97.5 F (36.4 C)-98.2 F (36.8 C)] 98.2 F (36.8 C) (09/08 0326) Pulse Rate:  [74-93] 80 (09/08 0900) Resp:  [19-20] 19 (09/08 0326) BP: (105-144)/(83-94) 105/83 (09/08 0900) SpO2:  [98 %] 98 % (09/08 0326) Last BM Date: 08/24/21 General:   Alert,  Well-developed, well-nourished, pleasant and cooperative in NAD Head:  Normocephalic and atraumatic. Eyes:  Sclera clear, no icterus.  Abdomen:  Soft, nontender and nondistended.  Normal bowel sounds, without guarding, and without rebound.   Extremities:  Without clubbing, deformity or edema. Neurologic:  Alert and  oriented to person and place. Skin:  Intact without significant lesions or rashes. Psych:  Alert and cooperative. Normal mood and affect.  Intake/Output from previous day: 09/07 0701 - 09/08 0700 In: 1215.2 [I.V.:675; IV Piggyback:540.2] Out: -  Intake/Output this shift: Total I/O In: 240 [P.O.:240] Out: -   Lab Results: CBC Recent Labs    08/23/21 0553 08/24/21 0547  WBC 7.9 6.6  HGB 12.4 11.6*  HCT 37.4 34.2*  MCV 89.0 86.6  PLT 182 168   BMET Recent Labs    08/23/21 0553 08/24/21 0547 08/25/21 0634  NA 137 136 141  K 3.2* 2.2* 3.1*  CL 101 103 106  CO2 20* 24 28  GLUCOSE 109* 86 93  BUN 16 12 8   CREATININE 0.79 0.72 0.68  CALCIUM 7.7* 7.2* 7.9*   LFTs No results for input(s): BILITOT, BILIDIR, IBILI, ALKPHOS, AST, ALT, PROT, ALBUMIN in the last 72 hours. No results for input(s): LIPASE in the last 72 hours. PT/INR Recent Labs    08/23/21 0553 08/24/21 0547 08/25/21 0634  LABPROT 26.7* 34.9* 35.3*  INR 2.5* 3.5* 3.5*      Imaging Studies: CT CHEST W CONTRAST  Result Date: 08/23/2021 CLINICAL  DATA:  80 year old female with unintended weight loss, nausea, vomiting. EXAM: CT ANGIOGRAPHY ABDOMEN AND PELVIS CT CHEST WITH CONTAST TECHNIQUE: Non-contrast CT of the chest was initially obtained. Multidetector CT imaging through the chest, abdomen and pelvis was performed using the standard protocol during bolus administration of intravenous contrast. Multiplanar reconstructed images and MIPs were obtained and reviewed to evaluate the vascular anatomy. CONTRAST:  70 OMNIPAQUE IOHEXOL 350 MG/ML SOLN COMPARISON:  None available. FINDINGS: CT CHEST FINDINGS Cardiovascular: Trace pericardial effusion, measuring up to 8 mm in thickness just anterior to the right ventricle. Status post endovascular aortic valvular repair without complicating features. Scattered atherosclerotic calcifications of the normal caliber thoracic aorta. Severe coronary atherosclerotic calcifications. Coarse mitral annular calcifications are noted. The heart is normal in size. No evidence of central pulmonary embolism. Mediastinum/Nodes: Prominent pretracheal lymph node measuring up to 8 mm in short axis. No additional enlarged mediastinal, hilar, or axillary lymph nodes. Thyroid gland, trachea, and esophagus demonstrate no significant findings. Lungs/Pleura: No focal consolidations. There are few punctate calcified granulomas in the peripheral right upper lobe. No suspicious pulmonary nodules. No pleural effusion or pneumothorax. Musculoskeletal: Mild multilevel degenerative changes of the thoracic spine. No acute osseous abnormality. No aggressive appearing osseous lesion. CTA ABDOMEN AND PELVIS FINDINGS VASCULAR Aorta: Normal caliber and patent throughout. Scattered circumferential atherosclerotic calcifications. Celiac: Mild ostial stenosis secondary to atherosclerotic plaque. Otherwise widely patent. SMA: Mild ostial stenosis secondary to atherosclerotic plaque.  Otherwise widely patent. Renals: Single bilateral renal arteries are patent  without evidence of aneurysm, dissection, vasculitis, fibromuscular dysplasia or significant stenosis. IMA: Patent without evidence of aneurysm, dissection, vasculitis or significant stenosis. Inflow: Patent without evidence of aneurysm, dissection, vasculitis or significant stenosis. Veins: The hepatic veins are widely patent. Portal system is widely patent and normal in caliber. The renal veins are normal in anatomic configuration and appear patent. No evidence of iliocaval thrombosis or anomaly. Review of the MIP images confirms the above findings. NON-VASCULAR Hepatobiliary: No focal liver abnormality is seen. Status post cholecystectomy. No biliary dilatation. Pancreas: Unremarkable. No pancreatic ductal dilatation or surrounding inflammatory changes. Spleen: Normal in size without focal abnormality. Adrenals/Urinary Tract: Hypoattenuating left adrenal mass measuring up to 15 mm. The right adrenal gland is normal in size morphology. Kidneys are normal in size enhancement bilaterally. No hydronephrosis, nephrolithiasis, or focal lesions. The bilateral ureters are decompressed. The bladder is relatively decompressed and unremarkable. Stomach/Bowel: Stomach is within normal limits. Appendix is not definitively identified. There are few scattered descending and sigmoid colonic diverticula without surrounding inflammatory changes. No evidence of bowel wall thickening, distention, or inflammatory changes. Lymphatic: No abdominopelvic lymphadenopathy. Reproductive: Status post hysterectomy. No adnexal masses. Other: No abdominal wall hernia or abnormality. No abdominopelvic ascites. Musculoskeletal: Mild multilevel degenerative changes of the lumbar spine. No acute osseous abnormality. No aggressive appearing osseous lesion. IMPRESSION: VASCULAR 1. No evidence of acute or chronic occlusive mesenteric ischemia. 2. Trace pericardial effusion. 3. 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 4. Coronary and aortic  atherosclerosis (ICD10-I70.0). NON VASCULAR 1. No acute abnormality in the chest, abdomen, or pelvis. 2. 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 Consider adrenal mass protocol CT follow-up in 2 years. ([employer], [employer], [employer] GL, Francis IR, [place] GM, [employer] [place] [employer], [employer], Pandharipande [place]. Management of Incidental Adrenal Masses: A White Paper of the ACR Incidental Findings Committee. J Am Coll Radiol. 2017 Aug;14(8):1038-1044. doi: 10.1016/j.jacr.2017.05.001. Epub 2017 Jun 23. PMID: 02542706.) 3. Evidence of prior granulomatous disease. Marliss Coots, MD [facility] Radiology Electronically Signed   By: Marliss Coots M.D.   On: 08/23/2021 16:46   CT Angio Abd/Pel w/ and/or w/o  Result Date: 08/23/2021 CLINICAL DATA:  80 year old female with unintended weight loss, nausea, vomiting. EXAM: CT ANGIOGRAPHY ABDOMEN AND PELVIS CT CHEST WITH CONTAST TECHNIQUE: Non-contrast CT of the chest was initially obtained. Multidetector CT imaging through the chest, abdomen and pelvis was performed using the standard protocol during bolus administration of intravenous contrast. Multiplanar reconstructed images and MIPs were obtained and reviewed to evaluate the vascular anatomy. CONTRAST:  OMNIPAQUE IOHEXOL 350 MG/ML SOLN COMPARISON:  None available. FINDINGS: CT CHEST FINDINGS Cardiovascular: Trace pericardial effusion, measuring up to 8 mm in thickness just anterior to the right ventricle. Status post endovascular aortic valvular repair without complicating features. Scattered atherosclerotic calcifications of the normal caliber thoracic aorta. Severe coronary atherosclerotic calcifications. Coarse mitral annular calcifications are noted. The heart is normal in size. No evidence of central pulmonary embolism. Mediastinum/Nodes: Prominent pretracheal lymph node measuring up to 8 mm in short axis. No additional enlarged  mediastinal, hilar, or axillary lymph nodes. Thyroid gland, trachea, and esophagus demonstrate no significant findings. Lungs/Pleura: No focal consolidations. There are few punctate calcified granulomas in the peripheral right upper lobe. No suspicious pulmonary nodules. No pleural effusion or pneumothorax. Musculoskeletal: Mild multilevel degenerative changes of the thoracic spine. No acute osseous abnormality. No aggressive appearing osseous lesion. CTA  ABDOMEN AND PELVIS FINDINGS VASCULAR Aorta: Normal caliber and patent throughout. Scattered circumferential atherosclerotic calcifications. Celiac: Mild ostial stenosis secondary to atherosclerotic plaque. Otherwise widely patent. SMA: Mild ostial stenosis secondary to atherosclerotic plaque. Otherwise widely patent. Renals: Single bilateral renal arteries are patent without evidence of aneurysm, dissection, vasculitis, fibromuscular dysplasia or significant stenosis. IMA: Patent without evidence of aneurysm, dissection, vasculitis or significant stenosis. Inflow: Patent without evidence of aneurysm, dissection, vasculitis or significant stenosis. Veins: The hepatic veins are widely patent. Portal system is widely patent and normal in caliber. The renal veins are normal in anatomic configuration and appear patent. No evidence of iliocaval thrombosis or anomaly. Review of the MIP images confirms the above findings. NON-VASCULAR Hepatobiliary: No focal liver abnormality is seen. Status post cholecystectomy. No biliary dilatation. Pancreas: Unremarkable. No pancreatic ductal dilatation or surrounding inflammatory changes. Spleen: Normal in size without focal abnormality. Adrenals/Urinary Tract: Hypoattenuating left adrenal mass measuring up to 15 mm. The right adrenal gland is normal in size morphology. Kidneys are normal in size enhancement bilaterally. No hydronephrosis, nephrolithiasis, or focal lesions. The bilateral ureters are decompressed. The bladder is  relatively decompressed and unremarkable. Stomach/Bowel: Stomach is within normal limits. Appendix is not definitively identified. There are few scattered descending and sigmoid colonic diverticula without surrounding inflammatory changes. No evidence of bowel wall thickening, distention, or inflammatory changes. Lymphatic: No abdominopelvic lymphadenopathy. Reproductive: Status post hysterectomy. No adnexal masses. Other: No abdominal wall hernia or abnormality. No abdominopelvic ascites. Musculoskeletal: Mild multilevel degenerative changes of the lumbar spine. No acute osseous abnormality. No aggressive appearing osseous lesion. IMPRESSION: VASCULAR 1. No evidence of acute or chronic occlusive mesenteric ischemia. 2. Trace pericardial effusion. 3. 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 4. Coronary and aortic atherosclerosis (ICD10-I70.0). NON VASCULAR 1. No acute abnormality in the chest, abdomen, or pelvis. 2. 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 Consider adrenal mass protocol CT follow-up in 2 years. ([employer], [employer], [employer] GL, Francis IR, [place] GM, [employer] P[employer], [employer], Pandharipande [place]. Management of Incidental Adrenal Masses: A White Paper of the ACR Incidental Findings Committee. J Am Coll Radiol. 2017 Aug;14(8):1038-1044. doi: 10.1016/j.jacr.2017.05.001. Epub 2017 Jun 23. PMID: 12458099.) 3. Evidence of prior granulomatous disease. Marliss Coots, MD [facility] Radiology Electronically Signed   By: Marliss Coots M.D.   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  Nausea/vomiting/weight loss/apprehensive  to eat: CT chest, CTA abdomen and pelvis with no evidence of mesenteric ischemia, indeterminate but likely benign left adrenal mass measuring 1.5 cm noted.  Patient reportedly has vomiting while chewing food, prior to swallowing.  Symptoms may be secondary to progression of dementia.  Cannot exclude gastric ulcers playing some role but hopefully mostly help with PPI twice daily.  Plans for 20-month follow-up EGD in October.  H. pylori serologies were negative.  Diarrhea: No significant symptoms today.  Plan: Continue PPI twice daily. Plans for outpatient EGD in October. Monitor for recurrent vomiting. GI to follow peripherally.  Leanna Battles. [facility] [phone] 9/8/20222:09 PM    LOS: 3 days

## 2021-08-25 NOTE — Progress Notes (Deleted)
Cardiology Office Note  Date: 08/25/2021   ID: Kathleen Barnett, DOB Nov 13, 1941, MRN [id number]  PCP:  Marylynn Pearson, FNP  Cardiologist:  None Electrophysiologist:  None   Chief Complaint: Hospital follow up  History of Present Illness: Kathleen Barnett is a 80 y.o. female with a history of   Past Medical History:  Diagnosis Date   Dementia (HCC)    Diabetes mellitus without complication (HCC)     Past Surgical History:  Procedure Laterality Date   ABDOMINAL HYSTERECTOMY     APPENDECTOMY     BIOPSY  07/08/2021   Procedure: BIOPSY;  Surgeon: Dolores Frame, MD;  Location: [facility];  Service: Gastroenterology;;   CATARACT EXTRACTION W/PHACO Right 12/29/2019   Procedure: CATARACT EXTRACTION PHACO AND INTRAOCULAR LENS PLACEMENT RIGHT EYE;  Surgeon: Fabio Pierce, MD;  Location: [facility];  Service: Ophthalmology;  Laterality: Right;  CDE: 18.10   CATARACT EXTRACTION W/PHACO Left 01/26/2020   Procedure: CATARACT EXTRACTION PHACO AND INTRAOCULAR LENS PLACEMENT (IOC);  Surgeon: Fabio Pierce, MD;  Location: [facility];  Service: Ophthalmology;  Laterality: Left;  CDE: 8.22   CHOLECYSTECTOMY     ESOPHAGOGASTRODUODENOSCOPY (EGD) WITH PROPOFOL N/A 07/08/2021   Procedure: ESOPHAGOGASTRODUODENOSCOPY (EGD) WITH PROPOFOL;  Surgeon: Dolores Frame, MD;  Location: [facility];  Service: Gastroenterology;  Laterality: N/A;    No current facility-administered medications for this visit.   No current outpatient medications on file.   Facility-Administered Medications Ordered in Other Visits  Medication Dose Route Frequency Provider Last Rate Last Admin   (feeding supplement) PROSource Plus liquid 30 mL  30 mL Oral TID BM Vassie Loll, MD   30 mL at 08/25/21 1953   0.9 % NaCl with KCl 40 mEq / L  infusion   Intravenous Continuous Tat, Onalee Hua, MD 75 mL/hr at 08/25/21 1113 New Bag at 08/25/21 1113   acetaminophen (TYLENOL) tablet 650 mg  650 mg Oral Q6H PRN Emokpae,  Ejiroghene E, MD   650 mg at 08/25/21 1953   Or   acetaminophen (TYLENOL) suppository 650 mg  650 mg Rectal Q6H PRN Emokpae, Ejiroghene E, MD       donepezil (ARICEPT) tablet 10 mg  10 mg Oral Daily Vassie Loll, MD   10 mg at 08/25/21 2112   feeding supplement (BOOST / RESOURCE BREEZE) liquid 1 Container  1 Container Oral TID BM Vassie Loll, MD   1 Container at 08/25/21 1959   insulin aspart (novoLOG) injection 0-9 Units  0-9 Units Subcutaneous Q8H Emokpae, Ejiroghene E, MD   1 Units at 08/25/21 1638   isosorbide mononitrate (IMDUR) 24 hr tablet 30 mg  30 mg Oral Daily Vassie Loll, MD   30 mg at 08/25/21 0908   memantine (NAMENDA) tablet 10 mg  10 mg Oral BID Vassie Loll, MD   10 mg at 08/25/21 2111   multivitamin with minerals tablet 1 tablet  1 tablet Oral Daily Vassie Loll, MD   1 tablet at 08/25/21 0909   ondansetron (ZOFRAN) injection 4 mg  4 mg Intravenous Q6H PRN Emokpae, Ejiroghene E, MD   4 mg at 08/21/21 2359   pantoprazole (PROTONIX) injection 40 mg  40 mg Intravenous Q12H Dolores Frame, MD   40 mg at 08/25/21 2112   QUEtiapine (SEROQUEL) tablet 25 mg  25 mg Oral QHS Vassie Loll, MD   25 mg at 08/25/21 2111   sucralfate (CARAFATE) 1 GM/10ML suspension 1 g  1 g Oral TID Vassie Loll, MD  1 g at 08/25/21 2111   traZODone (DESYREL) tablet 150 mg  150 mg Oral QHS Vassie Loll, MD   150 mg at 08/25/21 2111   Allergies:  Patient has no known allergies.   Social History: The patient  reports that she has been smoking cigarettes. She has been smoking an average of .25 packs per day. She has never used smokeless tobacco. She reports that she does not currently use alcohol. She reports that she does not use drugs.   Family History: The patient's family history is not on file.   ROS:  Please see the history of present illness. Otherwise, complete review of systems is positive for {NONE DEFAULTED:18576}.  All other systems are reviewed and negative.   Physical  Exam: VS:  There were no vitals taken for this visit., BMI There is no height or weight on file to calculate BMI.  Wt Readings from Last 3 Encounters:  08/21/21 184 lb (83.5 kg)  07/07/21 178 lb 9.2 oz (81 kg)  08/09/19 206 lb (93.4 kg)    General: Patient appears comfortable at rest. HEENT: Conjunctiva and lids normal, oropharynx clear with moist mucosa. Neck: Supple, no elevated JVP or carotid bruits, no thyromegaly. Lungs: Clear to auscultation, nonlabored breathing at rest. Cardiac: Regular rate and rhythm, no S3 or significant systolic murmur, no pericardial rub. Abdomen: Soft, nontender, no hepatomegaly, bowel sounds present, no guarding or rebound. Extremities: No pitting edema, distal pulses 2+. Skin: Warm and dry. Musculoskeletal: No kyphosis. Neuropsychiatric: Alert and oriented x3, affect grossly appropriate.  ECG:  {EKG/Telemetry Strips Reviewed:[phone]}  Recent Labwork: 07/05/2021: B Natriuretic Peptide 40.0 08/21/2021: ALT 16; AST 28 08/24/2021: Hemoglobin 11.6; Platelets 168; TSH 3.077 08/25/2021: BUN 8; Creatinine, Ser 0.68; Magnesium 1.4; Potassium 3.1; Sodium 141  No results found for: CHOL, TRIG, HDL, CHOLHDL, VLDL, LDLCALC, LDLDIRECT  Other Studies Reviewed Today:  Echocardiogram 08/25/2021  Left ventricular ejection fraction, by estimation, is 60 to 65%. The left ventricle has normal function. The left ventricle has no regional wall motion abnormalities. Left ventricular diastolic parameters are indeterminate. 1. Right ventricular systolic function is normal. The right ventricular size is normal. Tricuspid regurgitation signal is inadequate for assessing PA pressure. 2. The mitral valve is normal in structure. No evidence of mitral valve regurgitation. No evidence of mitral stenosis. 3. 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 . The aortic valve has been repaired/replaced. Aortic valve regurgitation is not visualized.  No aortic stenosis is present. 4. The inferior vena cava is normal in size with greater than 50% respiratory variability, suggesting right atrial pressure of 3 mmHg.  Assessment and Plan:  No diagnosis found.   Medication Adjustments/Labs and Tests Ordered: Current medicines are reviewed at length with the patient today.  Concerns regarding medicines are outlined above.   Disposition: Follow-up with ***  Signed, Rennis Harding, NP 08/25/2021 11:35 PM    [facility] [address] [place], [place], [place] [address] Phone: [phone]; Fax: [phone]

## 2021-08-25 NOTE — Consult Note (Signed)
Cardiology Consultation:   Patient ID: EVY LUTTERMAN MRN: [id number]; DOB: Apr 28, 1941  Admit date: 08/21/2021 Date of Consult: 08/25/2021  PCP:  Marylynn Pearson, FNP   [facility] Providers Cardiologist:  None        Patient Profile:   MARGARETTA CHITTUM is a 80 y.o. female with a hx of afib and AVR who is being seen 08/25/2021 for the evaluation of afib at the request of Dr Tat.  History of Present Illness:   Ms. Vallo 80 yo female history of afib, DM, history of AVR unknown type, dementia admitted with GI bleed in setting of elevated INR. Admission for the same in 06/2021 with supratherapeutic INR and GI bleed. Cardiology consulted to evluate potentially changing from coumadin to DOAC in setting of afib with artificial heart valve.     Admit labs K 2.6 BUN 31 Cr 1.4 (baseline 0.7) WBC 12.5 Hgb 14 Plt 207 Mg 1.3 FOBT +  INR >10  COVID neg   Past Medical History:  Diagnosis Date   Dementia (HCC)    Diabetes mellitus without complication (HCC)     Past Surgical History:  Procedure Laterality Date   ABDOMINAL HYSTERECTOMY     APPENDECTOMY     BIOPSY  07/08/2021   Procedure: BIOPSY;  Surgeon: Dolores Frame, MD;  Location: [facility];  Service: Gastroenterology;;   CATARACT EXTRACTION W/PHACO Right 12/29/2019   Procedure: CATARACT EXTRACTION PHACO AND INTRAOCULAR LENS PLACEMENT RIGHT EYE;  Surgeon: Fabio Pierce, MD;  Location: [facility];  Service: Ophthalmology;  Laterality: Right;  CDE: 18.10   CATARACT EXTRACTION W/PHACO Left 01/26/2020   Procedure: CATARACT EXTRACTION PHACO AND INTRAOCULAR LENS PLACEMENT (IOC);  Surgeon: Fabio Pierce, MD;  Location: [facility];  Service: Ophthalmology;  Laterality: Left;  CDE: 8.22   CHOLECYSTECTOMY     ESOPHAGOGASTRODUODENOSCOPY (EGD) WITH PROPOFOL N/A 07/08/2021   Procedure: ESOPHAGOGASTRODUODENOSCOPY (EGD) WITH PROPOFOL;  Surgeon: Dolores Frame, MD;  Location: [facility];  Service: Gastroenterology;  Laterality: N/A;        Inpatient Medications: Scheduled Meds:  (feeding supplement) PROSource Plus  30 mL Oral TID BM   donepezil  10 mg Oral Daily   feeding supplement  1 Container Oral TID BM   insulin aspart  0-9 Units Subcutaneous Q8H   isosorbide mononitrate  30 mg Oral Daily   memantine  10 mg Oral BID   multivitamin with minerals  1 tablet Oral Daily   pantoprazole  40 mg Intravenous Q12H   QUEtiapine  25 mg Oral QHS   sucralfate  1 g Oral TID   traZODone  150 mg Oral QHS   Warfarin - Pharmacist Dosing Inpatient   Does not apply q1600   Continuous Infusions:  0.9 % NaCl with KCl 40 mEq / L 75 mL/hr at 08/24/21 2017   PRN Meds: acetaminophen **OR** acetaminophen, ondansetron (ZOFRAN) IV  Allergies:   No Known Allergies  Social History:   Social History   Socioeconomic History   Marital status: Single    Spouse name: Not on file   Number of children: Not on file   Years of education: Not on file   Highest education level: Not on file  Occupational History   Not on file  Tobacco Use   Smoking status: Every Day    Packs/day: 0.25    Types: Cigarettes   Smokeless tobacco: Never  Vaping Use   Vaping Use: Never used  Substance and Sexual Activity   Alcohol use: Not Currently  Drug use: Never   Sexual activity: Not on file  Other Topics Concern   Not on file  Social History Narrative   Not on file   Social Determinants of Health   Financial Resource Strain: Not on file  Food Insecurity: Not on file  Transportation Needs: Not on file  Physical Activity: Not on file  Stress: Not on file  Social Connections: Not on file  Intimate Partner Violence: Not on file    Family History:    Family History  Problem Relation Age of Onset   Colon cancer Neg Hx      ROS:  Please see the history of present illness.   All other ROS reviewed and negative.     Physical Exam/Data:   Vitals:   08/24/21 0613 08/24/21 2011 08/25/21 0326 08/25/21 0900  BP: 124/78 129/90 (!)  144/94 105/83  Pulse: 75 93 74 80  Resp: 16 20 19    Temp: 98.4 F (36.9 C) (!) 97.5 F (36.4 C) 98.2 F (36.8 C)   TempSrc: Oral Oral Oral   SpO2: 99% 98% 98%   Weight:      Height:        Intake/Output Summary (Last 24 hours) at 08/25/2021 0931 Last data filed at 08/25/2021 0900 Gross per 24 hour  Intake 1455.19 ml  Output --  Net 1455.19 ml   Last 3 Weights 08/21/2021 07/07/2021 07/05/2021  Weight (lbs) 184 lb 178 lb 9.2 oz 180 lb 12.4 oz  Weight (kg) 83.462 kg 81 kg 82 kg     Body mass index is 28.82 kg/m.  General:  Well nourished, well developed, in no acute distress HEENT: normal Lymph: no adenopathy Neck: no JVD Endocrine:  No thryomegaly Vascular: No carotid bruits; FA pulses 2+ bilaterally without bruits  Cardiac:  normal S1, S2; RRR; no murmur  Lungs:  clear to auscultation bilaterally, no wheezing, rhonchi or rales  Abd: soft, nontender, no hepatomegaly  Ext: no edema Musculoskeletal:  No deformities, BUE and BLE strength normal and equal Skin: warm and dry  Neuro:  CNs 2-12 intact, no focal abnormalities noted Psych:  Normal affect    Laboratory Data:  High Sensitivity Troponin:  No results for input(s): TROPONINIHS in the last 720 hours.   Chemistry Recent Labs  Lab 08/23/21 0553 08/24/21 0547 08/25/21 0634  NA 137 136 141  K 3.2* 2.2* 3.1*  CL 101 103 106  CO2 20* 24 28  GLUCOSE 109* 86 93  BUN 16 12 8   CREATININE 0.79 0.72 0.68  CALCIUM 7.7* 7.2* 7.9*  GFRNONAA >60 >60 >60  ANIONGAP 16* 9 7    Recent Labs  Lab 08/21/21 1830  PROT 6.9  ALBUMIN 3.8  AST 28  ALT 16  ALKPHOS 67  BILITOT 1.2   Hematology Recent Labs  Lab 08/22/21 0522 08/23/21 0553 08/24/21 0547  WBC 10.1 7.9 6.6  RBC 4.00 4.20 3.95  HGB 12.1 12.4 11.6*  HCT 35.3* 37.4 34.2*  MCV 88.3 89.0 86.6  MCH 30.3 29.5 29.4  MCHC 34.3 33.2 33.9  RDW 14.6 14.8 14.7  PLT 164 182 168   BNPNo results for input(s): BNP, PROBNP in the last 168 hours.  DDimer No results for  input(s): DDIMER in the last 168 hours.   Radiology/Studies:  CT CHEST W CONTRAST  Result Date: 08/23/2021 CLINICAL DATA:  80 year old female with unintended weight loss, nausea, vomiting. EXAM: CT ANGIOGRAPHY ABDOMEN AND PELVIS CT CHEST WITH CONTAST TECHNIQUE: Non-contrast CT of the chest was  initially obtained. Multidetector CT imaging through the chest, abdomen and pelvis was performed using the standard protocol during bolus administration of intravenous contrast. Multiplanar reconstructed images and MIPs were obtained and reviewed to evaluate the vascular anatomy. CONTRAST:  OMNIPAQUE IOHEXOL 350 MG/ML SOLN COMPARISON:  None available. FINDINGS: CT CHEST FINDINGS Cardiovascular: Trace pericardial effusion, measuring up to 8 mm in thickness just anterior to the right ventricle. Status post endovascular aortic valvular repair without complicating features. Scattered atherosclerotic calcifications of the normal caliber thoracic aorta. Severe coronary atherosclerotic calcifications. Coarse mitral annular calcifications are noted. The heart is normal in size. No evidence of central pulmonary embolism. Mediastinum/Nodes: Prominent pretracheal lymph node measuring up to 8 mm in short axis. No additional enlarged mediastinal, hilar, or axillary lymph nodes. Thyroid gland, trachea, and esophagus demonstrate no significant findings. Lungs/Pleura: No focal consolidations. There are few punctate calcified granulomas in the peripheral right upper lobe. No suspicious pulmonary nodules. No pleural effusion or pneumothorax. Musculoskeletal: Mild multilevel degenerative changes of the thoracic spine. No acute osseous abnormality. No aggressive appearing osseous lesion. CTA ABDOMEN AND PELVIS FINDINGS VASCULAR Aorta: Normal caliber and patent throughout. Scattered circumferential atherosclerotic calcifications. Celiac: Mild ostial stenosis secondary to atherosclerotic plaque. Otherwise widely patent. SMA: Mild ostial  stenosis secondary to atherosclerotic plaque. Otherwise widely patent. Renals: Single bilateral renal arteries are patent without evidence of aneurysm, dissection, vasculitis, fibromuscular dysplasia or significant stenosis. IMA: Patent without evidence of aneurysm, dissection, vasculitis or significant stenosis. Inflow: Patent without evidence of aneurysm, dissection, vasculitis or significant stenosis. Veins: The hepatic veins are widely patent. Portal system is widely patent and normal in caliber. The renal veins are normal in anatomic configuration and appear patent. No evidence of iliocaval thrombosis or anomaly. Review of the MIP images confirms the above findings. NON-VASCULAR Hepatobiliary: No focal liver abnormality is seen. Status post cholecystectomy. No biliary dilatation. Pancreas: Unremarkable. No pancreatic ductal dilatation or surrounding inflammatory changes. Spleen: Normal in size without focal abnormality. Adrenals/Urinary Tract: Hypoattenuating left adrenal mass measuring up to 15 mm. The right adrenal gland is normal in size morphology. Kidneys are normal in size enhancement bilaterally. No hydronephrosis, nephrolithiasis, or focal lesions. The bilateral ureters are decompressed. The bladder is relatively decompressed and unremarkable. Stomach/Bowel: Stomach is within normal limits. Appendix is not definitively identified. There are few scattered descending and sigmoid colonic diverticula without surrounding inflammatory changes. No evidence of bowel wall thickening, distention, or inflammatory changes. Lymphatic: No abdominopelvic lymphadenopathy. Reproductive: Status post hysterectomy. No adnexal masses. Other: No abdominal wall hernia or abnormality. No abdominopelvic ascites. Musculoskeletal: Mild multilevel degenerative changes of the lumbar spine. No acute osseous abnormality. No aggressive appearing osseous lesion. IMPRESSION: VASCULAR 1. No evidence of acute or chronic occlusive  mesenteric ischemia. 2. Trace pericardial effusion. 3. 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 4. Coronary and aortic atherosclerosis (ICD10-I70.0). NON VASCULAR 1. No acute abnormality in the chest, abdomen, or pelvis. 2. 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 Consider adrenal mass protocol CT follow-up in 2 years. ([employer], [employer], Boland GL, Francis IR, [place] GM, [place] PJ, [place] [employer], Pandharipande [place]. Management of Incidental Adrenal Masses: A White Paper of the ACR Incidental Findings Committee. J Am Coll Radiol. 2017 Aug;14(8):1038-1044. doi: 10.1016/j.jacr.2017.05.001. Epub 2017 Jun 23. PMID: 16109604.) 3. Evidence of prior granulomatous disease. Marliss Coots, MD [facility] Radiology Electronically Signed   By: Marliss Coots M.D.   On: 08/23/2021 16:46   CT Angio Abd/Pel w/ and/or  w/o  Result Date: 08/23/2021 CLINICAL DATA:  80 year old female with unintended weight loss, nausea, vomiting. EXAM: CT ANGIOGRAPHY ABDOMEN AND PELVIS CT CHEST WITH CONTAST TECHNIQUE: Non-contrast CT of the chest was initially obtained. Multidetector CT imaging through the chest, abdomen and pelvis was performed using the standard protocol during bolus administration of intravenous contrast. Multiplanar reconstructed images and MIPs were obtained and reviewed to evaluate the vascular anatomy. CONTRAST:  100mL OMNIPAQUE IOHEXOL 350 MG/ML SOLN COMPARISON:  None available. FINDINGS: CT CHEST FINDINGS Cardiovascular: Trace pericardial effusion, measuring up to 8 mm in thickness just anterior to the right ventricle. Status post endovascular aortic valvular repair without complicating features. Scattered atherosclerotic calcifications of the normal caliber thoracic aorta. Severe coronary atherosclerotic calcifications. Coarse mitral annular calcifications are noted. The heart is normal in size. No evidence of central pulmonary  embolism. Mediastinum/Nodes: Prominent pretracheal lymph node measuring up to 8 mm in short axis. No additional enlarged mediastinal, hilar, or axillary lymph nodes. Thyroid gland, trachea, and esophagus demonstrate no significant findings. Lungs/Pleura: No focal consolidations. There are few punctate calcified granulomas in the peripheral right upper lobe. No suspicious pulmonary nodules. No pleural effusion or pneumothorax. Musculoskeletal: Mild multilevel degenerative changes of the thoracic spine. No acute osseous abnormality. No aggressive appearing osseous lesion. CTA ABDOMEN AND PELVIS FINDINGS VASCULAR Aorta: Normal caliber and patent throughout. Scattered circumferential atherosclerotic calcifications. Celiac: Mild ostial stenosis secondary to atherosclerotic plaque. Otherwise widely patent. SMA: Mild ostial stenosis secondary to atherosclerotic plaque. Otherwise widely patent. Renals: Single bilateral renal arteries are patent without evidence of aneurysm, dissection, vasculitis, fibromuscular dysplasia or significant stenosis. IMA: Patent without evidence of aneurysm, dissection, vasculitis or significant stenosis. Inflow: Patent without evidence of aneurysm, dissection, vasculitis or significant stenosis. Veins: The hepatic veins are widely patent. Portal system is widely patent and normal in caliber. The renal veins are normal in anatomic configuration and appear patent. No evidence of iliocaval thrombosis or anomaly. Review of the MIP images confirms the above findings. NON-VASCULAR Hepatobiliary: No focal liver abnormality is seen. Status post cholecystectomy. No biliary dilatation. Pancreas: Unremarkable. No pancreatic ductal dilatation or surrounding inflammatory changes. Spleen: Normal in size without focal abnormality. Adrenals/Urinary Tract: Hypoattenuating left adrenal mass measuring up to 15 mm. The right adrenal gland is normal in size morphology. Kidneys are normal in size enhancement  bilaterally. No hydronephrosis, nephrolithiasis, or focal lesions. The bilateral ureters are decompressed. The bladder is relatively decompressed and unremarkable. Stomach/Bowel: Stomach is within normal limits. Appendix is not definitively identified. There are few scattered descending and sigmoid colonic diverticula without surrounding inflammatory changes. No evidence of bowel wall thickening, distention, or inflammatory changes. Lymphatic: No abdominopelvic lymphadenopathy. Reproductive: Status post hysterectomy. No adnexal masses. Other: No abdominal wall hernia or abnormality. No abdominopelvic ascites. Musculoskeletal: Mild multilevel degenerative changes of the lumbar spine. No acute osseous abnormality. No aggressive appearing osseous lesion. IMPRESSION: VASCULAR 1. No evidence of acute or chronic occlusive mesenteric ischemia. 2. Trace pericardial effusion. 3. 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 4. Coronary and aortic atherosclerosis (ICD10-I70.0). NON VASCULAR 1. No acute abnormality in the chest, abdomen, or pelvis. 2. 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 Consider adrenal mass protocol CT follow-up in 2 years. ([employer], [employer], Boland GL, Francis IR, [place] GM, [place] [employer], [place] [employer] [employer], Pandharipande [place]. Management of Incidental Adrenal Masses: A White Paper of the ACR Incidental Findings Committee. J Am Coll Radiol. 2017 Aug;14(8):1038-1044. doi: 10.1016/j.jacr.2017.05.001. Epub 2017 Jun 23. PMID: [id number].) 3. Evidence of prior  granulomatous disease. Marliss Coots, MD [facility] Radiology Electronically Signed   By: Marliss Coots M.D.   On: 08/23/2021 16:46     Assessment and Plan:   1.Afib - recurrent admissions with supratherapeutic INRs and GI bleeds - she has been on coumadin for her afib. History of AVR unknown type, whether metal or bioprosthetic. Try to obtain prior records, f/u echo  which may shed some light on valve type. If tissue valve could change to DOAC  2. History of AVR - she does not know details. She is unsure when it was done. Thinks it was done in [place], she is not sure what center though states she lived near [place] - daughter reports valve was done at [facility] 6 years ago, they "went through the groin".  - will try to get records, evidence so far tells [place] this is a tissue valve and would be ok to transition from coumadin to eliquis when ok from bleeding standpoint and INR back WNL.         For questions or updates, please contact [facility] Please consult [email or web address] for contact info under    Signed, Dina Rich, MD  08/25/2021 9:31 AM

## 2021-08-25 NOTE — Progress Notes (Signed)
PROGRESS NOTE  Kathleen Barnett EHU:[id number] DOB: 16-Jun-1941 DOA: 08/21/2021 PCP: Marylynn Pearson, FNP   Brief History:  As per H&P written by Dr. Mariea Clonts on 08/21/2021 Kathleen Barnett is a 80 y.o. female with medical history significant for atrial fibrillation on chronic anticoagulation with Coumadin, diabetes mellitus. Was brought to the ED ports of generalized weakness, vomiting, nausea and weight loss.  Daughter also reported patient's INR has been elevated.  The time of my evaluation daughter has left, patient is not exactly sure sure why she was brought to the hospital.  She denies abdominal pain.  Reports generalized weakness.   Patient was admitted 7/19-7/23 for supratherapeutic INR of greater than 10, with melena, symptomatic anemia.  Patient required FFP x1 unit, was given vitamin K.  EGD done showed several gastric ulcers.    ED Course: Stable vitals, blood pressure 105 242.  Globin stable 14.  INR greater than 10.  Potassium 2.6. 1.4.  Stool FOBT positive.  2.5 mg vitamin K given.  Hospitalist to admit.   Assessment/Plan: supratherapeutic INR with melena and -With INR more than 10 on presentation -Hemoglobin has remained stable -After expressing plasma and vitamin K INR 2.8 -Continue to follow hemoglobin trend -Gastroenterology service has been consulted and planning conservative management for now no endoscopic intervention planned.  -advance to full liquid>>dys 1 diet -continue PPI   history of peptic ulcer disease -Continue avoiding NSAIDs. -Maintain INR therapeutic and control. -Continue to follow hemoglobin trend; no transfusion needed. -Continue PPI.   hypokalemia, hypomagnesemia and hyponatremia -Continue electrolytes repletion. -Follow electrolytes trend.   weakness, vomiting and weight loss/FTT -Patient feeling better after fluid resuscitation. -Continue to provide repletion. -UA--no pyuria -check B12--547 -check folate--9.5 -TSH--3.077 -9/6 CT  chest--punctate calcified granulomas in the peripheral right upper lobe; s/p endovascular aortic valvular repair without complicating features -9/6 CT abd/pelvis--no acute findings   controlled type 2 diabetes mellitus -Continue holding oral hypoglycemic agent -Follow CBGs.   dementia with behavioral disturbance and high risk for hospital-acquired delirium. -Resume Aricept, Namenda and Seroquel. -Resume trazodone at nighttime -Continue constant reorientation.   hyperlipidemia -Continue holding statin for now.   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 -baseline creatinine    aortic valve replacement--bioprosthetic -Continue Coumadin  -does not appear to be mechanical on exam -consult cardiology -Echo 60-65%, no WMA, bioprosthetic AVR -d/c coumadin -plan to start NOAC when INR subtherapeutic give recurrent admissions for INR >10             Status is: Inpatient   Remains inpatient appropriate because:Inpatient level of care appropriate due to severity of illness   Dispo: The patient is from: Home              Anticipated d/c is to: Home              Patient currently not medically stable for d/c              Difficult to place patient No               Family Communication:   daughter updated 9/8   Consultants:  cardiology, GI   Code Status:  FULL   DVT Prophylaxis:  coumadin     Procedures: As Listed in Progress Note Above   Antibiotics:  None     Subjective: Patient denies fevers, chills, headache, chest pain, dyspnea, nausea, vomiting, diarrhea, abdominal pain, dysuria, hematuria, hematochezia, and melena.   Objective: Vitals:   08/24/21 2011 08/25/21 0326 08/25/21 0900 08/25/21 1400  BP: 129/90 (!) 144/94 105/83 110/76  Pulse: 93 74 80  87  Resp: Temp: (!) 97.5 F (36.4 C) 98.2 F (36.8 C)  98.6 F (37 C)  TempSrc: Oral Oral  Oral  SpO2: 98% 98%  98%  Weight:      Height:        Intake/Output Summary (Last 24 hours) at 08/25/2021 1706 Last data filed at 08/25/2021 1500 Gross per 24 hour  Intake 480 ml  Output --  Net 480 ml   Weight change:  Exam:  General:  Pt is alert, follows commands appropriately, not in acute distress HEENT: No icterus, No thrush, No neck mass, [place]/AT Cardiovascular: RRR, S1/S2, no rubs, no gallops Respiratory: fine bibasilar rales. No wheeze Abdomen: Soft/+BS, non tender, non distended, no guarding Extremities: Nonpitting edema, No lymphangitis, No petechiae, No rashes, no synovitis   Data Reviewed: I have personally reviewed following labs and imaging studies Basic Metabolic Panel: Recent Labs  Lab 08/21/21 1830 08/22/21 0108 08/23/21 0553 08/24/21 0547 08/25/21 0634  NA 132* 133* 137 136 141  K 2.6* 2.9* 3.2* 2.2* 3.1*  CL 92* 95* 101 103 106  CO2 20* 20* 20* 24 28  GLUCOSE 100* 112* 109* 86 93  BUN 31* 32* CREATININE 1.40* 1.44* 0.79 0.72 0.68  CALCIUM 8.0* 7.7* 7.7* 7.2* 7.9*  MG 1.3*  --  1.2* 1.5* 1.4*   Liver Function Tests: Recent Labs  Lab 08/21/21 1830  AST 28  ALT 16  ALKPHOS 67  BILITOT 1.2  PROT 6.9  ALBUMIN 3.8   No results for input(s): LIPASE, AMYLASE in the last 168 hours. No results for input(s): AMMONIA in the last 168 hours. Coagulation Profile: Recent Labs  Lab 08/21/21 1830 08/22/21 0522 08/23/21 0553 08/24/21 0547 08/25/21 0634  INR >10.0* 2.8* 2.5* 3.5* 3.5*   CBC: Recent Labs  Lab 08/21/21 1830 08/22/21 0108 08/22/21 0522 08/23/21 0553 08/24/21 0547  WBC 12.5* 13.7* 10.1 7.9 6.6  NEUTROABS 9.8*  --   --   --   --   HGB 14.0 13.6 12.1 12.4 11.6*  HCT 40.5 40.9 35.3* 37.4 34.2*  MCV 87.3 89.1 88.3 89.0 86.6  PLT 207 170 164 182 168   Cardiac Enzymes: No results for input(s): CKTOTAL, CKMB, CKMBINDEX,  TROPONINI in the last 168 hours. BNP: Invalid input(s): POCBNP CBG: Recent Labs  Lab 08/24/21 2011 08/25/21 0616 08/25/21 0749 08/25/21 0758 08/25/21 1546  GLUCAP 149* 98 98 102* 129*   HbA1C: No results for input(s): HGBA1C in the last 72 hours. Urine analysis:    Component Value Date/Time   COLORURINE YELLOW 08/23/2021 1616   APPEARANCEUR CLEAR 08/23/2021 1616   LABSPEC 1.025 08/23/2021 1616   PHURINE 6.0 08/23/2021 1616   GLUCOSEU NEGATIVE 08/23/2021 1616   HGBUR TRACE (A) 08/23/2021 1616   BILIRUBINUR SMALL (A) 08/23/2021 1616   KETONESUR >80 (A) 08/23/2021 1616   PROTEINUR TRACE (A) 08/23/2021 1616   NITRITE NEGATIVE 08/23/2021 1616   LEUKOCYTESUR TRACE (A) 08/23/2021 1616   Sepsis Labs: (procalcitonin:4,lacticidven:4) ) Recent Results (from the past 240 hour(s))  Resp Panel by RT-PCR (Flu A&B, Covid) Nasopharyngeal Swab     Status: None   Collection Time: 08/21/21  7:12 PM  Specimen: Nasopharyngeal Swab; Nasopharyngeal(NP) swabs in vial transport medium  Result Value Ref Range Status   SARS Coronavirus 2 by RT PCR NEGATIVE NEGATIVE Final    Comment: (NOTE) SARS-CoV-2 target nucleic acids are NOT DETECTED.  The SARS-CoV-2 RNA is generally detectable in upper respiratory specimens during the acute phase of infection. The lowest concentration of SARS-CoV-2 viral copies this assay can detect is 138 copies/mL. A negative result does not preclude SARS-Cov-2 infection and should not be used as the sole basis for treatment or other patient management decisions. A negative result may occur with  improper specimen collection/handling, submission of specimen other than nasopharyngeal swab, presence of viral mutation(s) within the areas targeted by this assay, and inadequate number of viral copies(<138 copies/mL). A negative result must be combined with clinical observations, patient history, and epidemiological information. The expected result is  Negative.  Fact Sheet for Patients:  BloggerCourse.comhttps://www.fda.gov/media/152166/download  Fact Sheet for Healthcare Providers:  SeriousBroker.ithttps://www.fda.gov/media/152162/download  This test is no t yet approved or cleared by the [place] FDA and  has been authorized for detection and/or diagnosis of SARS-CoV-2 by FDA under an Emergency Use Authorization (EUA). This EUA will remain  in effect (meaning this test can be used) for the duration of the COVID-19 declaration under Section 564(b)(1) of the Act, 21 U.S.C.section 360bbb-3(b)(1), unless the authorization is terminated  or revoked sooner.       Influenza A by PCR NEGATIVE NEGATIVE Final   Influenza B by PCR NEGATIVE NEGATIVE Final    Comment: (NOTE) The Xpert Xpress SARS-CoV-2/FLU/RSV plus assay is intended as an aid in the diagnosis of influenza from Nasopharyngeal swab specimens and should not be used as a sole basis for treatment. Nasal washings and aspirates are unacceptable for Xpert Xpress SARS-CoV-2/FLU/RSV testing.  Fact Sheet for Patients: BloggerCourse.comhttps://www.fda.gov/media/152166/download  Fact Sheet for Healthcare Providers: SeriousBroker.ithttps://www.fda.gov/media/152162/download  This test is not yet approved or cleared by the [place] FDA and has been authorized for detection and/or diagnosis of SARS-CoV-2 by FDA under an Emergency Use Authorization (EUA). This EUA will remain in effect (meaning this test can be used) for the duration of the COVID-19 declaration under Section 564(b)(1) of the Act, 21 U.S.C. section 360bbb-3(b)(1), unless the authorization is terminated or revoked.  Performed at [facility], [address] [place], [place] [address]   Urine Culture     Status: Abnormal   Collection Time: 08/23/21  4:16 PM   Specimen: Urine, Catheterized  Result Value Ref Range Status   Specimen Description   Final    URINE, CATHETERIZED Performed at [facility], [address] [place], [place] [address]    Special Requests    Final    NONE Performed at [facility], [address] [place], [place] [address]    Culture MULTIPLE SPECIES PRESENT, SUGGEST RECOLLECTION (A)  Final   Report Status 08/25/2021 FINAL  Final     Scheduled Meds:  (feeding supplement) PROSource Plus  30 mL Oral TID BM   donepezil  10 mg Oral Daily   feeding supplement  1 Container Oral TID BM   insulin aspart  0-9 Units Subcutaneous Q8H   isosorbide mononitrate  30 mg Oral Daily   memantine  10 mg Oral BID   multivitamin with minerals  1 tablet Oral Daily   pantoprazole  40 mg Intravenous Q12H   QUEtiapine  25 mg Oral QHS   sucralfate  1 g Oral TID   traZODone  150 mg Oral QHS   Continuous Infusions:  0.9 % NaCl with KCl 40 mEq / L 75 mL/hr at 08/25/21 1113   potassium chloride 10 mEq (08/25/21 1637)    Procedures/Studies: CT CHEST W CONTRAST  Result Date: 08/23/2021 CLINICAL DATA:  80 year old female with unintended weight loss, nausea, vomiting. EXAM: CT ANGIOGRAPHY ABDOMEN AND PELVIS CT CHEST WITH CONTAST TECHNIQUE: Non-contrast CT of the chest was initially obtained. Multidetector CT imaging through the chest, abdomen and pelvis was performed using the standard protocol during bolus administration of intravenous contrast. Multiplanar reconstructed images and MIPs were obtained and reviewed to evaluate the vascular anatomy. CONTRAST:  OMNIPAQUE IOHEXOL 350 MG/ML SOLN COMPARISON:  None available. FINDINGS: CT CHEST FINDINGS Cardiovascular: Trace pericardial effusion, measuring up to 8 mm in thickness just anterior to the right ventricle. Status post endovascular aortic valvular repair without complicating features. Scattered atherosclerotic calcifications of the normal caliber thoracic aorta. Severe coronary atherosclerotic calcifications. Coarse mitral annular calcifications are noted. The heart is normal in size. No evidence of central pulmonary embolism. Mediastinum/Nodes: Prominent pretracheal lymph node measuring up to 8 mm in  short axis. No additional enlarged mediastinal, hilar, or axillary lymph nodes. Thyroid gland, trachea, and esophagus demonstrate no significant findings. Lungs/Pleura: No focal consolidations. There are few punctate calcified granulomas in the peripheral right upper lobe. No suspicious pulmonary nodules. No pleural effusion or pneumothorax. Musculoskeletal: Mild multilevel degenerative changes of the thoracic spine. No acute osseous abnormality. No aggressive appearing osseous lesion. CTA ABDOMEN AND PELVIS FINDINGS VASCULAR Aorta: Normal caliber and patent throughout. Scattered circumferential atherosclerotic calcifications. Celiac: Mild ostial stenosis secondary to atherosclerotic plaque. Otherwise widely patent. SMA: Mild ostial stenosis secondary to atherosclerotic plaque. Otherwise widely patent. Renals: Single bilateral renal arteries are patent without evidence of aneurysm, dissection, vasculitis, fibromuscular dysplasia or significant stenosis. IMA: Patent without evidence of aneurysm, dissection, vasculitis or significant stenosis. Inflow: Patent without evidence of aneurysm, dissection, vasculitis or significant stenosis. Veins: The hepatic veins are widely patent. Portal system is widely patent and normal in caliber. The renal veins are normal in anatomic configuration and appear patent. No evidence of iliocaval thrombosis or anomaly. Review of the MIP images confirms the above findings. NON-VASCULAR Hepatobiliary: No focal liver abnormality is seen. Status post cholecystectomy. No biliary dilatation. Pancreas: Unremarkable. No pancreatic ductal dilatation or surrounding inflammatory changes. Spleen: Normal in size without focal abnormality. Adrenals/Urinary Tract: Hypoattenuating left adrenal mass measuring up to 15 mm. The right adrenal gland is normal in size morphology. Kidneys are normal in size enhancement bilaterally. No hydronephrosis, nephrolithiasis, or focal lesions. The bilateral ureters are  decompressed. The bladder is relatively decompressed and unremarkable. Stomach/Bowel: Stomach is within normal limits. Appendix is not definitively identified. There are few scattered descending and sigmoid colonic diverticula without surrounding inflammatory changes. No evidence of bowel wall thickening, distention, or inflammatory changes. Lymphatic: No abdominopelvic lymphadenopathy. Reproductive: Status post hysterectomy. No adnexal masses. Other: No abdominal wall hernia or abnormality. No abdominopelvic ascites. Musculoskeletal: Mild multilevel degenerative changes of the lumbar spine. No acute osseous abnormality. No aggressive appearing osseous lesion. IMPRESSION: VASCULAR 1. No evidence of acute or chronic occlusive mesenteric ischemia. 2. Trace pericardial effusion. 3. 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 4. Coronary and aortic atherosclerosis (ICD10-I70.0). NON VASCULAR 1. No acute abnormality in the chest, abdomen, or pelvis. 2. 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 Consider adrenal mass protocol CT follow-up in 2 years. ([employer], [employer], Boland GL, Francis IR, [place] GM, [employer], [employer], Pandhar[employer]ande [place]. Management of Incidental  Adrenal Masses: A White Paper of the ACR Incidental Findings Committee. J Am Coll Radiol. 2017 Aug;14(8):1038-1044. doi: 10.1016/j.jacr.2017.05.001. Epub 2017 Jun 23. PMID: [id number].) 3. Evidence of prior granulomatous disease. Marliss Coots, MD [facility] Radiology Electronically Signed   By: Marliss Coots M.D.   On: 08/23/2021 16:46   ECHOCARDIOGRAM COMPLETE  Result Date: 08/25/2021    ECHOCARDIOGRAM REPORT   Patient Name:   Kathleen Barnett Date of Exam: 08/25/2021 Medical Rec #:  [id number]      Height:       67.0 in Accession #:    [id number]     Weight:       184.0 lb Date of Birth:  [birth year]       BSA:          1.952 m Patient Age:    80 years       BP:            144/94 mmHg Patient Gender: F              HR:           95 bpm. [facility] Procedure: 2D Echo, Cardiac Doppler and Color Doppler Indications:    Aortic valve disorder  History:        Patient has no prior history of Echocardiogram examinations.                 Arrythmias:Atrial Fibrillation.  Sonographer:    Mikki Harbor Referring Phys: [phone] Dewanda Fennema IMPRESSIONS  1. Left ventricular ejection fraction, by estimation, is 60 to 65%. The left ventricle has normal function. The left ventricle has no regional wall motion abnormalities. Left ventricular diastolic parameters are indeterminate.  2. Right ventricular systolic function is normal. The right ventricular size is normal. Tricuspid regurgitation signal is inadequate for assessing PA pressure.  3. The mitral valve is normal in structure. No evidence of mitral valve regurgitation. No evidence of mitral stenosis.  4. 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 . The aortic valve has been repaired/replaced. Aortic valve regurgitation is not visualized. No aortic stenosis is present.  5. The inferior vena cava is normal in size with greater than 50% respiratory variability, suggesting right atrial pressure of 3 mmHg. FINDINGS  Left Ventricle: Left ventricular ejection fraction, by estimation, is 60 to 65%. The left ventricle has normal function. The left ventricle has no regional wall motion abnormalities. The left ventricular internal cavity size was normal in size. There is  no left ventricular hypertrophy. Left ventricular diastolic parameters are indeterminate. Right Ventricle: The right ventricular size is normal. Right vetricular wall thickness was not well visualized. Right ventricular systolic function is normal. Tricuspid regurgitation signal is inadequate for assessing PA pressure. Left Atrium: Left atrial size was normal in size. Right Atrium: Right atrial size was normal in size. Pericardium: There is no  evidence of pericardial effusion. Mitral Valve: The mitral valve is normal in structure. No evidence of mitral valve regurgitation. No evidence of mitral valve stenosis. MV peak gradient, 6.5 mmHg. The mean mitral valve gradient is 2.0 mmHg. Tricuspid Valve: The tricuspid valve is normal in structure. Tricuspid valve regurgitation is not demonstrated. No evidence of tricuspid stenosis. 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 The aortic valve has been repaired/replaced. Aortic valve regurgitation is not visualized. No aortic stenosis is present. Aortic valve mean gradient measures 2.0 mmHg.  Aortic valve peak gradient measures 4.1 mmHg. Aortic valve area, by VTI measures 3.06 cm. Pulmonic Valve: The pulmonic valve was not well visualized. Pulmonic valve regurgitation is not visualized. No evidence of pulmonic stenosis. Aorta: The aortic root is normal in size and structure. Venous: The inferior vena cava is normal in size with greater than 50% respiratory variability, suggesting right atrial pressure of 3 mmHg. IAS/Shunts: No atrial level shunt detected by color flow Doppler.  LEFT VENTRICLE PLAX 2D LVIDd:         3.65 cm  Diastology LVIDs:         2.44 cm  LV e' medial:    14.70 cm/s LV PW:         1.00 cm  LV E/e' medial:  5.7 LV IVS:        0.90 cm  LV e' lateral:   8.15 cm/s LVOT diam:     2.00 cm  LV E/e' lateral: 10.3 LV SV:         68 LV SV Index:   35 LVOT Area:     3.14 cm  RIGHT VENTRICLE RV Basal diam:  2.83 cm RV Mid diam:    2.41 cm RV S prime:     12.60 cm/s TAPSE (M-mode): 2.4 cm LEFT ATRIUM             Index       RIGHT ATRIUM           Index LA diam:        4.10 cm 2.10 cm/m  RA Area:     13.80 cm LA Vol (A2C):   31.5 ml 16.14 ml/m RA Volume:   31.70 ml  16.24 ml/m LA Vol (A4C):   51.9 ml 26.59 ml/m LA Biplane Vol: 40.4 ml 20.70 ml/m  AORTIC VALVE AV Area (Vmax):    3.36 cm AV Area (Vmean):   3.20 cm AV Area (VTI):     3.06 cm AV Vmax:            101.00 cm/s AV Vmean:          70.200 cm/s AV VTI:            0.222 m AV Peak Grad:      4.1 mmHg AV Mean Grad:      2.0 mmHg LVOT Vmax:         108.00 cm/s LVOT Vmean:        71.600 cm/s LVOT VTI:          0.216 m LVOT/AV VTI ratio: 0.97  AORTA Ao Root diam: 2.50 cm MITRAL VALVE MV Area (PHT): 4.17 cm     SHUNTS MV Area VTI:   1.71 cm     Systemic VTI:  0.22 m MV Peak grad:  6.5 mmHg     Systemic Diam: 2.00 cm MV Mean grad:  2.0 mmHg MV Vmax:       1.27 m/s MV Vmean:      65.6 cm/s MV Decel Time: 182 msec MV E velocity: 84.20 cm/s MV A velocity: 129.00 cm/s MV E/A ratio:  0.65 Dina Rich MD Electronically signed by Dina Rich MD Signature Date/Time: 08/25/2021/3:39:37 PM    Final    CT Angio Abd/Pel w/ and/or w/o  Result Date: 08/23/2021 CLINICAL DATA:  80 year old female with unintended weight loss, nausea, vomiting. EXAM: CT ANGIOGRAPHY ABDOMEN AND PELVIS CT CHEST WITH CONTAST TECHNIQUE: Non-contrast CT of the chest was initially obtained. Multidetector CT imaging through the chest,  abdomen and pelvis was performed using the standard protocol during bolus administration of intravenous contrast. Multiplanar reconstructed images and MIPs were obtained and reviewed to evaluate the vascular anatomy. CONTRAST:  OMNIPAQUE IOHEXOL 350 MG/ML SOLN COMPARISON:  None available. FINDINGS: CT CHEST FINDINGS Cardiovascular: Trace pericardial effusion, measuring up to 8 mm in thickness just anterior to the right ventricle. Status post endovascular aortic valvular repair without complicating features. Scattered atherosclerotic calcifications of the normal caliber thoracic aorta. Severe coronary atherosclerotic calcifications. Coarse mitral annular calcifications are noted. The heart is normal in size. No evidence of central pulmonary embolism. Mediastinum/Nodes: Prominent pretracheal lymph node measuring up to 8 mm in short axis. No additional enlarged mediastinal, hilar, or axillary lymph nodes. Thyroid  gland, trachea, and esophagus demonstrate no significant findings. Lungs/Pleura: No focal consolidations. There are few punctate calcified granulomas in the peripheral right upper lobe. No suspicious pulmonary nodules. No pleural effusion or pneumothorax. Musculoskeletal: Mild multilevel degenerative changes of the thoracic spine. No acute osseous abnormality. No aggressive appearing osseous lesion. CTA ABDOMEN AND PELVIS FINDINGS VASCULAR Aorta: Normal caliber and patent throughout. Scattered circumferential atherosclerotic calcifications. Celiac: Mild ostial stenosis secondary to atherosclerotic plaque. Otherwise widely patent. SMA: Mild ostial stenosis secondary to atherosclerotic plaque. Otherwise widely patent. Renals: Single bilateral renal arteries are patent without evidence of aneurysm, dissection, vasculitis, fibromuscular dysplasia or significant stenosis. IMA: Patent without evidence of aneurysm, dissection, vasculitis or significant stenosis. Inflow: Patent without evidence of aneurysm, dissection, vasculitis or significant stenosis. Veins: The hepatic veins are widely patent. Portal system is widely patent and normal in caliber. The renal veins are normal in anatomic configuration and appear patent. No evidence of iliocaval thrombosis or anomaly. Review of the MIP images confirms the above findings. NON-VASCULAR Hepatobiliary: No focal liver abnormality is seen. Status post cholecystectomy. No biliary dilatation. Pancreas: Unremarkable. No pancreatic ductal dilatation or surrounding inflammatory changes. Spleen: Normal in size without focal abnormality. Adrenals/Urinary Tract: Hypoattenuating left adrenal mass measuring up to 15 mm. The right adrenal gland is normal in size morphology. Kidneys are normal in size enhancement bilaterally. No hydronephrosis, nephrolithiasis, or focal lesions. The bilateral ureters are decompressed. The bladder is relatively decompressed and unremarkable. Stomach/Bowel:  Stomach is within normal limits. Appendix is not definitively identified. There are few scattered descending and sigmoid colonic diverticula without surrounding inflammatory changes. No evidence of bowel wall thickening, distention, or inflammatory changes. Lymphatic: No abdominopelvic lymphadenopathy. Reproductive: Status post hysterectomy. No adnexal masses. Other: No abdominal wall hernia or abnormality. No abdominopelvic ascites. Musculoskeletal: Mild multilevel degenerative changes of the lumbar spine. No acute osseous abnormality. No aggressive appearing osseous lesion. IMPRESSION: VASCULAR 1. No evidence of acute or chronic occlusive mesenteric ischemia. 2. Trace pericardial effusion. 3. 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 4. Coronary and aortic atherosclerosis (ICD10-I70.0). NON VASCULAR 1. No acute abnormality in the chest, abdomen, or pelvis. 2. 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 Consider adrenal mass protocol CT follow-up in 2 years. ([employer], [employer], Boland GL, Francis IR, [place] GM, Scalp Level PJ, [employer], Pandhar[place]ande [place]. Management of Incidental Adrenal Masses: A White Paper of the ACR Incidental Findings Committee. J Am Coll Radiol. 2017 Aug;14(8):1038-1044. doi: 10.1016/j.jacr.2017.05.001. Epub 2017 Jun 23. PMID: 16109604.) 3. Evidence of prior granulomatous disease. Marliss Coots, MD [facility] Electronically Signed   By: Marliss Coots M.D.   On: 08/23/2021 16:46    Catarina Hartshorn, DO  [facility]  If 7PM-7AM, please contact night-coverage  [email or web address] Password [email or web address] 08/25/2021, 5:06 PM   LOS: 3 days

## 2021-08-26 ENCOUNTER — Ambulatory Visit: Payer: Self-pay | Admitting: *Deleted

## 2021-08-26 ENCOUNTER — Ambulatory Visit: Payer: Medicare Other | Admitting: Family Medicine

## 2021-08-26 LAB — BASIC METABOLIC PANEL
Anion gap: 6 (ref 5–15)
BUN: 11 mg/dL (ref 8–23)
CO2: 26 mmol/L (ref 22–32)
Calcium: 7.4 mg/dL — ABNORMAL LOW (ref 8.9–10.3)
Chloride: 109 mmol/L (ref 98–111)
Creatinine, Ser: 0.62 mg/dL (ref 0.44–1.00)
GFR, Estimated: >60 mL/min (ref >60)
Glucose, Bld: 104 mg/dL — ABNORMAL HIGH (ref 70–99)
Potassium: 3.6 mmol/L (ref 3.5–5.1)
Sodium: 141 mmol/L (ref 135–145)

## 2021-08-26 LAB — CBC
HCT: 31.5 % — ABNORMAL LOW (ref 36.0–46.0)
Hemoglobin: 10.3 g/dL — ABNORMAL LOW (ref 12.0–15.0)
MCH: 29.4 pg (ref 26.0–34.0)
MCHC: 32.7 g/dL (ref 30.0–36.0)
MCV: 90 fL (ref 80.0–100.0)
Platelets: 129 K/uL — ABNORMAL LOW (ref 150–400)
RBC: 3.5 MIL/uL — ABNORMAL LOW (ref 3.87–5.11)
RDW: 15.4 % (ref 11.5–15.5)
WBC: 6.2 K/uL (ref 4.0–10.5)
nRBC: 0 % (ref 0.0–0.2)

## 2021-08-26 LAB — MAGNESIUM: Magnesium: 1.3 mg/dL — ABNORMAL LOW (ref 1.7–2.4)

## 2021-08-26 LAB — GLUCOSE, CAPILLARY
Glucose-Capillary: 113 mg/dL — ABNORMAL HIGH (ref 70–99)
Glucose-Capillary: 116 mg/dL — ABNORMAL HIGH (ref 70–99)

## 2021-08-26 LAB — PROTIME-INR
INR: 3.2 — ABNORMAL HIGH (ref 0.8–1.2)
Prothrombin Time: 32.5 s — ABNORMAL HIGH (ref 11.4–15.2)

## 2021-08-26 MED ORDER — MAGNESIUM CHLORIDE 64 MG PO TBEC
1.0000 | DELAYED_RELEASE_TABLET | Freq: Two times a day (BID) | ORAL | Status: DC
  Filled 2021-08-26 (×7): qty 1

## 2021-08-26 MED ORDER — MAGNESIUM SULFATE 2 GM/50ML IV SOLN
2.0000 g | Freq: Once | INTRAVENOUS | Status: AC
  Administered 2021-08-26: 2 g via INTRAVENOUS
  Filled 2021-08-26: qty 50

## 2021-08-26 MED ORDER — APIXABAN 5 MG PO TABS: 5.0000 mg | ORAL_TABLET | Freq: Two times a day (BID) | ORAL | Status: DC

## 2021-08-26 MED ORDER — PANTOPRAZOLE SODIUM 40 MG PO TBEC: 40.0000 mg | DELAYED_RELEASE_TABLET | Freq: Two times a day (BID) | ORAL | Status: DC

## 2021-08-26 MED ORDER — APIXABAN 5 MG PO TABS: 5.0000 mg | ORAL_TABLET | Freq: Two times a day (BID) | ORAL | 1 refills | Status: AC

## 2021-08-26 NOTE — TOC Transition Note (Signed)
Transition of Care [facility]) - CM/SW Discharge Note   Patient Details  Name: Kathleen Barnett MRN: [id number] Date of Birth: Jan 03, 1941  [facility] CM/SW Contact:  Barry Brunner, LCSW Phone Number: 08/26/2021, 1:20 PM   Clinical Narrative:    CSW notified of patient's readiness for discharge. CSW referred patient to Denyse Amass with [facility]. Denyse Amass agreeable to take patient. TOC signing off.   Final next level of care: Home w Home Health Services Barriers to Discharge: Barriers Resolved   Patient Goals and CMS Choice Patient states their goals for this hospitalization and ongoing recovery are:: Return home with [facility] CMS Medicare.gov Compare Post Acute Care list provided to:: Patient Choice offered to / list presented to : Patient  Discharge Placement                    Patient and family notified of of transfer: 08/26/21  Discharge Plan and Services                          HH Arranged: PT [facility] Agency: [facility]        Social Determinants of Health (SDOH) Interventions     Readmission Risk Interventions No flowsheet data found.

## 2021-08-26 NOTE — Progress Notes (Signed)
Nsg Discharge Note  Admit Date:  08/21/2021 Discharge date: 08/26/2021   TAKYA VANDIVIER to be D/C'd Home per MD order.  AVS completed.  Copy for chart, and copy for patient signed, and dated. Patient/caregiver able to verbalize understanding. Removed IV-CDI. Reviewed d/c paperwork with patient and daughter. Answered all questions. Wheeled stable patient and belongings to main entrance where she was picked up by her daughter. Discharge Medication: Allergies as of 08/26/2021   No Known Allergies      Medication List     STOP taking these medications    warfarin 5 MG tablet Commonly known as: COUMADIN       TAKE these medications    albuterol 108 (90 Base) MCG/ACT inhaler Commonly known as: VENTOLIN HFA Inhale 2 puffs into the lungs every 6 (six) hours as needed for wheezing or shortness of breath.   apixaban 5 MG Tabs tablet Commonly known as: ELIQUIS Take 1 tablet (5 mg total) by mouth 2 (two) times daily. Start 08/31/21 Start taking on: August 31, 2021   atorvastatin 20 MG tablet Commonly known as: LIPITOR Take 20 mg by mouth every evening.   docusate sodium 100 MG capsule Commonly known as: Colace Take 1 capsule (100 mg total) by mouth 2 (two) times daily.   donepezil 10 MG tablet Commonly known as: ARICEPT Take 10 mg by mouth daily.   ferrous sulfate 325 (65 FE) MG tablet Take 1 tablet (325 mg total) by mouth daily with breakfast.   isosorbide mononitrate 30 MG 24 hr tablet Commonly known as: IMDUR Take 30 mg by mouth daily.   memantine 10 MG tablet Commonly known as: NAMENDA Take 10 mg by mouth 2 (two) times daily.   metFORMIN 500 MG tablet Commonly known as: GLUCOPHAGE Take 500 mg by mouth 2 (two) times daily with a meal.   omeprazole 40 MG capsule Commonly known as: PRILOSEC Take 1 capsule (40 mg total) by mouth in the morning and at bedtime.   QUEtiapine 25 MG tablet Commonly known as: SEROQUEL Take 1 tablet (25 mg total) by mouth at bedtime.    traMADol 50 MG tablet Commonly known as: ULTRAM Take 50 mg by mouth daily as needed (pain).   traZODone 150 MG tablet Commonly known as: DESYREL Take 1 tablet (150 mg total) by mouth at bedtime.        Discharge Assessment: Vitals:   08/26/21 0431 08/26/21 1112  BP: 120/72 105/60  Pulse: 98 93  Resp: 20 17  Temp: 98.4 F (36.9 C) 98.5 F (36.9 C)  SpO2: 97% 98%   Skin clean, dry and intact without evidence of skin break down, no evidence of skin tears noted. IV catheter discontinued intact. Site without signs and symptoms of complications - no redness or edema noted at insertion site, patient denies c/o pain - only slight tenderness at site.  Dressing with slight pressure applied.  D/c Instructions-Education: Discharge instructions given to patient/family with verbalized understanding. D/c education completed with patient/family including follow up instructions, medication list, d/c activities limitations if indicated, with other d/c instructions as indicated by MD - patient able to verbalize understanding, all questions fully answered. Patient instructed to return to ED, call 911, or call MD for any changes in condition.  Patient escorted via WC, and D/C home via private auto.  Karolee Ohs, RN 08/26/2021 3:01 PM

## 2021-08-26 NOTE — Discharge Summary (Signed)
Physician Discharge Summary  Kathleen Barnett EGB:[id number] DOB: 1941/06/15 DOA: 08/21/2021  PCP: Marylynn Pearson, FNP  Admit date: 08/21/2021 Discharge date: 08/26/2021  Admitted From: Home Disposition:  Home   Recommendations for Outpatient Follow-up:  Follow up with PCP in 1-2 weeks Please obtain BMP/CBC in one week   Home Health:HHPT   Discharge Condition: Stable CODE STATUS:FULL Diet recommendation: Heart Healthy / Carb Modified    Brief/Interim Summary: As per H&P written by Dr. Mariea Clonts on 08/21/2021 Kathleen Barnett is a 80 y.o. female with medical history significant for atrial fibrillation on chronic anticoagulation with Coumadin, diabetes mellitus. Was brought to the ED ports of generalized weakness, vomiting, nausea and weight loss.  Daughter also reported patient's INR has been elevated.  The time of my evaluation daughter has left, patient is not exactly sure sure why she was brought to the hospital.  She denies abdominal pain.  Reports generalized weakness.   Patient was admitted 7/19-7/23 for supratherapeutic INR of greater than 10, with melena, symptomatic anemia.  Patient required FFP x1 unit, was given vitamin K.  EGD done showed several gastric ulcers.    ED Course: Stable vitals, blood pressure 105 242.  Globin stable 14.  INR greater than 10.  Potassium 2.6. 1.4.  Stool FOBT positive.  2.5 mg vitamin K given.  Hospitalist to admit.  Discharge Diagnoses:  supratherapeutic INR with melena and -With INR more than 10 on presentation -similar presentation 07/05/21-07/09/21 with INR >10 -Hemoglobin has remained stable -After FFP and vitamin K INR 2.8 -Continued to follow hemoglobin trend -Gastroenterology service has been consulted and planning conservative management for now no endoscopic intervention planned.  -advance to full liquid>>dys 1 diet -continue PPI bid   peptic ulcer disease -Continue avoiding NSAIDs. -Maintain INR therapeutic and control. -Continue to  follow hemoglobin trend; no transfusion needed. -Continue PPI bid   hypokalemia, hypomagnesemia and hyponatremia -Continue electrolytes repletion. -Follow electrolytes trend.   weakness, vomiting and weight loss/FTT -Patient feeling better after fluid resuscitation. -Continue to provide repletion. -UA--no pyuria -check B12--547 -check folate--9.5 -TSH--3.077 -9/6 CT chest--punctate calcified granulomas in the peripheral right upper lobe; s/p endovascular aortic valvular repair without complicating features -9/6 CT abd/pelvis--no acute findings -PT eval>>HHPT   controlled type 2 diabetes mellitus -Continue holding oral hypoglycemic agent>>restart after d/c -Follow CBGs.   dementia with behavioral disturbance and high risk for hospital-acquired delirium. -Resume Aricept, Namenda and Seroquel. 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 -improved--serum creatinine 0.62 on day of d/c   aortic valve replacement--bioprosthetic -previously on warfarin -does not appear to be mechanical on exam -consult cardiology--appreciated -Echo 60-65%, no WMA, bioprosthetic AVR -d/c coumadin -plan to start NOAC when INR subtherapeutic give recurrent admissions for INR >10 -instructions given to NOAC on 08/31/21 as INR was still 3.2 on day of d/c  Atrial fibrillation, type unspecified -start DOAC on 08/31/21 -stop warfarin -currently in sinus     Discharge Instructions   Allergies as of 08/26/2021   No Known Allergies      Medication List     STOP taking these medications    warfarin 5 MG tablet Commonly  known as: COUMADIN       TAKE  these medications    albuterol 108 (90 Base) MCG/ACT inhaler Commonly known as: VENTOLIN HFA Inhale 2 puffs into the lungs every 6 (six) hours as needed for wheezing or shortness of breath.   apixaban 5 MG Tabs tablet Commonly known as: ELIQUIS Take 1 tablet (5 mg total) by mouth 2 (two) times daily. Start 08/31/21 Start taking on: August 31, 2021   atorvastatin 20 MG tablet Commonly known as: LIPITOR Take 20 mg by mouth every evening.   docusate sodium 100 MG capsule Commonly known as: Colace Take 1 capsule (100 mg total) by mouth 2 (two) times daily.   donepezil 10 MG tablet Commonly known as: ARICEPT Take 10 mg by mouth daily.   ferrous sulfate 325 (65 FE) MG tablet Take 1 tablet (325 mg total) by mouth daily with breakfast.   isosorbide mononitrate 30 MG 24 hr tablet Commonly known as: IMDUR Take 30 mg by mouth daily.   memantine 10 MG tablet Commonly known as: NAMENDA Take 10 mg by mouth 2 (two) times daily.   metFORMIN 500 MG tablet Commonly known as: GLUCOPHAGE Take 500 mg by mouth 2 (two) times daily with a meal.   omeprazole 40 MG capsule Commonly known as: PRILOSEC Take 1 capsule (40 mg total) by mouth in the morning and at bedtime.   QUEtiapine 25 MG tablet Commonly known as: SEROQUEL Take 1 tablet (25 mg total) by mouth at bedtime.   traMADol 50 MG tablet Commonly known as: ULTRAM Take 50 mg by mouth daily as needed (pain).   traZODone 150 MG tablet Commonly known as: DESYREL Take 1 tablet (150 mg total) by mouth at bedtime.        No Known Allergies  Consultations: cardiology   Procedures/Studies: CT CHEST W CONTRAST  Result Date: 08/23/2021 CLINICAL DATA:  80 year old female with unintended weight loss, nausea, vomiting. EXAM: CT ANGIOGRAPHY ABDOMEN AND PELVIS CT CHEST WITH CONTAST TECHNIQUE: Non-contrast CT of the chest was initially obtained. Multidetector CT imaging through the chest, abdomen and  pelvis was performed using the standard protocol during bolus administration of intravenous contrast. Multiplanar reconstructed images and MIPs were obtained and reviewed to evaluate the vascular anatomy. CONTRAST:  100mL OMNIPAQUE IOHEXOL 350 MG/ML SOLN COMPARISON:  None available. FINDINGS: CT CHEST FINDINGS Cardiovascular: Trace pericardial effusion, measuring up to 8 mm in thickness just anterior to the right ventricle. Status post endovascular aortic valvular repair without complicating features. Scattered atherosclerotic calcifications of the normal caliber thoracic aorta. Severe coronary atherosclerotic calcifications. Coarse mitral annular calcifications are noted. The heart is normal in size. No evidence of central pulmonary embolism. Mediastinum/Nodes: Prominent pretracheal lymph node measuring up to 8 mm in short axis. No additional enlarged mediastinal, hilar, or axillary lymph nodes. Thyroid gland, trachea, and esophagus demonstrate no significant findings. Lungs/Pleura: No focal consolidations. There are few punctate calcified granulomas in the peripheral right upper lobe. No suspicious pulmonary nodules. No pleural effusion or pneumothorax. Musculoskeletal: Mild multilevel degenerative changes of the thoracic spine. No acute osseous abnormality. No aggressive appearing osseous lesion. CTA ABDOMEN AND PELVIS FINDINGS VASCULAR Aorta: Normal caliber and patent throughout. Scattered circumferential atherosclerotic calcifications. Celiac: Mild ostial stenosis secondary to atherosclerotic plaque. Otherwise widely patent. SMA: Mild ostial stenosis secondary to atherosclerotic plaque. Otherwise widely patent. Renals: Single bilateral renal arteries are patent without evidence of aneurysm, dissection, vasculitis, fibromuscular dysplasia or significant stenosis. IMA: Patent without evidence of aneurysm, dissection, vasculitis or significant stenosis. Inflow: Patent without evidence of aneurysm, dissection,  vasculitis or significant stenosis.  Veins: The hepatic veins are widely patent. Portal system is widely patent and normal in caliber. The renal veins are normal in anatomic configuration and appear patent. No evidence of iliocaval thrombosis or anomaly. Review of the MIP images confirms the above findings. NON-VASCULAR Hepatobiliary: No focal liver abnormality is seen. Status post cholecystectomy. No biliary dilatation. Pancreas: Unremarkable. No pancreatic ductal dilatation or surrounding inflammatory changes. Spleen: Normal in size without focal abnormality. Adrenals/Urinary Tract: Hypoattenuating left adrenal mass measuring up to 15 mm. The right adrenal gland is normal in size morphology. Kidneys are normal in size enhancement bilaterally. No hydronephrosis, nephrolithiasis, or focal lesions. The bilateral ureters are decompressed. The bladder is relatively decompressed and unremarkable. Stomach/Bowel: Stomach is within normal limits. Appendix is not definitively identified. There are few scattered descending and sigmoid colonic diverticula without surrounding inflammatory changes. No evidence of bowel wall thickening, distention, or inflammatory changes. Lymphatic: No abdominopelvic lymphadenopathy. Reproductive: Status post hysterectomy. No adnexal masses. Other: No abdominal wall hernia or abnormality. No abdominopelvic ascites. Musculoskeletal: Mild multilevel degenerative changes of the lumbar spine. No acute osseous abnormality. No aggressive appearing osseous lesion. IMPRESSION: VASCULAR 1. No evidence of acute or chronic occlusive mesenteric ischemia. 2. Trace pericardial effusion. 3. 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 4. Coronary and aortic atherosclerosis (ICD10-I70.0). NON VASCULAR 1. No acute abnormality in the chest, abdomen, or pelvis. 2. 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 Consider adrenal mass protocol CT follow-up in 2 years. ([employer],  [employer], Bol[employer] GL, Francis IR, [place] GM, [place] PJ, [employer], Pandharipande [place]. Management of Incidental Adrenal Masses: A White Paper of the ACR Incidental Findings Committee. J Am Coll Radiol. 2017 Aug;14(8):1038-1044. doi: 10.1016/j.jacr.2017.05.001. Epub 2017 Jun 23. PMID: 94765465.) 3. Evidence of prior granulomatous disease. Marliss Coots, MD [facility] Radiology Electronically Signed   By: Marliss Coots M.D.   On: 08/23/2021 16:46   ECHOCARDIOGRAM COMPLETE  Result Date: 08/25/2021    ECHOCARDIOGRAM REPORT   Patient Name:   Kathleen Barnett Date of Exam: 08/25/2021 Medical Rec #:  [id number]      Height:       67.0 in Accession #:    [id number]     Weight:       184.0 lb Date of Birth:  12/01/1941       BSA:          1.952 m Patient Age:    80 years       BP:           144/94 mmHg Patient Gender: F              HR:           95 bpm. [facility] Procedure: 2D Echo, Cardiac Doppler and Color Doppler Indications:    Aortic valve disorder  History:        Patient has no prior history of Echocardiogram examinations.                 Arrythmias:Atrial Fibrillation.  Sonographer:    Mikki Harbor Referring Phys: [phone] Shallyn Barnett IMPRESSIONS  1. Left ventricular ejection fraction, by estimation, is 60 to 65%. The left ventricle has normal function. The left ventricle has no regional wall motion abnormalities. Left ventricular diastolic parameters are indeterminate.  2. Right ventricular systolic function is normal. The right ventricular size is normal. Tricuspid regurgitation signal is inadequate for assessing PA pressure.  3. The mitral  valve is normal in structure. No evidence of mitral valve regurgitation. No evidence of mitral stenosis.  4. 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 . The aortic valve has been repaired/replaced. Aortic valve regurgitation is not visualized. No aortic stenosis is present.  5. The  inferior vena cava is normal in size with greater than 50% respiratory variability, suggesting right atrial pressure of 3 mmHg. FINDINGS  Left Ventricle: Left ventricular ejection fraction, by estimation, is 60 to 65%. The left ventricle has normal function. The left ventricle has no regional wall motion abnormalities. The left ventricular internal cavity size was normal in size. There is  no left ventricular hypertrophy. Left ventricular diastolic parameters are indeterminate. Right Ventricle: The right ventricular size is normal. Right vetricular wall thickness was not well visualized. Right ventricular systolic function is normal. Tricuspid regurgitation signal is inadequate for assessing PA pressure. Left Atrium: Left atrial size was normal in size. Right Atrium: Right atrial size was normal in size. Pericardium: There is no evidence of pericardial effusion. Mitral Valve: The mitral valve is normal in structure. No evidence of mitral valve regurgitation. No evidence of mitral valve stenosis. MV peak gradient, 6.5 mmHg. The mean mitral valve gradient is 2.0 mmHg. Tricuspid Valve: The tricuspid valve is normal in structure. Tricuspid valve regurgitation is not demonstrated. No evidence of tricuspid stenosis. 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 The aortic valve has been repaired/replaced. Aortic valve regurgitation is not visualized. No aortic stenosis is present. Aortic valve mean gradient measures 2.0 mmHg.  Aortic valve peak gradient measures 4.1 mmHg. Aortic valve area, by VTI measures 3.06 cm. Pulmonic Valve: The pulmonic valve was not well visualized. Pulmonic valve regurgitation is not visualized. No evidence of pulmonic stenosis. Aorta: The aortic root is normal in size and structure. Venous: The inferior vena cava is normal in size with greater than 50% respiratory variability, suggesting right atrial pressure of 3 mmHg. IAS/Shunts: No atrial level shunt  detected by color flow Doppler.  LEFT VENTRICLE PLAX 2D LVIDd:         3.65 cm  Diastology LVIDs:         2.44 cm  LV e' medial:    14.70 cm/s LV PW:         1.00 cm  LV E/e' medial:  5.7 LV IVS:        0.90 cm  LV e' lateral:   8.15 cm/s LVOT diam:     2.00 cm  LV E/e' lateral: 10.3 LV SV:         68 LV SV Index:   35 LVOT Area:     3.14 cm  RIGHT VENTRICLE RV Basal diam:  2.83 cm RV Mid diam:    2.41 cm RV S prime:     12.60 cm/s TAPSE (M-mode): 2.4 cm LEFT ATRIUM             Index       RIGHT ATRIUM           Index LA diam:        4.10 cm 2.10 cm/m  RA Area:     13.80 cm LA Vol (A2C):   31.5 ml 16.14 ml/m RA Volume:   31.70 ml  16.24 ml/m LA Vol (A4C):   51.9 ml 26.59 ml/m LA Biplane Vol: 40.4 ml 20.70 ml/m  AORTIC VALVE AV Area (Vmax):    3.36 cm AV Area (Vmean):  3.20 cm AV Area (VTI):     3.06 cm AV Vmax:           101.00 cm/s AV Vmean:          70.200 cm/s AV VTI:            0.222 m AV Peak Grad:      4.1 mmHg AV Mean Grad:      2.0 mmHg LVOT Vmax:         108.00 cm/s LVOT Vmean:        71.600 cm/s LVOT VTI:          0.216 m LVOT/AV VTI ratio: 0.97  AORTA Ao Root diam: 2.50 cm MITRAL VALVE MV Area (PHT): 4.17 cm     SHUNTS MV Area VTI:   1.71 cm     Systemic VTI:  0.22 m MV Peak grad:  6.5 mmHg     Systemic Diam: 2.00 cm MV Mean grad:  2.0 mmHg MV Vmax:       1.27 m/s MV Vmean:      65.6 cm/s MV Decel Time: 182 msec MV E velocity: 84.20 cm/s MV A velocity: 129.00 cm/s MV E/A ratio:  0.65 Dina Rich MD Electronically signed by Dina Rich MD Signature Date/Time: 08/25/2021/3:39:37 PM    Final    CT Angio Abd/Pel w/ and/or w/o  Result Date: 08/23/2021 CLINICAL DATA:  80 year old female with unintended weight loss, nausea, vomiting. EXAM: CT ANGIOGRAPHY ABDOMEN AND PELVIS CT CHEST WITH CONTAST TECHNIQUE: Non-contrast CT of the chest was initially obtained. Multidetector CT imaging through the chest, abdomen and pelvis was performed using the standard protocol during bolus administration of  intravenous contrast. Multiplanar reconstructed images and MIPs were obtained and reviewed to evaluate the vascular anatomy. CONTRAST:  OMNIPAQUE IOHEXOL 350 MG/ML SOLN COMPARISON:  None available. FINDINGS: CT CHEST FINDINGS Cardiovascular: Trace pericardial effusion, measuring up to 8 mm in thickness just anterior to the right ventricle. Status post endovascular aortic valvular repair without complicating features. Scattered atherosclerotic calcifications of the normal caliber thoracic aorta. Severe coronary atherosclerotic calcifications. Coarse mitral annular calcifications are noted. The heart is normal in size. No evidence of central pulmonary embolism. Mediastinum/Nodes: Prominent pretracheal lymph node measuring up to 8 mm in short axis. No additional enlarged mediastinal, hilar, or axillary lymph nodes. Thyroid gland, trachea, and esophagus demonstrate no significant findings. Lungs/Pleura: No focal consolidations. There are few punctate calcified granulomas in the peripheral right upper lobe. No suspicious pulmonary nodules. No pleural effusion or pneumothorax. Musculoskeletal: Mild multilevel degenerative changes of the thoracic spine. No acute osseous abnormality. No aggressive appearing osseous lesion. CTA ABDOMEN AND PELVIS FINDINGS VASCULAR Aorta: Normal caliber and patent throughout. Scattered circumferential atherosclerotic calcifications. Celiac: Mild ostial stenosis secondary to atherosclerotic plaque. Otherwise widely patent. SMA: Mild ostial stenosis secondary to atherosclerotic plaque. Otherwise widely patent. Renals: Single bilateral renal arteries are patent without evidence of aneurysm, dissection, vasculitis, fibromuscular dysplasia or significant stenosis. IMA: Patent without evidence of aneurysm, dissection, vasculitis or significant stenosis. Inflow: Patent without evidence of aneurysm, dissection, vasculitis or significant stenosis. Veins: The hepatic veins are widely patent.  Portal system is widely patent and normal in caliber. The renal veins are normal in anatomic configuration and appear patent. No evidence of iliocaval thrombosis or anomaly. Review of the MIP images confirms the above findings. NON-VASCULAR Hepatobiliary: No focal liver abnormality is seen. Status post cholecystectomy. No biliary dilatation. Pancreas: Unremarkable. No pancreatic ductal dilatation or surrounding inflammatory changes. Spleen: Normal in size  without focal abnormality. Adrenals/Urinary Tract: Hypoattenuating left adrenal mass measuring up to 15 mm. The right adrenal gland is normal in size morphology. Kidneys are normal in size enhancement bilaterally. No hydronephrosis, nephrolithiasis, or focal lesions. The bilateral ureters are decompressed. The bladder is relatively decompressed and unremarkable. Stomach/Bowel: Stomach is within normal limits. Appendix is not definitively identified. There are few scattered descending and sigmoid colonic diverticula without surrounding inflammatory changes. No evidence of bowel wall thickening, distention, or inflammatory changes. Lymphatic: No abdominopelvic lymphadenopathy. Reproductive: Status post hysterectomy. No adnexal masses. Other: No abdominal wall hernia or abnormality. No abdominopelvic ascites. Musculoskeletal: Mild multilevel degenerative changes of the lumbar spine. No acute osseous abnormality. No aggressive appearing osseous lesion. IMPRESSION: VASCULAR 1. No evidence of acute or chronic occlusive mesenteric ischemia. 2. Trace pericardial effusion. 3. 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 4. Coronary and aortic atherosclerosis (ICD10-I70.0). NON VASCULAR 1. No acute abnormality in the chest, abdomen, or pelvis. 2. 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 Consider adrenal mass protocol CT follow-up in 2 years. ([employer], [employer], [employer] GL, Francis IR, [place] GM, [place] [employer], [place] [employer], Pandharipande  [place]. Management of Incidental Adrenal Masses: A White Paper of the ACR Incidental Findings Committee. J Am Coll Radiol. 2017 Aug;14(8):1038-1044. doi: 10.1016/j.jacr.2017.05.001. Epub 2017 Jun 23. PMID: [id number].) 3. Evidence of prior granulomatous disease. Marliss Coots, MD [facility] Radiology Electronically Signed   By: Marliss Coots M.D.   On: 08/23/2021 16:46        Discharge Exam: Vitals:   08/26/21 0431 08/26/21 1112  BP: 120/72 105/60  Pulse: 98 93  Resp: 20 17  Temp: 98.4 F (36.9 C) 98.5 F (36.9 C)  SpO2: 97% 98%   Vitals:   08/25/21 0900 08/25/21 1400 08/26/21 0431 08/26/21 1112  BP: 105/83 110/76 120/72 105/60  Pulse: 80 87 98 93  Resp:  18 20 17   Temp:  98.6 F (37 C) 98.4 F (36.9 C) 98.5 F (36.9 C)  TempSrc:  Oral  Oral  SpO2:  98% 97% 98%  Weight:      Height:        General: Pt is alert, awake, not in acute distress Cardiovascular: RRR, S1/S2 +, no rubs, no gallops Respiratory: CTA bilaterally, no wheezing, no rhonchi Abdominal: Soft, NT, ND, bowel sounds + Extremities: no edema, no cyanosis   The results of significant diagnostics from this hospitalization (including imaging, microbiology, ancillary and laboratory) are listed below for reference.    Significant Diagnostic Studies: CT CHEST W CONTRAST  Result Date: 08/23/2021 CLINICAL DATA:  80 year old female with unintended weight loss, nausea, vomiting. EXAM: CT ANGIOGRAPHY ABDOMEN AND PELVIS CT CHEST WITH CONTAST TECHNIQUE: Non-contrast CT of the chest was initially obtained. Multidetector CT imaging through the chest, abdomen and pelvis was performed using the standard protocol during bolus administration of intravenous contrast. Multiplanar reconstructed images and MIPs were obtained and reviewed to evaluate the vascular anatomy. CONTRAST:  OMNIPAQUE IOHEXOL 350 MG/ML SOLN COMPARISON:  None available. FINDINGS: CT CHEST FINDINGS Cardiovascular:  Trace pericardial effusion, measuring up to 8 mm in thickness just anterior to the right ventricle. Status post endovascular aortic valvular repair without complicating features. Scattered atherosclerotic calcifications of the normal caliber thoracic aorta. Severe coronary atherosclerotic calcifications. Coarse mitral annular calcifications are noted. The heart is normal in size. No evidence of central pulmonary embolism. Mediastinum/Nodes: Prominent pretracheal lymph node measuring up to 8 mm in short axis. No additional enlarged  mediastinal, hilar, or axillary lymph nodes. Thyroid gland, trachea, and esophagus demonstrate no significant findings. Lungs/Pleura: No focal consolidations. There are few punctate calcified granulomas in the peripheral right upper lobe. No suspicious pulmonary nodules. No pleural effusion or pneumothorax. Musculoskeletal: Mild multilevel degenerative changes of the thoracic spine. No acute osseous abnormality. No aggressive appearing osseous lesion. CTA ABDOMEN AND PELVIS FINDINGS VASCULAR Aorta: Normal caliber and patent throughout. Scattered circumferential atherosclerotic calcifications. Celiac: Mild ostial stenosis secondary to atherosclerotic plaque. Otherwise widely patent. SMA: Mild ostial stenosis secondary to atherosclerotic plaque. Otherwise widely patent. Renals: Single bilateral renal arteries are patent without evidence of aneurysm, dissection, vasculitis, fibromuscular dysplasia or significant stenosis. IMA: Patent without evidence of aneurysm, dissection, vasculitis or significant stenosis. Inflow: Patent without evidence of aneurysm, dissection, vasculitis or significant stenosis. Veins: The hepatic veins are widely patent. Portal system is widely patent and normal in caliber. The renal veins are normal in anatomic configuration and appear patent. No evidence of iliocaval thrombosis or anomaly. Review of the MIP images confirms the above findings. NON-VASCULAR  Hepatobiliary: No focal liver abnormality is seen. Status post cholecystectomy. No biliary dilatation. Pancreas: Unremarkable. No pancreatic ductal dilatation or surrounding inflammatory changes. Spleen: Normal in size without focal abnormality. Adrenals/Urinary Tract: Hypoattenuating left adrenal mass measuring up to 15 mm. The right adrenal gland is normal in size morphology. Kidneys are normal in size enhancement bilaterally. No hydronephrosis, nephrolithiasis, or focal lesions. The bilateral ureters are decompressed. The bladder is relatively decompressed and unremarkable. Stomach/Bowel: Stomach is within normal limits. Appendix is not definitively identified. There are few scattered descending and sigmoid colonic diverticula without surrounding inflammatory changes. No evidence of bowel wall thickening, distention, or inflammatory changes. Lymphatic: No abdominopelvic lymphadenopathy. Reproductive: Status post hysterectomy. No adnexal masses. Other: No abdominal wall hernia or abnormality. No abdominopelvic ascites. Musculoskeletal: Mild multilevel degenerative changes of the lumbar spine. No acute osseous abnormality. No aggressive appearing osseous lesion. IMPRESSION: VASCULAR 1. No evidence of acute or chronic occlusive mesenteric ischemia. 2. Trace pericardial effusion. 3. 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 4. Coronary and aortic atherosclerosis (ICD10-I70.0). NON VASCULAR 1. No acute abnormality in the chest, abdomen, or pelvis. 2. 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 Consider adrenal mass protocol CT follow-up in 2 years. ([employer], [employer], Boland GL, Francis IR, [place] GM, [place] PJ, [employer], Pandharipande [place]. Management of Incidental Adrenal Masses: A White Paper of the ACR Incidental Findings Committee. J Am Coll Radiol. 2017 Aug;14(8):1038-1044. doi: 10.1016/j.jacr.2017.05.001. Epub 2017 Jun 23. PMID: 96045409.) 3. Evidence of prior  granulomatous disease. Marliss Coots, MD [facility] Electronically Signed   By: Marliss Coots M.D.   On: 08/23/2021 16:46   ECHOCARDIOGRAM COMPLETE  Result Date: 08/25/2021    ECHOCARDIOGRAM REPORT   Patient Name:   Kathleen Barnett Date of Exam: 08/25/2021 Medical Rec #:  [id number]      Height:       67.0 in Accession #:    [id number]     Weight:       184.0 lb Date of Birth:  Feb 19, 1941       BSA:          1.952 m Patient Age:    80 years       BP:           144/94 mmHg Patient Gender: F              HR:  95 bpm. [facility] Procedure: 2D Echo, Cardiac Doppler and Color Doppler Indications:    Aortic valve disorder  History:        Patient has no prior history of Echocardiogram examinations.                 Arrythmias:Atrial Fibrillation.  Sonographer:    Mikki Harbor Referring Phys: [phone] Cythina Mickelsen IMPRESSIONS  1. Left ventricular ejection fraction, by estimation, is 60 to 65%. The left ventricle has normal function. The left ventricle has no regional wall motion abnormalities. Left ventricular diastolic parameters are indeterminate.  2. Right ventricular systolic function is normal. The right ventricular size is normal. Tricuspid regurgitation signal is inadequate for assessing PA pressure.  3. The mitral valve is normal in structure. No evidence of mitral valve regurgitation. No evidence of mitral stenosis.  4. 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 . The aortic valve has been repaired/replaced. Aortic valve regurgitation is not visualized. No aortic stenosis is present.  5. The inferior vena cava is normal in size with greater than 50% respiratory variability, suggesting right atrial pressure of 3 mmHg. FINDINGS  Left Ventricle: Left ventricular ejection fraction, by estimation, is 60 to 65%. The left ventricle has normal function. The left ventricle has no regional wall motion abnormalities.  The left ventricular internal cavity size was normal in size. There is  no left ventricular hypertrophy. Left ventricular diastolic parameters are indeterminate. Right Ventricle: The right ventricular size is normal. Right vetricular wall thickness was not well visualized. Right ventricular systolic function is normal. Tricuspid regurgitation signal is inadequate for assessing PA pressure. Left Atrium: Left atrial size was normal in size. Right Atrium: Right atrial size was normal in size. Pericardium: There is no evidence of pericardial effusion. Mitral Valve: The mitral valve is normal in structure. No evidence of mitral valve regurgitation. No evidence of mitral valve stenosis. MV peak gradient, 6.5 mmHg. The mean mitral valve gradient is 2.0 mmHg. Tricuspid Valve: The tricuspid valve is normal in structure. Tricuspid valve regurgitation is not demonstrated. No evidence of tricuspid stenosis. 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 The aortic valve has been repaired/replaced. Aortic valve regurgitation is not visualized. No aortic stenosis is present. Aortic valve mean gradient measures 2.0 mmHg.  Aortic valve peak gradient measures 4.1 mmHg. Aortic valve area, by VTI measures 3.06 cm. Pulmonic Valve: The pulmonic valve was not well visualized. Pulmonic valve regurgitation is not visualized. No evidence of pulmonic stenosis. Aorta: The aortic root is normal in size and structure. Venous: The inferior vena cava is normal in size with greater than 50% respiratory variability, suggesting right atrial pressure of 3 mmHg. IAS/Shunts: No atrial level shunt detected by color flow Doppler.  LEFT VENTRICLE PLAX 2D LVIDd:         3.65 cm  Diastology LVIDs:         2.44 cm  LV e' medial:    14.70 cm/s LV PW:         1.00 cm  LV E/e' medial:  5.7 LV IVS:        0.90 cm  LV e' lateral:   8.15 cm/s LVOT diam:     2.00 cm  LV E/e' lateral: 10.3 LV SV:         68 LV SV Index:   35  LVOT Area:     3.14 cm  RIGHT VENTRICLE RV Basal  diam:  2.83 cm RV Mid diam:    2.41 cm RV S prime:     12.60 cm/s TAPSE (M-mode): 2.4 cm LEFT ATRIUM             Index       RIGHT ATRIUM           Index LA diam:        4.10 cm 2.10 cm/m  RA Area:     13.80 cm LA Vol (A2C):   31.5 ml 16.14 ml/m RA Volume:   31.70 ml  16.24 ml/m LA Vol (A4C):   51.9 ml 26.59 ml/m LA Biplane Vol: 40.4 ml 20.70 ml/m  AORTIC VALVE AV Area (Vmax):    3.36 cm AV Area (Vmean):   3.20 cm AV Area (VTI):     3.06 cm AV Vmax:           101.00 cm/s AV Vmean:          70.200 cm/s AV VTI:            0.222 m AV Peak Grad:      4.1 mmHg AV Mean Grad:      2.0 mmHg LVOT Vmax:         108.00 cm/s LVOT Vmean:        71.600 cm/s LVOT VTI:          0.216 m LVOT/AV VTI ratio: 0.97  AORTA Ao Root diam: 2.50 cm MITRAL VALVE MV Area (PHT): 4.17 cm     SHUNTS MV Area VTI:   1.71 cm     Systemic VTI:  0.22 m MV Peak grad:  6.5 mmHg     Systemic Diam: 2.00 cm MV Mean grad:  2.0 mmHg MV Vmax:       1.27 m/s MV Vmean:      65.6 cm/s MV Decel Time: 182 msec MV E velocity: 84.20 cm/s MV A velocity: 129.00 cm/s MV E/A ratio:  0.65 Dina Rich MD Electronically signed by Dina Rich MD Signature Date/Time: 08/25/2021/3:39:37 PM    Final    CT Angio Abd/Pel w/ and/or w/o  Result Date: 08/23/2021 CLINICAL DATA:  80 year old female with unintended weight loss, nausea, vomiting. EXAM: CT ANGIOGRAPHY ABDOMEN AND PELVIS CT CHEST WITH CONTAST TECHNIQUE: Non-contrast CT of the chest was initially obtained. Multidetector CT imaging through the chest, abdomen and pelvis was performed using the standard protocol during bolus administration of intravenous contrast. Multiplanar reconstructed images and MIPs were obtained and reviewed to evaluate the vascular anatomy. CONTRAST:  OMNIPAQUE IOHEXOL 350 MG/ML SOLN COMPARISON:  None available. FINDINGS: CT CHEST FINDINGS Cardiovascular: Trace pericardial effusion, measuring up to 8 mm in thickness just  anterior to the right ventricle. Status post endovascular aortic valvular repair without complicating features. Scattered atherosclerotic calcifications of the normal caliber thoracic aorta. Severe coronary atherosclerotic calcifications. Coarse mitral annular calcifications are noted. The heart is normal in size. No evidence of central pulmonary embolism. Mediastinum/Nodes: Prominent pretracheal lymph node measuring up to 8 mm in short axis. No additional enlarged mediastinal, hilar, or axillary lymph nodes. Thyroid gland, trachea, and esophagus demonstrate no significant findings. Lungs/Pleura: No focal consolidations. There are few punctate calcified granulomas in the peripheral right upper lobe. No suspicious pulmonary nodules. No pleural effusion or pneumothorax. Musculoskeletal: Mild multilevel degenerative changes of the thoracic spine. No acute osseous abnormality. No aggressive appearing osseous lesion. CTA ABDOMEN AND PELVIS FINDINGS VASCULAR Aorta: Normal caliber and patent throughout. Scattered circumferential atherosclerotic calcifications. Celiac: Mild ostial  stenosis secondary to atherosclerotic plaque. Otherwise widely patent. SMA: Mild ostial stenosis secondary to atherosclerotic plaque. Otherwise widely patent. Renals: Single bilateral renal arteries are patent without evidence of aneurysm, dissection, vasculitis, fibromuscular dysplasia or significant stenosis. IMA: Patent without evidence of aneurysm, dissection, vasculitis or significant stenosis. Inflow: Patent without evidence of aneurysm, dissection, vasculitis or significant stenosis. Veins: The hepatic veins are widely patent. Portal system is widely patent and normal in caliber. The renal veins are normal in anatomic configuration and appear patent. No evidence of iliocaval thrombosis or anomaly. Review of the MIP images confirms the above findings. NON-VASCULAR Hepatobiliary: No focal liver abnormality is seen. Status post  cholecystectomy. No biliary dilatation. Pancreas: Unremarkable. No pancreatic ductal dilatation or surrounding inflammatory changes. Spleen: Normal in size without focal abnormality. Adrenals/Urinary Tract: Hypoattenuating left adrenal mass measuring up to 15 mm. The right adrenal gland is normal in size morphology. Kidneys are normal in size enhancement bilaterally. No hydronephrosis, nephrolithiasis, or focal lesions. The bilateral ureters are decompressed. The bladder is relatively decompressed and unremarkable. Stomach/Bowel: Stomach is within normal limits. Appendix is not definitively identified. There are few scattered descending and sigmoid colonic diverticula without surrounding inflammatory changes. No evidence of bowel wall thickening, distention, or inflammatory changes. Lymphatic: No abdominopelvic lymphadenopathy. Reproductive: Status post hysterectomy. No adnexal masses. Other: No abdominal wall hernia or abnormality. No abdominopelvic ascites. Musculoskeletal: Mild multilevel degenerative changes of the lumbar spine. No acute osseous abnormality. No aggressive appearing osseous lesion. IMPRESSION: VASCULAR 1. No evidence of acute or chronic occlusive mesenteric ischemia. 2. Trace pericardial effusion. 3. 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 4. Coronary and aortic atherosclerosis (ICD10-I70.0). NON VASCULAR 1. No acute abnormality in the chest, abdomen, or pelvis. 2. 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 Consider adrenal mass protocol CT follow-up in 2 years. ([employer], [employer], Boland GL, Francis IR, [place] GM, [place] PJ, [employer], Pandharipande [place]. Management of Incidental Adrenal Masses: A White Paper of the ACR Incidental Findings Committee. J Am Coll Radiol. 2017 Aug;14(8):1038-1044. doi: 10.1016/j.jacr.2017.05.001. Epub 2017 Jun 23. PMID: 16109604.) 3. Evidence of prior granulomatous disease. Marliss Coots, MD [facility] Radiology Electronically Signed   By: Marliss Coots M.D.   On: 08/23/2021 16:46    Microbiology: Recent Results (from the past 240 hour(s))  Resp Panel by RT-PCR (Flu A&B, Covid) Nasopharyngeal Swab     Status: None   Collection Time: 08/21/21  7:12 PM   Specimen: Nasopharyngeal Swab; Nasopharyngeal(NP) swabs in vial transport medium  Result Value Ref Range Status   SARS Coronavirus 2 by RT PCR NEGATIVE NEGATIVE Final    Comment: (NOTE) SARS-CoV-2 target nucleic acids are NOT DETECTED.  The SARS-CoV-2 RNA is generally detectable in upper respiratory specimens during the acute phase of infection. The lowest concentration of SARS-CoV-2 viral copies this assay can detect is 138 copies/mL. A negative result does not preclude SARS-Cov-2 infection and should not be used as the sole basis for treatment or other patient management decisions. A negative result may occur with  improper specimen collection/handling, submission of specimen other than nasopharyngeal swab, presence of viral mutation(s) within the areas targeted by this assay, and inadequate number of viral copies(<138 copies/mL). A negative result must be combined with clinical observations, patient history, and epidemiological information. The expected result is Negative.  Fact Sheet for Patients:  BloggerCourse.com  Fact Sheet for Healthcare Providers:  SeriousBroker.it  This test is no t yet approved or cleared by  the [employer] and  has been authorized for detection and/or diagnosis of SARS-CoV-2 by FDA under an Emergency Use Authorization (EUA). This EUA will remain  in effect (meaning this test can be used) for the duration of the COVID-19 declaration under Section 564(b)(1) of the Act, 21 U.S.C.section 360bbb-3(b)(1), unless the authorization is terminated  or revoked sooner.       Influenza A by PCR NEGATIVE NEGATIVE Final    Influenza B by PCR NEGATIVE NEGATIVE Final    Comment: (NOTE) The Xpert Xpress SARS-CoV-2/FLU/RSV plus assay is intended as an aid in the diagnosis of influenza from Nasopharyngeal swab specimens and should not be used as a sole basis for treatment. Nasal washings and aspirates are unacceptable for Xpert Xpress SARS-CoV-2/FLU/RSV testing.  Fact Sheet for Patients: BloggerCourse.com  Fact Sheet for Healthcare Providers: SeriousBroker.it  This test is not yet approved or cleared by the [place] FDA and has been authorized for detection and/or diagnosis of SARS-CoV-2 by FDA under an Emergency Use Authorization (EUA). This EUA will remain in effect (meaning this test can be used) for the duration of the COVID-19 declaration under Section 564(b)(1) of the Act, 21 U.S.C. section 360bbb-3(b)(1), unless the authorization is terminated or revoked.  Performed at [facility], [address] [place], [place] [address]   Urine Culture     Status: Abnormal   Collection Time: 08/23/21  4:16 PM   Specimen: Urine, Catheterized  Result Value Ref Range Status   Specimen Description   Final    URINE, CATHETERIZED Performed at [facility], [address] [place], [place] [address]    Special Requests   Final    NONE Performed at [facility], [address], [place], [place] [address]    Culture MULTIPLE SPECIES PRESENT, SUGGEST RECOLLECTION (A)  Final   Report Status 08/25/2021 FINAL  Final     Labs: Basic Metabolic Panel: Recent Labs  Lab 08/21/21 1830 08/22/21 0108 08/23/21 0553 08/24/21 0547 08/25/21 0634 08/26/21 0549  NA 132* 133* 137 136 141 141  K 2.6* 2.9* 3.2* 2.2* 3.1* 3.6  CL 92* 95* 101 103 106 109  CO2 20* 20* 20* 24 28 26   GLUCOSE 100* 112* 109* 86 93 104*  BUN 31* 32* 16 12 8 11   CREATININE 1.40* 1.44* 0.79 0.72 0.68 0.62  CALCIUM 8.0* 7.7* 7.7* 7.2* 7.9* 7.4*  MG 1.3*  --  1.2* 1.5* 1.4* 1.3*   Liver  Function Tests: Recent Labs  Lab 08/21/21 1830  AST 28  ALT 16  ALKPHOS 67  BILITOT 1.2  PROT 6.9  ALBUMIN 3.8   No results for input(s): LIPASE, AMYLASE in the last 168 hours. No results for input(s): AMMONIA in the last 168 hours. CBC: Recent Labs  Lab 08/21/21 1830 08/22/21 0108 08/22/21 0522 08/23/21 0553 08/24/21 0547 08/26/21 0549  WBC 12.5* 13.7* 10.1 7.9 6.6 6.2  NEUTROABS 9.8*  --   --   --   --   --   HGB 14.0 13.6 12.1 12.4 11.6* 10.3*  HCT 40.5 40.9 35.3* 37.4 34.2* 31.5*  MCV 87.3 89.1 88.3 89.0 86.6 90.0  PLT 207 170 164 182 168 129*   Cardiac Enzymes: No results for input(s): CKTOTAL, CKMB, CKMBINDEX, TROPONINI in the last 168 hours. BNP: Invalid input(s): POCBNP CBG: Recent Labs  Lab 08/25/21 0758 08/25/21 1546 08/25/21 2123 08/26/21 0014 08/26/21 0605  GLUCAP 102* 129* 126* 116* 113*    Time coordinating discharge:  36 minutes  Signed:  Catarina Hartshorn, DO [facility] Pager: [phone] 08/26/2021, 11:47 AM

## 2021-08-26 NOTE — Progress Notes (Signed)
Order placed to request records from [facility] in [place], [place] where family reported she had her AVR. Still awaiting records. She has no sternotomy scar and the valve appears to be a prosthesis on echo, family also reports produre was a TAVR. Since tissue valve does not require coumadin for anticoagulation, could use DOAC for her afib, which given recurrent admissions with supratherapeutic INRs and GI bleeding would be ideal for her.   When INR <2 and ok from bleeding standpoint would start eliquis 5mg  bid. If plans for discharge today could have her wait 5 additional days and then start eliquis as an outpatient.    No additional cardiology recs at this time, we will sign off inpatient care.    MD

## 2021-08-26 NOTE — Plan of Care (Signed)
  Problem: Education: Goal: Knowledge of General Education information will improve Description Including pain rating scale, medication(s)/side effects and non-pharmacologic comfort measures Outcome: Progressing   Problem: Health Behavior/Discharge Planning: Goal: Ability to manage health-related needs will improve Outcome: Progressing   

## 2021-08-28 LAB — URINE CULTURE: Culture: >=100000 — AB

## 2021-09-13 ENCOUNTER — Encounter (INDEPENDENT_AMBULATORY_CARE_PROVIDER_SITE_OTHER): Payer: Self-pay | Admitting: *Deleted

## 2021-09-21 ENCOUNTER — Ambulatory Visit (INDEPENDENT_AMBULATORY_CARE_PROVIDER_SITE_OTHER): Payer: Medicare Other | Admitting: Gastroenterology

## 2021-09-21 ENCOUNTER — Encounter (INDEPENDENT_AMBULATORY_CARE_PROVIDER_SITE_OTHER): Payer: Self-pay | Admitting: Gastroenterology

## 2022-06-16 ENCOUNTER — Other Ambulatory Visit: Payer: Self-pay

## 2022-06-16 ENCOUNTER — Encounter (HOSPITAL_COMMUNITY): Payer: Self-pay | Admitting: Emergency Medicine

## 2022-06-16 ENCOUNTER — Emergency Department (HOSPITAL_COMMUNITY)
Admission: EM | Admit: 2022-06-16 | Discharge: 2022-06-16 | Disposition: A | Discharge status: Home / Self Care | Payer: Medicare Other | Attending: Emergency Medicine | Admitting: Emergency Medicine

## 2022-06-16 ENCOUNTER — Emergency Department (HOSPITAL_COMMUNITY): Payer: Medicare Other

## 2022-06-16 DIAGNOSIS — F0390 Unspecified dementia without behavioral disturbance: Secondary | ICD-10-CM | POA: Diagnosis not present

## 2022-06-16 DIAGNOSIS — R0789 Other chest pain: Secondary | ICD-10-CM | POA: Diagnosis not present

## 2022-06-16 DIAGNOSIS — R079 Chest pain, unspecified: Secondary | ICD-10-CM | POA: Diagnosis present

## 2022-06-16 DIAGNOSIS — E119 Type 2 diabetes mellitus without complications: Secondary | ICD-10-CM | POA: Insufficient documentation

## 2022-06-16 DIAGNOSIS — Z7984 Long term (current) use of oral hypoglycemic drugs: Secondary | ICD-10-CM | POA: Diagnosis not present

## 2022-06-16 DIAGNOSIS — Z7901 Long term (current) use of anticoagulants: Secondary | ICD-10-CM | POA: Insufficient documentation

## 2022-06-16 LAB — CBC
HCT: 33.9 % — ABNORMAL LOW (ref 36.0–46.0)
Hemoglobin: 10.9 g/dL — ABNORMAL LOW (ref 12.0–15.0)
MCH: 27.3 pg (ref 26.0–34.0)
MCHC: 32.2 g/dL (ref 30.0–36.0)
MCV: 85 fL (ref 80.0–100.0)
Platelets: 168 10*3/uL (ref 150–400)
RBC: 3.99 MIL/uL (ref 3.87–5.11)
RDW: 12.9 % (ref 11.5–15.5)
WBC: 5.9 10*3/uL (ref 4.0–10.5)
nRBC: 0 % (ref 0.0–0.2)

## 2022-06-16 LAB — BASIC METABOLIC PANEL
Anion gap: 5 (ref 5–15)
BUN: 16 mg/dL (ref 8–23)
CO2: 27 mmol/L (ref 22–32)
Calcium: 8.5 mg/dL — ABNORMAL LOW (ref 8.9–10.3)
Chloride: 105 mmol/L (ref 98–111)
Creatinine, Ser: 1.02 mg/dL — ABNORMAL HIGH (ref 0.44–1.00)
GFR, Estimated: 56 mL/min — ABNORMAL LOW (ref >60)
Glucose, Bld: 243 mg/dL — ABNORMAL HIGH (ref 70–99)
Potassium: 3.7 mmol/L (ref 3.5–5.1)
Sodium: 137 mmol/L (ref 135–145)

## 2022-06-16 LAB — TROPONIN I (HIGH SENSITIVITY)
Troponin I (High Sensitivity): 4 ng/L (ref <18)
Troponin I (High Sensitivity): 5 ng/L (ref <18)

## 2022-06-16 LAB — D-DIMER, QUANTITATIVE: D-Dimer, Quant: 0.75 ug/mL-FEU — ABNORMAL HIGH (ref 0.00–0.50)

## 2022-06-16 NOTE — ED Provider Notes (Signed)
Signout from Dr. Estell Harpin.  81 year old female history of dementia.  Possibly had some chest pain which brought her in to be evaluated although patient currently denies chest pain.  Work-up so far has been unremarkable.  She is pending second troponin.  If negative plan is to discharge back to home to follow-up with PCP. Physical Exam  BP (!) 164/81 (BP Location: Right Arm)   Pulse 81   Temp 98.2 F (36.8 C) (Oral)   Resp 17   Ht 5\' 7"  (1.702 m)   Wt 83.5 kg   SpO2 96%   BMI 28.83 kg/m   Physical Exam  Procedures  Procedures  ED Course / MDM    Medical Decision Making Amount and/or Complexity of Data Reviewed Labs: ordered.   Second troponin back and unremarkable.  Patient denies any chest pain.  Nurse reached out to her caregiver who is coming to pick her up.  Discharge instructions given.       , MD 06/17/22 [phone]

## 2022-06-16 NOTE — ED Provider Notes (Signed)
[facility] EMERGENCY DEPARTMENT Provider Note   CSN: [id number] Arrival date & time: 06/16/22  1307     History  Chief Complaint  Patient presents with   Chest Pain    Kathleen Barnett is a 81 y.o. female.  Patient with a history of diabetes and dementia.  She is sent to the emergency department for some left-sided chest pain that was worse with inspiration  The history is provided by the patient and medical records. No language interpreter was used.  Chest Pain Pain location:  Substernal area Pain quality: aching   Pain radiates to:  Does not radiate Pain severity:  Mild Onset quality:  Gradual Timing:  Intermittent Progression:  Waxing and waning Chronicity:  New Context: breathing   Relieved by:  Nothing Worsened by:  Nothing Ineffective treatments:  None tried Associated symptoms: no abdominal pain, no back pain, no cough, no fatigue and no headache        Home Medications Prior to Admission medications   Medication Sig Start Date End Date Taking? Authorizing Provider  albuterol (VENTOLIN HFA) 108 (90 Base) MCG/ACT inhaler Inhale 2 puffs into the lungs every 6 (six) hours as needed for wheezing or shortness of breath.     [provider]  apixaban (ELIQUIS) 5 MG TABS tablet Take 1 tablet (5 mg total) by mouth 2 (two) times daily. Start 08/31/21 08/31/21   Orson Eva, MD  atorvastatin (LIPITOR) 20 MG tablet Take 20 mg by mouth every evening.     [provider]  docusate sodium (COLACE) 100 MG capsule Take 1 capsule (100 mg total) by mouth 2 (two) times daily. 07/09/21 07/09/22  Johnson, [name], MD  donepezil (ARICEPT) 10 MG tablet Take 10 mg by mouth daily.    [provider]  ferrous sulfate 325 (65 FE) MG tablet Take 1 tablet (325 mg total) by mouth daily with breakfast. 07/09/21   Wynetta Emery, [name], MD  isosorbide mononitrate (IMDUR) 30 MG 24 hr tablet Take 30 mg by mouth daily.     [provider]  memantine (NAMENDA) 10 MG  tablet Take 10 mg by mouth 2 (two) times daily.    [provider]  metFORMIN (GLUCOPHAGE) 500 MG tablet Take 500 mg by mouth 2 (two) times daily with a meal.    [provider]  omeprazole (PRILOSEC) 40 MG capsule Take 1 capsule (40 mg total) by mouth in the morning and at bedtime. 07/09/21   Johnson, [name], MD  QUEtiapine (SEROQUEL) 25 MG tablet Take 1 tablet (25 mg total) by mouth at bedtime. 07/09/21   Johnson, [name], MD  traMADol (ULTRAM) 50 MG tablet Take 50 mg by mouth daily as needed (pain).     [provider]  traZODone (DESYREL) 150 MG tablet Take 1 tablet (150 mg total) by mouth at bedtime. 07/09/21   Murlean Iba, MD      Allergies    Patient has no known allergies.    Review of Systems   Review of Systems  Constitutional:  Negative for appetite change and fatigue.  HENT:  Negative for congestion, ear discharge and sinus pressure.   Eyes:  Negative for discharge.  Respiratory:  Negative for cough.   Cardiovascular:  Positive for chest pain.  Gastrointestinal:  Negative for abdominal pain and diarrhea.  Genitourinary:  Negative for frequency and hematuria.  Musculoskeletal:  Negative for back pain.  Skin:  Negative for rash.  Neurological:  Negative for seizures and headaches.  Psychiatric/Behavioral:  Negative for hallucinations.     Physical Exam Updated Vital Signs BP (!) 164/81 (BP Location: Right Arm)   Pulse 81   Temp 98.2 F (36.8 C) (Oral)   Resp 17   Ht 5\' 7"  (1.702 m)   Wt 83.5 kg   SpO2 96%   BMI 28.83 kg/m  Physical Exam Vitals and nursing note reviewed.  Constitutional:      Appearance: She is well-developed.  HENT:     Head: Normocephalic.     Nose: Nose normal.  Eyes:     General: No scleral icterus.    Conjunctiva/sclera: Conjunctivae normal.  Neck:     Thyroid: No thyromegaly.  Cardiovascular:     Rate and Rhythm: Normal rate and regular rhythm.     Heart sounds: No murmur heard.    No friction  rub. No gallop.  Pulmonary:     Breath sounds: No stridor. No wheezing or rales.  Chest:     Chest wall: No tenderness.  Abdominal:     General: There is no distension.     Tenderness: There is no abdominal tenderness. There is no rebound.  Musculoskeletal:        General: Normal range of motion.     Cervical back: Neck supple.  Lymphadenopathy:     Cervical: No cervical adenopathy.  Skin:    Findings: No erythema or rash.  Neurological:     Mental Status: She is alert and oriented to person, place, and time.     Motor: No abnormal muscle tone.     Coordination: Coordination normal.  Psychiatric:        Behavior: Behavior normal.     ED Results / Procedures / Treatments   Labs (all labs ordered are listed, but only abnormal results are displayed) Labs Reviewed  BASIC METABOLIC PANEL - Abnormal; Notable for the following components:      Result Value   Glucose, Bld 243 (*)    Creatinine, Ser 1.02 (*)    Calcium 8.5 (*)    GFR, Estimated 56 (*)    All other components within normal limits  CBC - Abnormal; Notable for the following components:   Hemoglobin 10.9 (*)    HCT 33.9 (*)    All other components within normal limits  D-DIMER, QUANTITATIVE - Abnormal; Notable for the following components:   D-Dimer, Quant 0.75 (*)    All other components within normal limits  TROPONIN I (HIGH SENSITIVITY)  TROPONIN I (HIGH SENSITIVITY)    EKG EKG Interpretation  Date/Time:  Friday June 16 2022 13:16:44 EDT Ventricular Rate:  83 PR Interval:  116 QRS Duration: 96 QT Interval:  384 QTC Calculation: 452 R Axis:   -36 Text Interpretation: Sinus or ectopic atrial rhythm Atrial premature complex Borderline short PR interval Left axis deviation Low voltage, precordial leads Confirmed by [id number] [phone]) on 06/16/2022 2:03:33 PM  Radiology DG Chest Port 1 View  Result Date: 06/16/2022 CLINICAL DATA:  Chest pain, shortness of breath EXAM: PORTABLE CHEST 1 VIEW COMPARISON:   07/07/2021 FINDINGS: No focal consolidation. No pleural effusion or pneumothorax. Heart and mediastinal contours are unremarkable. Stable cardiomegaly. Prior TAVR. Thoracic aortic atherosclerosis. No acute osseous abnormality. 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 IMPRESSION: 1. No active disease. Electronically Signed   By: 07/09/2021 M.D.   On: 06/16/2022 14:05    Procedures Procedures    Medications Ordered in ED Medications - No data  to display  ED Course/ Medical Decision Making/ A&P                           Medical Decision Making Amount and/or Complexity of Data Reviewed Labs: ordered.  This patient presents to the ED for concern of chest pain, this involves an extensive number of treatment options, and is a complaint that carries with it a high risk of complications and morbidity.  The differential diagnosis includes coronary artery disease, chest wall pain   Co morbidities that complicate the patient evaluation  Dementia   Additional history obtained:  Additional history obtained from ER nurse External records from outside source obtained and reviewed including hospital records   Lab Tests:  I Ordered, and personally interpreted labs.  The pertinent results include: Hemoglobin low at 10.9,   Imaging Studies ordered:  I ordered imaging studies including chest x-ray I independently visualized and interpreted imaging which showed unremarkable I agree with the radiologist interpretation   Cardiac Monitoring: / EKG:  The patient was maintained on a cardiac monitor.  I personally viewed and interpreted the cardiac monitored which showed an underlying rhythm of: Normal sinus rhythm   Consultations Obtained: No consult  Problem List / ED Course / Critical interventions / Medication management  Atypical chest pain and dementia No medicines ordered Reevaluation of the patient after these medicines showed that  the patient improved I have reviewed the patients home medicines and have made adjustments as needed   Social Determinants of Health:  Dementia   Test / Admission - Considered:  No additional test needed  Patient with atypical chest pain and normal troponin.  She will follow-up as an outpatient       Final Clinical Impression(s) / ED Diagnoses Final diagnoses:  Atypical chest pain    Rx / DC Orders ED Discharge Orders     None         Bethann Berkshire, MD 06/16/22 [phone]

## 2022-06-16 NOTE — Discharge Instructions (Addendum)
Follow-up with your family doctor in the next 2 weeks for recheck.  Return if needed

## 2022-06-16 NOTE — ED Triage Notes (Signed)
Pt to the ED with complaints of chest pain that began this morning around 0630. Pt states the pain in on the left and radiates to her back.  Pt has a history of heart valve replacement.

## 2023-07-04 IMAGING — DX DG CHEST 1V PORT
1 series · 1 of 1 positions shown · non-contrast
Comparison: 08/09/2019.

CLINICAL DATA: Weakness.  Shortness of breath.

EXAM:
PORTABLE CHEST 1 VIEW

[chest ap]
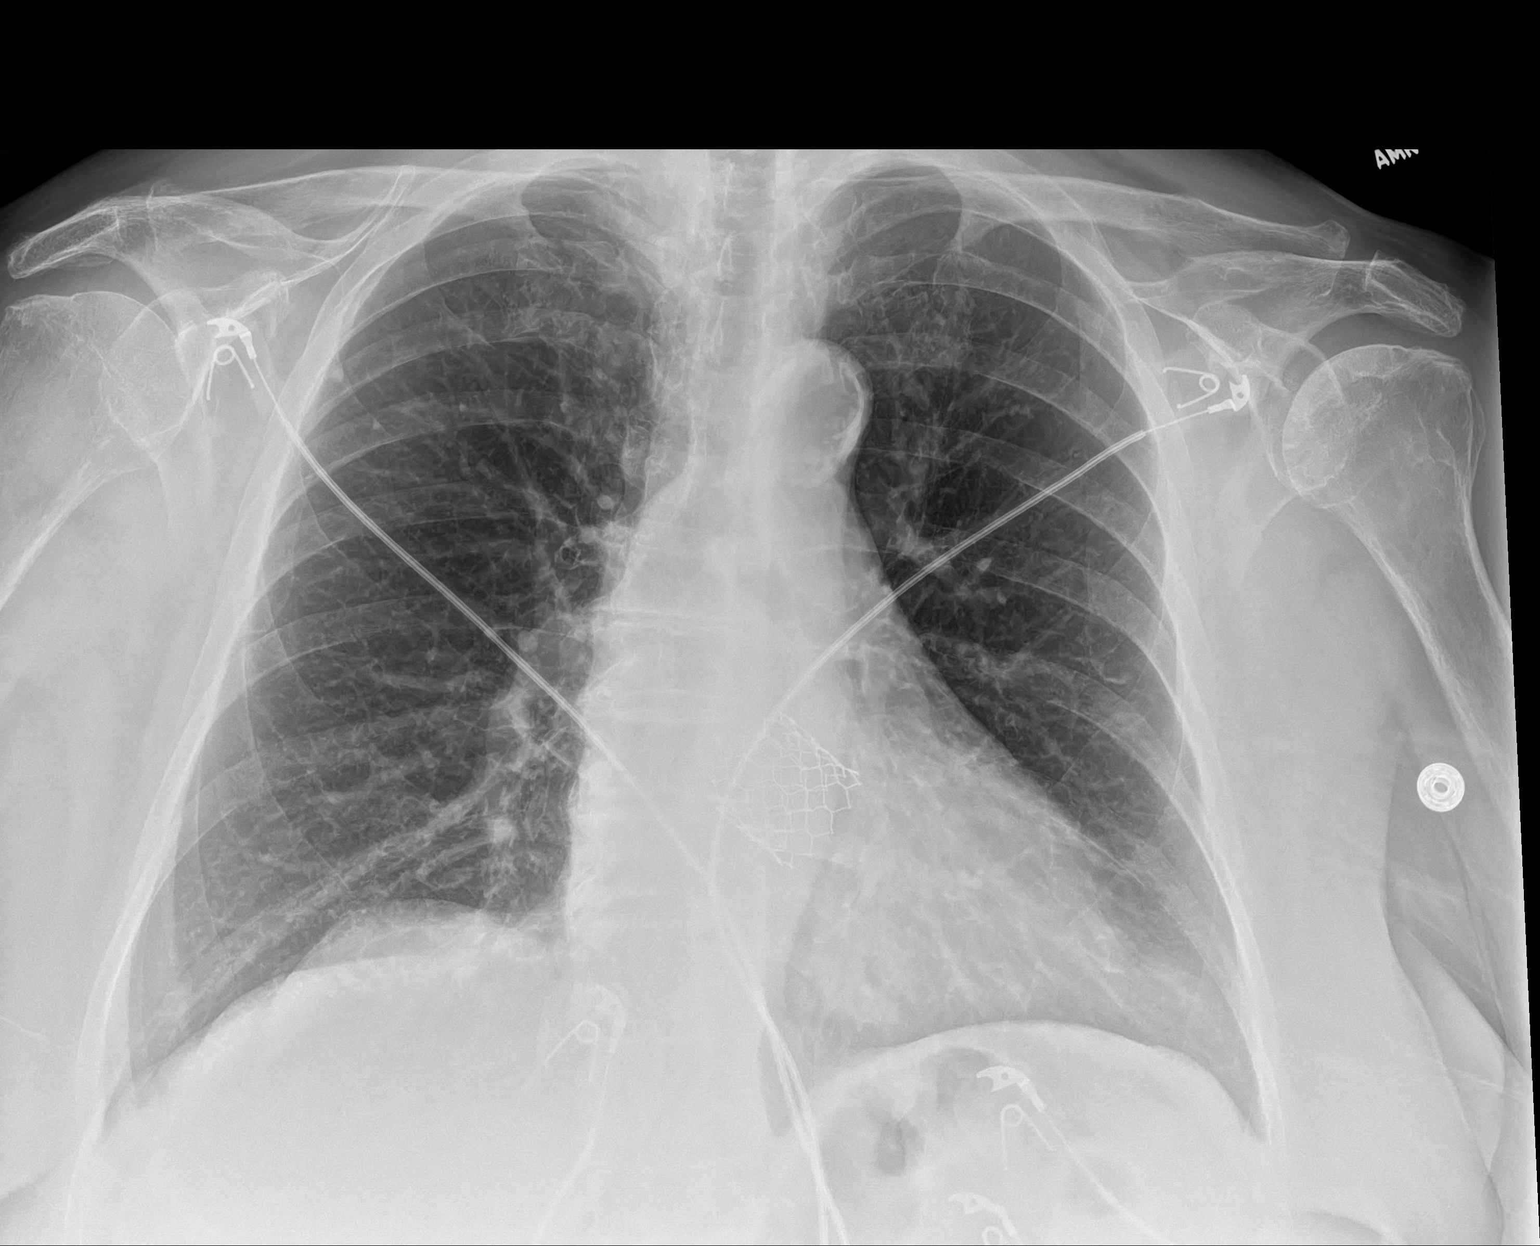

[1 of 1 positions shown; findings below may reference images not displayed]

FINDINGS: Mediastinum and hilar structures normal. Prior cardiac valve
replacement. Mild cardiomegaly. No pulmonary venous congestion.
Calcified pulmonary nodules again noted consistent with granulomas.
No focal infiltrate. No pleural effusion or pneumothorax.
IMPRESSION: Valve replacement. Mild cardiomegaly. No pulmonary venous
congestion. No acute pulmonary disease.

## 2023-07-04 IMAGING — CT CT HEAD W/O CM
3 series · 15 of 47 positions shown, 18 images · non-contrast
Comparison: Brain MRI 09/04/2019.

CLINICAL DATA: Neuro deficit, acute, stroke suspected. Additional
history provided: Patient reports increased weakness for 2 weeks.

EXAM:
CT HEAD WITHOUT CONTRAST
TECHNIQUE: Contiguous axial images were obtained from the base of the skull
through the vertex without intravenous contrast.

[Series 3: head w o · axial · 0.42mm/px · z∈[+13,+153]mm · 9 of 34 slices shown, 12 images]
[im 3/34  brain]
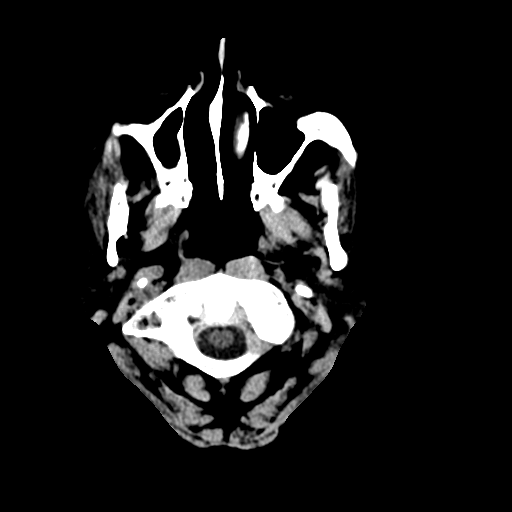
[im 3/34  bone]
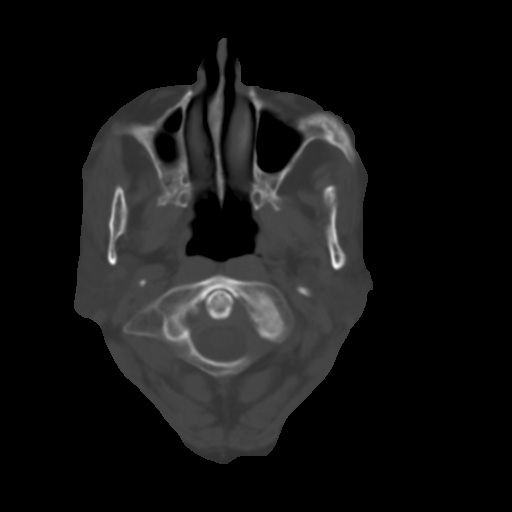
[im 6/34  brain]
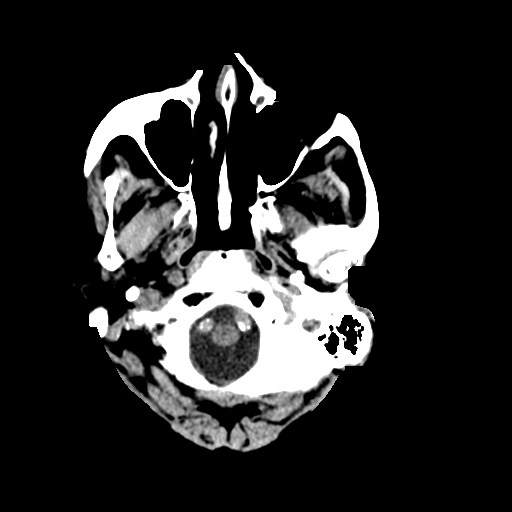
[im 10/34  brain]
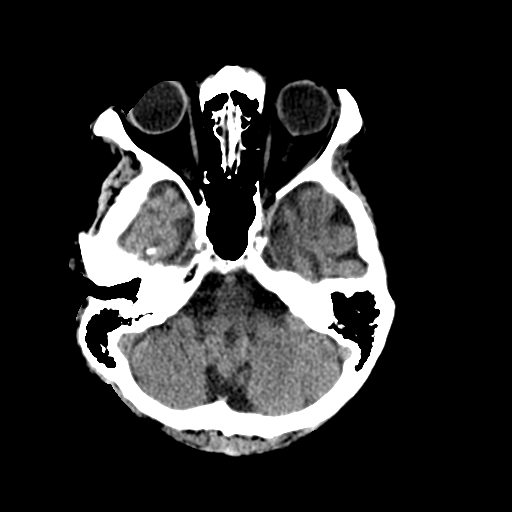
[im 13/34  brain]
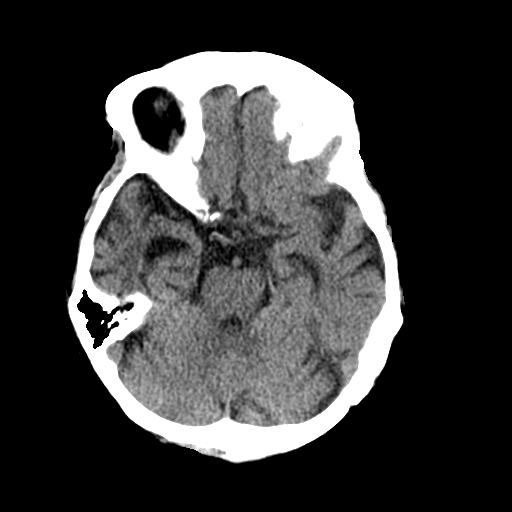
[im 18/34  brain]
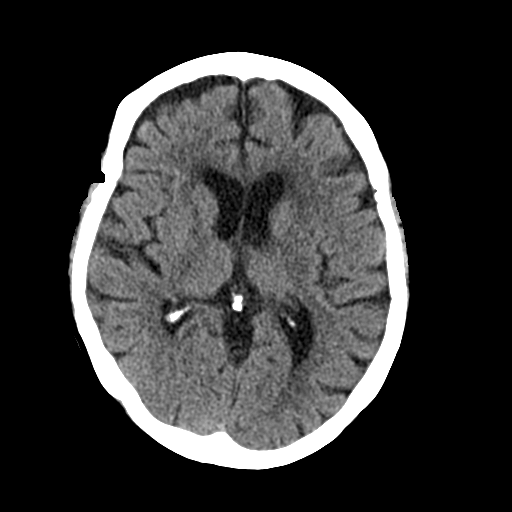
[im 18/34  bone]
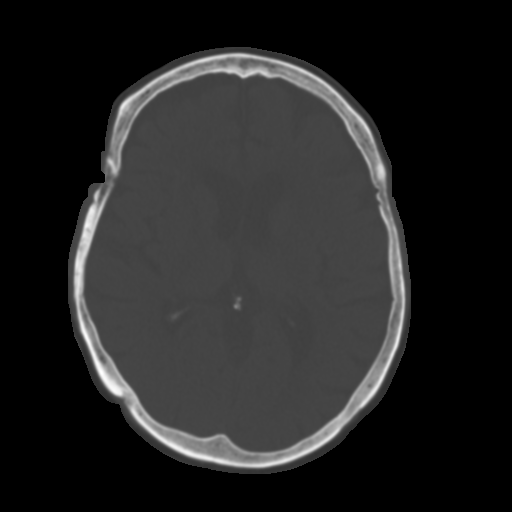
[im 21/34  brain]
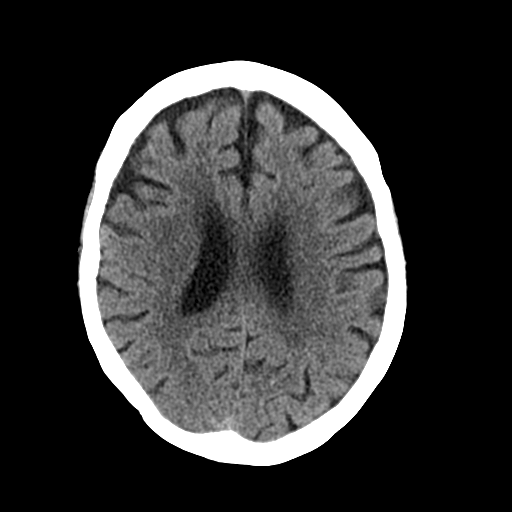
[im 24/34  brain]
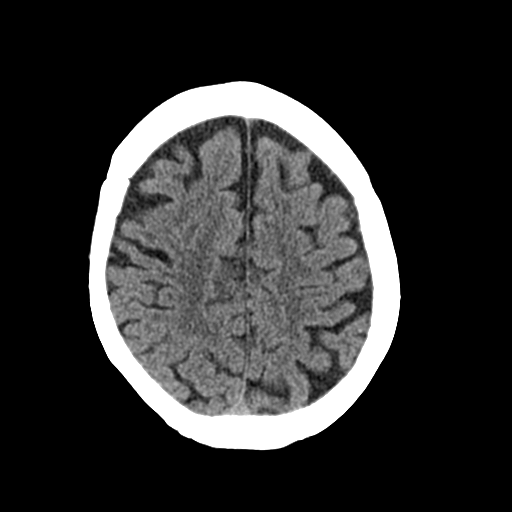
[im 28/34  brain]
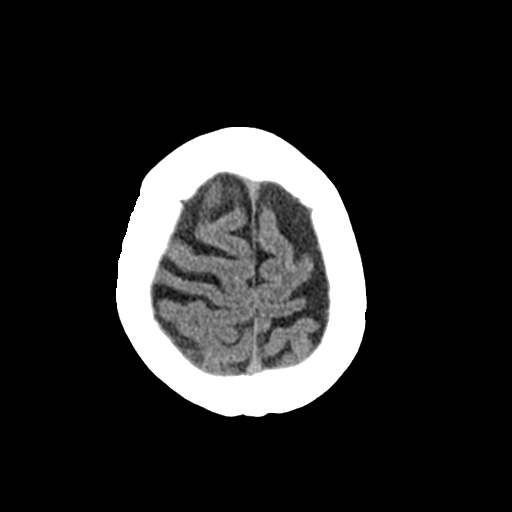
[im 31/34  brain]
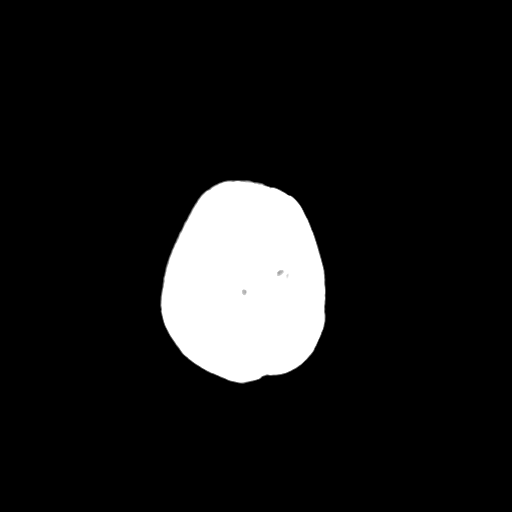
[im 31/34  bone]
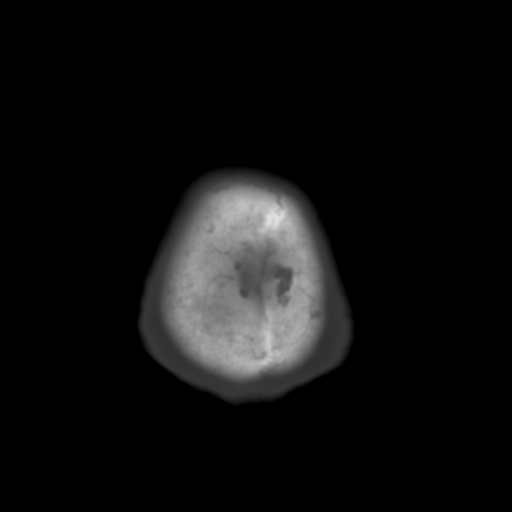

[Series 5: coronal soft · coronal · 0.34mm/px · 3 of 67 slices shown]
[im 23/67  brain]
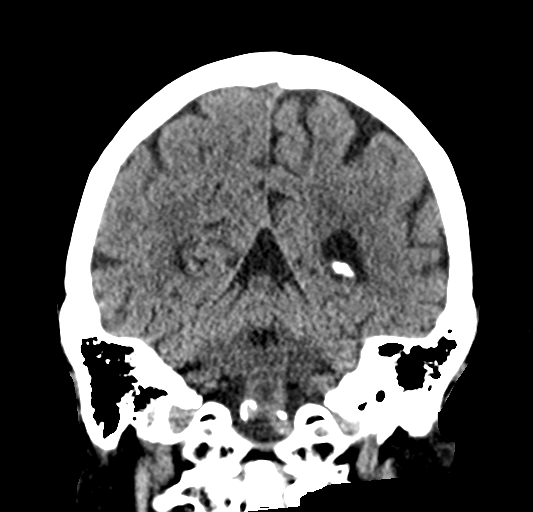
[im 30/67  brain]
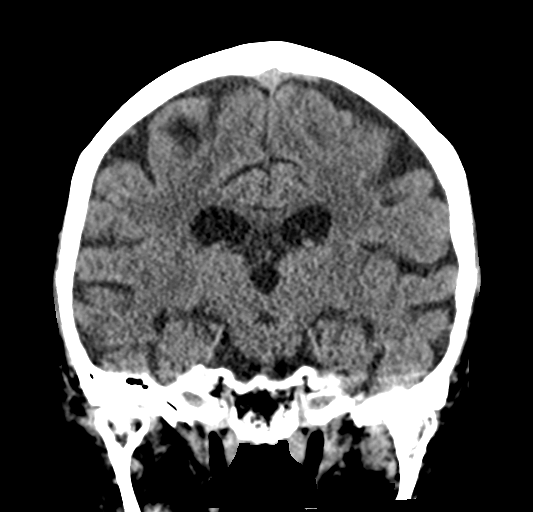
[im 37/67  brain]
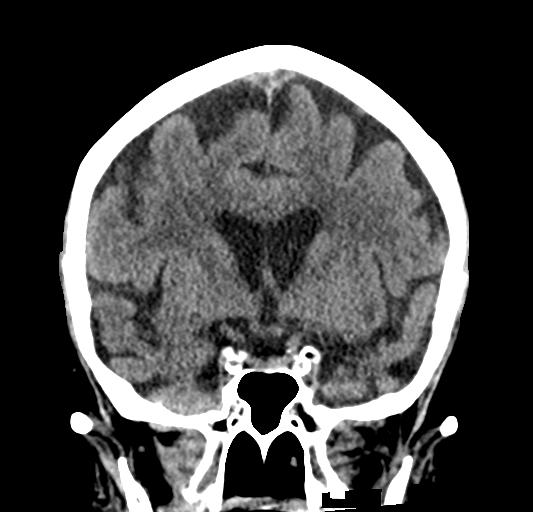

[Series 6: sagittal soft · sagittal · 0.35mm/px · 3 of 57 slices shown]
[im 19/57  brain]
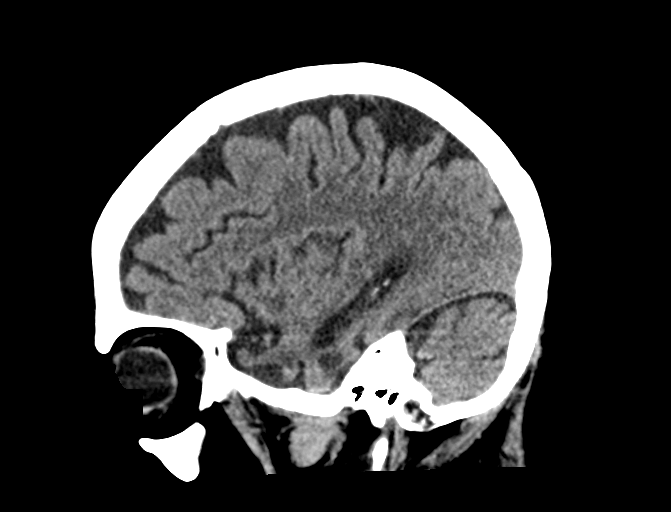
[im 29/57  brain]
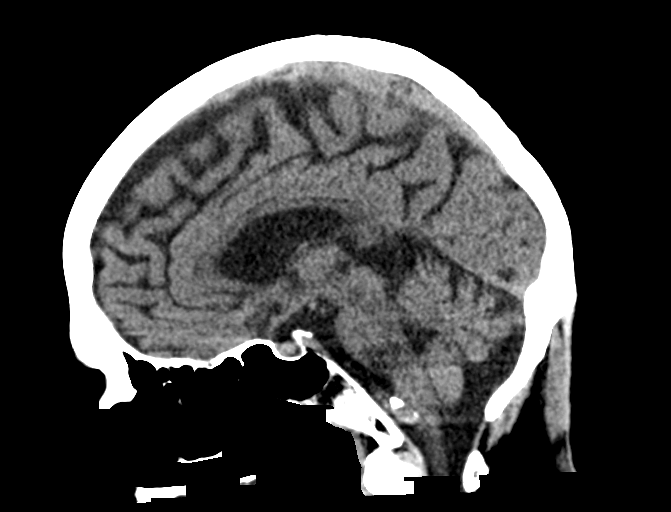
[im 38/57  brain]
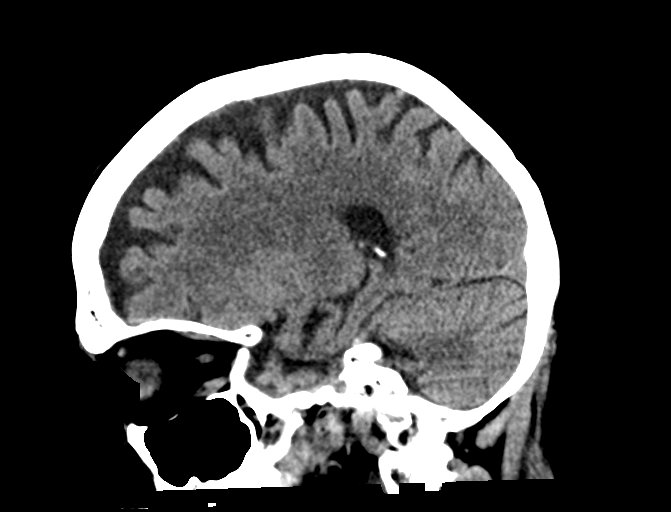

[15 of 47 positions shown; findings below may reference images not displayed]

FINDINGS: Brain:

Mild-to-moderate generalized cerebral atrophy.

Known chronic lacunar infarcts within the bilateral basal ganglia,
better appreciated on the brain MRI of 09/04/2019.

Mild-to-moderate patchy and ill-defined hypoattenuation within the
cerebral white matter, nonspecific but compatible chronic small
vessel ischemic disease.

There is no acute intracranial hemorrhage.

No demarcated cortical infarct.

No extra-axial fluid collection.

No evidence of an intracranial mass.

No midline shift.

Vascular: No hyperdense vessel.  Atherosclerotic calcifications

Skull: Normal. Negative for fracture or focal lesion.

Sinuses/Orbits: Visualized orbits show no acute finding. No
significant paranasal sinus disease at the imaged levels.
IMPRESSION: No evidence of acute intracranial abnormality

Mild-to-moderate generalized cerebral atrophy and cerebral white
matter chronic small vessel ischemic disease.

Known chronic lacunar infarcts in the bilateral basal ganglia,
better appreciated on the brain MRI of 09/04/2019.

## 2024-09-16 ENCOUNTER — Other Ambulatory Visit (HOSPITAL_COMMUNITY): Payer: Self-pay | Admitting: Family Medicine

## 2024-09-16 DIAGNOSIS — Z13820 Encounter for screening for osteoporosis: Secondary | ICD-10-CM
